# Patient Record
Sex: Female | Born: 1950
Health system: Southern US, Community
[De-identification: ages and names within clinical notes are randomized; demographics above are authoritative.]

## PROBLEM LIST (undated history)

## (undated) DIAGNOSIS — D696 Thrombocytopenia, unspecified: Secondary | ICD-10-CM

## (undated) DIAGNOSIS — M51379 Other intervertebral disc degeneration, lumbosacral region without mention of lumbar back pain or lower extremity pain: Secondary | ICD-10-CM

## (undated) DIAGNOSIS — M5136 Other intervertebral disc degeneration, lumbar region: Secondary | ICD-10-CM

## (undated) DIAGNOSIS — E119 Type 2 diabetes mellitus without complications: Secondary | ICD-10-CM

## (undated) DIAGNOSIS — I10 Essential (primary) hypertension: Secondary | ICD-10-CM

## (undated) DIAGNOSIS — M5137 Other intervertebral disc degeneration, lumbosacral region: Secondary | ICD-10-CM

## (undated) HISTORY — DX: Essential (primary) hypertension: I10

## (undated) HISTORY — PX: WRIST SURGERY: SHX841

## (undated) HISTORY — DX: Other intervertebral disc degeneration, lumbosacral region without mention of lumbar back pain or lower extremity pain: M51.379

## (undated) HISTORY — DX: Other intervertebral disc degeneration, lumbosacral region: M51.37

## (undated) HISTORY — DX: Thrombocytopenia, unspecified: D69.6

## (undated) HISTORY — PX: CARPAL TUNNEL RELEASE: SHX101

## (undated) HISTORY — DX: Type 2 diabetes mellitus without complications: E11.9

---
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
## 1976-08-11 DIAGNOSIS — N809 Endometriosis, unspecified: Secondary | ICD-10-CM

## 1976-08-11 HISTORY — DX: Endometriosis, unspecified: N80.9

## 1980-08-11 DIAGNOSIS — S82899A Other fracture of unspecified lower leg, initial encounter for closed fracture: Secondary | ICD-10-CM

## 1980-08-11 HISTORY — DX: Other fracture of unspecified lower leg, initial encounter for closed fracture: S82.899A

## 1997-08-11 DIAGNOSIS — S62109A Fracture of unspecified carpal bone, unspecified wrist, initial encounter for closed fracture: Secondary | ICD-10-CM

## 1997-08-11 HISTORY — DX: Fracture of unspecified carpal bone, unspecified wrist, initial encounter for closed fracture: S62.109A

## 1998-01-18 ENCOUNTER — Other Ambulatory Visit: Admission: RE | Admit: 1998-01-18 | Discharge: 1998-01-18 | Payer: Self-pay | Admitting: Obstetrics and Gynecology

## 1998-02-13 ENCOUNTER — Inpatient Hospital Stay (HOSPITAL_COMMUNITY): Admission: RE | Admit: 1998-02-13 | Discharge: 1998-02-16 | Payer: Self-pay | Admitting: Obstetrics and Gynecology

## 1998-08-11 DIAGNOSIS — G5601 Carpal tunnel syndrome, right upper limb: Secondary | ICD-10-CM

## 1998-08-11 HISTORY — DX: Carpal tunnel syndrome, right upper limb: G56.01

## 1998-10-11 ENCOUNTER — Other Ambulatory Visit: Admission: RE | Admit: 1998-10-11 | Discharge: 1998-10-11 | Payer: Self-pay | Admitting: Obstetrics and Gynecology

## 1999-06-30 ENCOUNTER — Encounter: Payer: Self-pay | Admitting: Emergency Medicine

## 1999-06-30 ENCOUNTER — Emergency Department (HOSPITAL_COMMUNITY): Admission: EM | Admit: 1999-06-30 | Discharge: 1999-06-30 | Payer: Self-pay | Admitting: Emergency Medicine

## 1999-09-20 ENCOUNTER — Encounter: Admission: RE | Admit: 1999-09-20 | Discharge: 1999-12-19 | Payer: Self-pay | Admitting: Family Medicine

## 1999-09-20 ENCOUNTER — Encounter: Admission: RE | Admit: 1999-09-20 | Discharge: 1999-09-20 | Payer: Self-pay | Admitting: Family Medicine

## 1999-09-20 ENCOUNTER — Encounter: Payer: Self-pay | Admitting: Family Medicine

## 1999-11-20 ENCOUNTER — Other Ambulatory Visit: Admission: RE | Admit: 1999-11-20 | Discharge: 1999-11-20 | Payer: Self-pay | Admitting: Obstetrics and Gynecology

## 2012-01-23 ENCOUNTER — Ambulatory Visit
Admission: RE | Admit: 2012-01-23 | Discharge: 2012-01-23 | Disposition: A | Discharge status: Home / Self Care | Payer: No Typology Code available for payment source | Source: Ambulatory Visit | Attending: Family Medicine | Admitting: Family Medicine

## 2012-01-23 ENCOUNTER — Other Ambulatory Visit: Payer: Self-pay | Admitting: Family Medicine

## 2012-01-23 DIAGNOSIS — R7611 Nonspecific reaction to tuberculin skin test without active tuberculosis: Secondary | ICD-10-CM

## 2013-03-02 ENCOUNTER — Other Ambulatory Visit: Payer: Self-pay | Admitting: Orthopedic Surgery

## 2013-03-02 DIAGNOSIS — M545 Low back pain: Secondary | ICD-10-CM

## 2013-03-09 ENCOUNTER — Other Ambulatory Visit: Payer: Self-pay

## 2013-05-03 ENCOUNTER — Other Ambulatory Visit: Payer: Self-pay | Admitting: Family Medicine

## 2013-05-03 ENCOUNTER — Ambulatory Visit
Admission: RE | Admit: 2013-05-03 | Discharge: 2013-05-03 | Disposition: A | Discharge status: Home / Self Care | Payer: BC Managed Care – PPO | Source: Ambulatory Visit | Attending: Family Medicine | Admitting: Family Medicine

## 2013-05-03 DIAGNOSIS — R7611 Nonspecific reaction to tuberculin skin test without active tuberculosis: Secondary | ICD-10-CM

## 2013-10-21 ENCOUNTER — Encounter (INDEPENDENT_AMBULATORY_CARE_PROVIDER_SITE_OTHER): Payer: BC Managed Care – PPO | Admitting: Ophthalmology

## 2013-10-21 DIAGNOSIS — H35039 Hypertensive retinopathy, unspecified eye: Secondary | ICD-10-CM

## 2013-10-21 DIAGNOSIS — H43819 Vitreous degeneration, unspecified eye: Secondary | ICD-10-CM

## 2013-10-21 DIAGNOSIS — E1165 Type 2 diabetes mellitus with hyperglycemia: Secondary | ICD-10-CM

## 2013-10-21 DIAGNOSIS — E1139 Type 2 diabetes mellitus with other diabetic ophthalmic complication: Secondary | ICD-10-CM

## 2013-10-21 DIAGNOSIS — E11319 Type 2 diabetes mellitus with unspecified diabetic retinopathy without macular edema: Secondary | ICD-10-CM

## 2013-10-21 DIAGNOSIS — H348192 Central retinal vein occlusion, unspecified eye, stable: Secondary | ICD-10-CM

## 2013-10-21 DIAGNOSIS — I10 Essential (primary) hypertension: Secondary | ICD-10-CM

## 2013-11-25 ENCOUNTER — Encounter (INDEPENDENT_AMBULATORY_CARE_PROVIDER_SITE_OTHER): Payer: BC Managed Care – PPO | Admitting: Ophthalmology

## 2013-11-25 DIAGNOSIS — H348192 Central retinal vein occlusion, unspecified eye, stable: Secondary | ICD-10-CM

## 2013-11-25 DIAGNOSIS — E11319 Type 2 diabetes mellitus with unspecified diabetic retinopathy without macular edema: Secondary | ICD-10-CM

## 2013-11-25 DIAGNOSIS — E1139 Type 2 diabetes mellitus with other diabetic ophthalmic complication: Secondary | ICD-10-CM

## 2013-11-25 DIAGNOSIS — H35039 Hypertensive retinopathy, unspecified eye: Secondary | ICD-10-CM

## 2013-11-25 DIAGNOSIS — H43819 Vitreous degeneration, unspecified eye: Secondary | ICD-10-CM

## 2013-11-25 DIAGNOSIS — E1165 Type 2 diabetes mellitus with hyperglycemia: Secondary | ICD-10-CM

## 2013-11-25 DIAGNOSIS — I10 Essential (primary) hypertension: Secondary | ICD-10-CM

## 2013-12-23 ENCOUNTER — Encounter (INDEPENDENT_AMBULATORY_CARE_PROVIDER_SITE_OTHER): Payer: BC Managed Care – PPO | Admitting: Ophthalmology

## 2013-12-23 DIAGNOSIS — H348192 Central retinal vein occlusion, unspecified eye, stable: Secondary | ICD-10-CM

## 2013-12-23 DIAGNOSIS — I10 Essential (primary) hypertension: Secondary | ICD-10-CM

## 2013-12-23 DIAGNOSIS — E11319 Type 2 diabetes mellitus with unspecified diabetic retinopathy without macular edema: Secondary | ICD-10-CM

## 2013-12-23 DIAGNOSIS — H43819 Vitreous degeneration, unspecified eye: Secondary | ICD-10-CM

## 2013-12-23 DIAGNOSIS — E1165 Type 2 diabetes mellitus with hyperglycemia: Secondary | ICD-10-CM

## 2013-12-23 DIAGNOSIS — H35039 Hypertensive retinopathy, unspecified eye: Secondary | ICD-10-CM

## 2013-12-23 DIAGNOSIS — E1139 Type 2 diabetes mellitus with other diabetic ophthalmic complication: Secondary | ICD-10-CM

## 2014-01-30 ENCOUNTER — Encounter (INDEPENDENT_AMBULATORY_CARE_PROVIDER_SITE_OTHER): Payer: BC Managed Care – PPO | Admitting: Ophthalmology

## 2014-01-30 DIAGNOSIS — I10 Essential (primary) hypertension: Secondary | ICD-10-CM

## 2014-01-30 DIAGNOSIS — E11319 Type 2 diabetes mellitus with unspecified diabetic retinopathy without macular edema: Secondary | ICD-10-CM

## 2014-01-30 DIAGNOSIS — E1165 Type 2 diabetes mellitus with hyperglycemia: Secondary | ICD-10-CM

## 2014-01-30 DIAGNOSIS — H348192 Central retinal vein occlusion, unspecified eye, stable: Secondary | ICD-10-CM

## 2014-01-30 DIAGNOSIS — H43819 Vitreous degeneration, unspecified eye: Secondary | ICD-10-CM

## 2014-01-30 DIAGNOSIS — H35039 Hypertensive retinopathy, unspecified eye: Secondary | ICD-10-CM

## 2014-01-30 DIAGNOSIS — E1139 Type 2 diabetes mellitus with other diabetic ophthalmic complication: Secondary | ICD-10-CM

## 2014-03-20 ENCOUNTER — Encounter (INDEPENDENT_AMBULATORY_CARE_PROVIDER_SITE_OTHER): Payer: BC Managed Care – PPO | Admitting: Ophthalmology

## 2014-03-20 DIAGNOSIS — E1165 Type 2 diabetes mellitus with hyperglycemia: Secondary | ICD-10-CM

## 2014-03-20 DIAGNOSIS — E1139 Type 2 diabetes mellitus with other diabetic ophthalmic complication: Secondary | ICD-10-CM

## 2014-03-20 DIAGNOSIS — E11319 Type 2 diabetes mellitus with unspecified diabetic retinopathy without macular edema: Secondary | ICD-10-CM

## 2014-03-20 DIAGNOSIS — H43819 Vitreous degeneration, unspecified eye: Secondary | ICD-10-CM

## 2014-03-20 DIAGNOSIS — H35039 Hypertensive retinopathy, unspecified eye: Secondary | ICD-10-CM

## 2014-03-20 DIAGNOSIS — I10 Essential (primary) hypertension: Secondary | ICD-10-CM

## 2014-03-20 DIAGNOSIS — H348192 Central retinal vein occlusion, unspecified eye, stable: Secondary | ICD-10-CM

## 2014-05-22 ENCOUNTER — Encounter (INDEPENDENT_AMBULATORY_CARE_PROVIDER_SITE_OTHER): Payer: BC Managed Care – PPO | Admitting: Ophthalmology

## 2014-05-22 DIAGNOSIS — I10 Essential (primary) hypertension: Secondary | ICD-10-CM

## 2014-05-22 DIAGNOSIS — E11321 Type 2 diabetes mellitus with mild nonproliferative diabetic retinopathy with macular edema: Secondary | ICD-10-CM

## 2014-05-22 DIAGNOSIS — H34811 Central retinal vein occlusion, right eye: Secondary | ICD-10-CM

## 2014-05-22 DIAGNOSIS — H43813 Vitreous degeneration, bilateral: Secondary | ICD-10-CM

## 2014-05-22 DIAGNOSIS — H35033 Hypertensive retinopathy, bilateral: Secondary | ICD-10-CM

## 2014-05-22 DIAGNOSIS — E11329 Type 2 diabetes mellitus with mild nonproliferative diabetic retinopathy without macular edema: Secondary | ICD-10-CM

## 2014-05-22 DIAGNOSIS — E11311 Type 2 diabetes mellitus with unspecified diabetic retinopathy with macular edema: Secondary | ICD-10-CM

## 2014-08-21 ENCOUNTER — Encounter (INDEPENDENT_AMBULATORY_CARE_PROVIDER_SITE_OTHER): Payer: BC Managed Care – PPO | Admitting: Ophthalmology

## 2014-08-21 DIAGNOSIS — H43813 Vitreous degeneration, bilateral: Secondary | ICD-10-CM

## 2014-08-21 DIAGNOSIS — E11319 Type 2 diabetes mellitus with unspecified diabetic retinopathy without macular edema: Secondary | ICD-10-CM

## 2014-08-21 DIAGNOSIS — I10 Essential (primary) hypertension: Secondary | ICD-10-CM

## 2014-08-21 DIAGNOSIS — E11329 Type 2 diabetes mellitus with mild nonproliferative diabetic retinopathy without macular edema: Secondary | ICD-10-CM

## 2014-08-21 DIAGNOSIS — H35033 Hypertensive retinopathy, bilateral: Secondary | ICD-10-CM

## 2014-08-21 DIAGNOSIS — H34811 Central retinal vein occlusion, right eye: Secondary | ICD-10-CM

## 2014-10-30 ENCOUNTER — Encounter (INDEPENDENT_AMBULATORY_CARE_PROVIDER_SITE_OTHER): Payer: BC Managed Care – PPO | Admitting: Ophthalmology

## 2014-10-30 DIAGNOSIS — H35033 Hypertensive retinopathy, bilateral: Secondary | ICD-10-CM

## 2014-10-30 DIAGNOSIS — E11329 Type 2 diabetes mellitus with mild nonproliferative diabetic retinopathy without macular edema: Secondary | ICD-10-CM | POA: Diagnosis not present

## 2014-10-30 DIAGNOSIS — H43813 Vitreous degeneration, bilateral: Secondary | ICD-10-CM

## 2014-10-30 DIAGNOSIS — I10 Essential (primary) hypertension: Secondary | ICD-10-CM

## 2014-10-30 DIAGNOSIS — E11319 Type 2 diabetes mellitus with unspecified diabetic retinopathy without macular edema: Secondary | ICD-10-CM

## 2014-10-30 DIAGNOSIS — H34811 Central retinal vein occlusion, right eye: Secondary | ICD-10-CM | POA: Diagnosis not present

## 2014-11-09 ENCOUNTER — Other Ambulatory Visit: Payer: Self-pay | Admitting: Family Medicine

## 2014-11-09 ENCOUNTER — Ambulatory Visit
Admission: RE | Admit: 2014-11-09 | Discharge: 2014-11-09 | Disposition: A | Discharge status: Home / Self Care | Payer: BC Managed Care – PPO | Source: Ambulatory Visit | Attending: Family Medicine | Admitting: Family Medicine

## 2014-11-09 DIAGNOSIS — R7611 Nonspecific reaction to tuberculin skin test without active tuberculosis: Secondary | ICD-10-CM

## 2015-01-22 ENCOUNTER — Encounter (INDEPENDENT_AMBULATORY_CARE_PROVIDER_SITE_OTHER): Payer: BC Managed Care – PPO | Admitting: Ophthalmology

## 2015-01-25 ENCOUNTER — Encounter (INDEPENDENT_AMBULATORY_CARE_PROVIDER_SITE_OTHER): Payer: BC Managed Care – PPO | Admitting: Ophthalmology

## 2015-01-26 ENCOUNTER — Encounter (INDEPENDENT_AMBULATORY_CARE_PROVIDER_SITE_OTHER): Payer: BC Managed Care – PPO | Admitting: Ophthalmology

## 2015-01-26 DIAGNOSIS — I10 Essential (primary) hypertension: Secondary | ICD-10-CM | POA: Diagnosis not present

## 2015-01-26 DIAGNOSIS — H43813 Vitreous degeneration, bilateral: Secondary | ICD-10-CM | POA: Diagnosis not present

## 2015-01-26 DIAGNOSIS — E11319 Type 2 diabetes mellitus with unspecified diabetic retinopathy without macular edema: Secondary | ICD-10-CM

## 2015-01-26 DIAGNOSIS — E11329 Type 2 diabetes mellitus with mild nonproliferative diabetic retinopathy without macular edema: Secondary | ICD-10-CM | POA: Diagnosis not present

## 2015-01-26 DIAGNOSIS — H34811 Central retinal vein occlusion, right eye: Secondary | ICD-10-CM

## 2015-01-26 DIAGNOSIS — H35033 Hypertensive retinopathy, bilateral: Secondary | ICD-10-CM | POA: Diagnosis not present

## 2015-06-04 ENCOUNTER — Encounter (INDEPENDENT_AMBULATORY_CARE_PROVIDER_SITE_OTHER): Payer: BC Managed Care – PPO | Admitting: Ophthalmology

## 2015-06-11 ENCOUNTER — Encounter (INDEPENDENT_AMBULATORY_CARE_PROVIDER_SITE_OTHER): Payer: BC Managed Care – PPO | Admitting: Ophthalmology

## 2015-06-11 DIAGNOSIS — I10 Essential (primary) hypertension: Secondary | ICD-10-CM

## 2015-06-11 DIAGNOSIS — E113293 Type 2 diabetes mellitus with mild nonproliferative diabetic retinopathy without macular edema, bilateral: Secondary | ICD-10-CM | POA: Diagnosis not present

## 2015-06-11 DIAGNOSIS — H348311 Tributary (branch) retinal vein occlusion, right eye, with retinal neovascularization: Secondary | ICD-10-CM

## 2015-06-11 DIAGNOSIS — E11319 Type 2 diabetes mellitus with unspecified diabetic retinopathy without macular edema: Secondary | ICD-10-CM | POA: Diagnosis not present

## 2015-06-11 DIAGNOSIS — H35033 Hypertensive retinopathy, bilateral: Secondary | ICD-10-CM | POA: Diagnosis not present

## 2015-10-15 ENCOUNTER — Encounter (INDEPENDENT_AMBULATORY_CARE_PROVIDER_SITE_OTHER): Payer: BC Managed Care – PPO | Admitting: Ophthalmology

## 2015-10-15 DIAGNOSIS — E113391 Type 2 diabetes mellitus with moderate nonproliferative diabetic retinopathy without macular edema, right eye: Secondary | ICD-10-CM | POA: Diagnosis not present

## 2015-10-15 DIAGNOSIS — H348112 Central retinal vein occlusion, right eye, stable: Secondary | ICD-10-CM | POA: Diagnosis not present

## 2015-10-15 DIAGNOSIS — E113292 Type 2 diabetes mellitus with mild nonproliferative diabetic retinopathy without macular edema, left eye: Secondary | ICD-10-CM | POA: Diagnosis not present

## 2015-10-15 DIAGNOSIS — E11319 Type 2 diabetes mellitus with unspecified diabetic retinopathy without macular edema: Secondary | ICD-10-CM | POA: Diagnosis not present

## 2015-12-18 ENCOUNTER — Other Ambulatory Visit: Payer: Self-pay | Admitting: Family Medicine

## 2015-12-18 ENCOUNTER — Ambulatory Visit
Admission: RE | Admit: 2015-12-18 | Discharge: 2015-12-18 | Disposition: A | Discharge status: Home / Self Care | Payer: BC Managed Care – PPO | Source: Ambulatory Visit | Attending: Family Medicine | Admitting: Family Medicine

## 2015-12-18 DIAGNOSIS — E1121 Type 2 diabetes mellitus with diabetic nephropathy: Secondary | ICD-10-CM | POA: Diagnosis not present

## 2015-12-18 DIAGNOSIS — I129 Hypertensive chronic kidney disease with stage 1 through stage 4 chronic kidney disease, or unspecified chronic kidney disease: Secondary | ICD-10-CM | POA: Diagnosis not present

## 2015-12-18 DIAGNOSIS — Z7984 Long term (current) use of oral hypoglycemic drugs: Secondary | ICD-10-CM | POA: Diagnosis not present

## 2015-12-18 DIAGNOSIS — R7611 Nonspecific reaction to tuberculin skin test without active tuberculosis: Secondary | ICD-10-CM

## 2015-12-18 DIAGNOSIS — J309 Allergic rhinitis, unspecified: Secondary | ICD-10-CM | POA: Diagnosis not present

## 2015-12-18 DIAGNOSIS — R918 Other nonspecific abnormal finding of lung field: Secondary | ICD-10-CM | POA: Diagnosis not present

## 2015-12-18 DIAGNOSIS — E669 Obesity, unspecified: Secondary | ICD-10-CM | POA: Diagnosis not present

## 2015-12-18 DIAGNOSIS — E11319 Type 2 diabetes mellitus with unspecified diabetic retinopathy without macular edema: Secondary | ICD-10-CM | POA: Diagnosis not present

## 2015-12-18 DIAGNOSIS — N181 Chronic kidney disease, stage 1: Secondary | ICD-10-CM | POA: Diagnosis not present

## 2015-12-18 DIAGNOSIS — E782 Mixed hyperlipidemia: Secondary | ICD-10-CM | POA: Diagnosis not present

## 2015-12-18 DIAGNOSIS — Z6830 Body mass index (BMI) 30.0-30.9, adult: Secondary | ICD-10-CM | POA: Diagnosis not present

## 2015-12-26 ENCOUNTER — Other Ambulatory Visit: Payer: Self-pay | Admitting: Family Medicine

## 2015-12-26 ENCOUNTER — Ambulatory Visit
Admission: RE | Admit: 2015-12-26 | Discharge: 2015-12-26 | Disposition: A | Discharge status: Home / Self Care | Payer: Medicare Other | Source: Ambulatory Visit | Attending: Family Medicine | Admitting: Family Medicine

## 2015-12-26 DIAGNOSIS — R222 Localized swelling, mass and lump, trunk: Secondary | ICD-10-CM

## 2015-12-26 DIAGNOSIS — R918 Other nonspecific abnormal finding of lung field: Secondary | ICD-10-CM | POA: Diagnosis not present

## 2016-02-04 DIAGNOSIS — H6501 Acute serous otitis media, right ear: Secondary | ICD-10-CM | POA: Diagnosis not present

## 2016-02-04 DIAGNOSIS — H7292 Unspecified perforation of tympanic membrane, left ear: Secondary | ICD-10-CM | POA: Diagnosis not present

## 2016-03-24 ENCOUNTER — Encounter (INDEPENDENT_AMBULATORY_CARE_PROVIDER_SITE_OTHER): Payer: Medicare Other | Admitting: Ophthalmology

## 2016-03-24 DIAGNOSIS — H43813 Vitreous degeneration, bilateral: Secondary | ICD-10-CM

## 2016-03-24 DIAGNOSIS — E113393 Type 2 diabetes mellitus with moderate nonproliferative diabetic retinopathy without macular edema, bilateral: Secondary | ICD-10-CM

## 2016-03-24 DIAGNOSIS — H348112 Central retinal vein occlusion, right eye, stable: Secondary | ICD-10-CM | POA: Diagnosis not present

## 2016-03-24 DIAGNOSIS — H35033 Hypertensive retinopathy, bilateral: Secondary | ICD-10-CM | POA: Diagnosis not present

## 2016-03-24 DIAGNOSIS — I10 Essential (primary) hypertension: Secondary | ICD-10-CM

## 2016-03-24 DIAGNOSIS — E11319 Type 2 diabetes mellitus with unspecified diabetic retinopathy without macular edema: Secondary | ICD-10-CM | POA: Diagnosis not present

## 2016-04-25 DIAGNOSIS — Z803 Family history of malignant neoplasm of breast: Secondary | ICD-10-CM | POA: Diagnosis not present

## 2016-04-25 DIAGNOSIS — Z1231 Encounter for screening mammogram for malignant neoplasm of breast: Secondary | ICD-10-CM | POA: Diagnosis not present

## 2016-06-11 DIAGNOSIS — Z23 Encounter for immunization: Secondary | ICD-10-CM | POA: Diagnosis not present

## 2016-08-18 DIAGNOSIS — M25561 Pain in right knee: Secondary | ICD-10-CM | POA: Diagnosis not present

## 2016-08-22 DIAGNOSIS — M25561 Pain in right knee: Secondary | ICD-10-CM | POA: Diagnosis not present

## 2016-08-25 ENCOUNTER — Encounter (INDEPENDENT_AMBULATORY_CARE_PROVIDER_SITE_OTHER): Payer: Medicare Other | Admitting: Ophthalmology

## 2016-08-25 DIAGNOSIS — M25561 Pain in right knee: Secondary | ICD-10-CM | POA: Diagnosis not present

## 2016-09-03 DIAGNOSIS — M5136 Other intervertebral disc degeneration, lumbar region: Secondary | ICD-10-CM | POA: Diagnosis not present

## 2016-09-04 DIAGNOSIS — M5136 Other intervertebral disc degeneration, lumbar region: Secondary | ICD-10-CM | POA: Diagnosis not present

## 2016-09-24 DIAGNOSIS — M25561 Pain in right knee: Secondary | ICD-10-CM | POA: Diagnosis not present

## 2016-10-22 DIAGNOSIS — R262 Difficulty in walking, not elsewhere classified: Secondary | ICD-10-CM | POA: Diagnosis not present

## 2016-10-22 DIAGNOSIS — M25561 Pain in right knee: Secondary | ICD-10-CM | POA: Diagnosis not present

## 2016-10-28 DIAGNOSIS — M25561 Pain in right knee: Secondary | ICD-10-CM | POA: Diagnosis not present

## 2016-10-28 DIAGNOSIS — R262 Difficulty in walking, not elsewhere classified: Secondary | ICD-10-CM | POA: Diagnosis not present

## 2016-10-30 DIAGNOSIS — R262 Difficulty in walking, not elsewhere classified: Secondary | ICD-10-CM | POA: Diagnosis not present

## 2016-10-30 DIAGNOSIS — M25561 Pain in right knee: Secondary | ICD-10-CM | POA: Diagnosis not present

## 2016-11-04 DIAGNOSIS — M25561 Pain in right knee: Secondary | ICD-10-CM | POA: Diagnosis not present

## 2016-11-04 DIAGNOSIS — R262 Difficulty in walking, not elsewhere classified: Secondary | ICD-10-CM | POA: Diagnosis not present

## 2016-11-06 DIAGNOSIS — M25561 Pain in right knee: Secondary | ICD-10-CM | POA: Diagnosis not present

## 2016-11-06 DIAGNOSIS — R262 Difficulty in walking, not elsewhere classified: Secondary | ICD-10-CM | POA: Diagnosis not present

## 2016-11-11 DIAGNOSIS — M25561 Pain in right knee: Secondary | ICD-10-CM | POA: Diagnosis not present

## 2016-11-11 DIAGNOSIS — R262 Difficulty in walking, not elsewhere classified: Secondary | ICD-10-CM | POA: Diagnosis not present

## 2016-11-18 DIAGNOSIS — Z1159 Encounter for screening for other viral diseases: Secondary | ICD-10-CM | POA: Diagnosis not present

## 2016-11-18 DIAGNOSIS — R262 Difficulty in walking, not elsewhere classified: Secondary | ICD-10-CM | POA: Diagnosis not present

## 2016-11-18 DIAGNOSIS — M25561 Pain in right knee: Secondary | ICD-10-CM | POA: Diagnosis not present

## 2016-11-19 DIAGNOSIS — M25561 Pain in right knee: Secondary | ICD-10-CM | POA: Diagnosis not present

## 2016-11-25 DIAGNOSIS — R262 Difficulty in walking, not elsewhere classified: Secondary | ICD-10-CM | POA: Diagnosis not present

## 2016-11-25 DIAGNOSIS — M25561 Pain in right knee: Secondary | ICD-10-CM | POA: Diagnosis not present

## 2016-11-26 DIAGNOSIS — M25561 Pain in right knee: Secondary | ICD-10-CM | POA: Diagnosis not present

## 2016-11-26 DIAGNOSIS — R262 Difficulty in walking, not elsewhere classified: Secondary | ICD-10-CM | POA: Diagnosis not present

## 2016-12-02 DIAGNOSIS — M25561 Pain in right knee: Secondary | ICD-10-CM | POA: Diagnosis not present

## 2016-12-02 DIAGNOSIS — R262 Difficulty in walking, not elsewhere classified: Secondary | ICD-10-CM | POA: Diagnosis not present

## 2016-12-04 DIAGNOSIS — R262 Difficulty in walking, not elsewhere classified: Secondary | ICD-10-CM | POA: Diagnosis not present

## 2016-12-04 DIAGNOSIS — M25561 Pain in right knee: Secondary | ICD-10-CM | POA: Diagnosis not present

## 2016-12-16 DIAGNOSIS — M810 Age-related osteoporosis without current pathological fracture: Secondary | ICD-10-CM | POA: Diagnosis not present

## 2016-12-16 DIAGNOSIS — M8588 Other specified disorders of bone density and structure, other site: Secondary | ICD-10-CM | POA: Diagnosis not present

## 2016-12-17 DIAGNOSIS — M25561 Pain in right knee: Secondary | ICD-10-CM | POA: Diagnosis not present

## 2017-02-10 DIAGNOSIS — J309 Allergic rhinitis, unspecified: Secondary | ICD-10-CM | POA: Diagnosis not present

## 2017-03-03 DIAGNOSIS — J329 Chronic sinusitis, unspecified: Secondary | ICD-10-CM | POA: Diagnosis not present

## 2017-04-02 ENCOUNTER — Encounter (INDEPENDENT_AMBULATORY_CARE_PROVIDER_SITE_OTHER): Payer: Medicare Other | Admitting: Ophthalmology

## 2017-04-02 DIAGNOSIS — H348112 Central retinal vein occlusion, right eye, stable: Secondary | ICD-10-CM | POA: Diagnosis not present

## 2017-04-02 DIAGNOSIS — E113391 Type 2 diabetes mellitus with moderate nonproliferative diabetic retinopathy without macular edema, right eye: Secondary | ICD-10-CM

## 2017-04-02 DIAGNOSIS — H43813 Vitreous degeneration, bilateral: Secondary | ICD-10-CM

## 2017-04-02 DIAGNOSIS — I10 Essential (primary) hypertension: Secondary | ICD-10-CM

## 2017-04-02 DIAGNOSIS — E11319 Type 2 diabetes mellitus with unspecified diabetic retinopathy without macular edema: Secondary | ICD-10-CM

## 2017-04-02 DIAGNOSIS — H35033 Hypertensive retinopathy, bilateral: Secondary | ICD-10-CM

## 2017-04-06 ENCOUNTER — Encounter (INDEPENDENT_AMBULATORY_CARE_PROVIDER_SITE_OTHER): Payer: Medicare Other | Admitting: Ophthalmology

## 2017-05-12 DIAGNOSIS — Z1231 Encounter for screening mammogram for malignant neoplasm of breast: Secondary | ICD-10-CM | POA: Diagnosis not present

## 2017-05-19 DIAGNOSIS — D126 Benign neoplasm of colon, unspecified: Secondary | ICD-10-CM | POA: Diagnosis not present

## 2017-05-19 DIAGNOSIS — K621 Rectal polyp: Secondary | ICD-10-CM | POA: Diagnosis not present

## 2017-05-19 DIAGNOSIS — Z86010 Personal history of colonic polyps: Secondary | ICD-10-CM | POA: Diagnosis not present

## 2017-05-21 ENCOUNTER — Other Ambulatory Visit: Payer: Self-pay | Admitting: Family Medicine

## 2017-05-21 ENCOUNTER — Ambulatory Visit
Admission: RE | Admit: 2017-05-21 | Discharge: 2017-05-21 | Disposition: A | Discharge status: Home / Self Care | Payer: Medicare Other | Source: Ambulatory Visit | Attending: Family Medicine | Admitting: Family Medicine

## 2017-05-21 DIAGNOSIS — I129 Hypertensive chronic kidney disease with stage 1 through stage 4 chronic kidney disease, or unspecified chronic kidney disease: Secondary | ICD-10-CM | POA: Diagnosis not present

## 2017-05-21 DIAGNOSIS — R05 Cough: Secondary | ICD-10-CM

## 2017-05-21 DIAGNOSIS — N181 Chronic kidney disease, stage 1: Secondary | ICD-10-CM | POA: Diagnosis not present

## 2017-05-21 DIAGNOSIS — E669 Obesity, unspecified: Secondary | ICD-10-CM | POA: Diagnosis not present

## 2017-05-21 DIAGNOSIS — D126 Benign neoplasm of colon, unspecified: Secondary | ICD-10-CM | POA: Diagnosis not present

## 2017-05-21 DIAGNOSIS — R059 Cough, unspecified: Secondary | ICD-10-CM

## 2017-05-21 DIAGNOSIS — E1121 Type 2 diabetes mellitus with diabetic nephropathy: Secondary | ICD-10-CM | POA: Diagnosis not present

## 2017-05-21 DIAGNOSIS — R809 Proteinuria, unspecified: Secondary | ICD-10-CM | POA: Diagnosis not present

## 2017-05-21 DIAGNOSIS — J309 Allergic rhinitis, unspecified: Secondary | ICD-10-CM | POA: Diagnosis not present

## 2017-05-21 DIAGNOSIS — Z794 Long term (current) use of insulin: Secondary | ICD-10-CM | POA: Diagnosis not present

## 2017-05-21 DIAGNOSIS — E782 Mixed hyperlipidemia: Secondary | ICD-10-CM | POA: Diagnosis not present

## 2017-05-28 DIAGNOSIS — H7292 Unspecified perforation of tympanic membrane, left ear: Secondary | ICD-10-CM | POA: Diagnosis not present

## 2017-05-28 DIAGNOSIS — J0191 Acute recurrent sinusitis, unspecified: Secondary | ICD-10-CM | POA: Diagnosis not present

## 2017-05-28 DIAGNOSIS — H6504 Acute serous otitis media, recurrent, right ear: Secondary | ICD-10-CM | POA: Diagnosis not present

## 2017-06-05 DIAGNOSIS — R740 Nonspecific elevation of levels of transaminase and lactic acid dehydrogenase [LDH]: Secondary | ICD-10-CM | POA: Diagnosis not present

## 2017-06-05 DIAGNOSIS — R945 Abnormal results of liver function studies: Secondary | ICD-10-CM | POA: Diagnosis not present

## 2017-06-29 DIAGNOSIS — H7292 Unspecified perforation of tympanic membrane, left ear: Secondary | ICD-10-CM | POA: Diagnosis not present

## 2017-06-29 DIAGNOSIS — H6504 Acute serous otitis media, recurrent, right ear: Secondary | ICD-10-CM | POA: Diagnosis not present

## 2017-08-13 DIAGNOSIS — J309 Allergic rhinitis, unspecified: Secondary | ICD-10-CM | POA: Diagnosis not present

## 2017-08-13 DIAGNOSIS — H9190 Unspecified hearing loss, unspecified ear: Secondary | ICD-10-CM | POA: Diagnosis not present

## 2017-08-16 IMAGING — CR DG CHEST 2V
2 series · 2 of 2 positions shown · non-contrast
Comparison: 11/09/2014 and 05/03/2013

CLINICAL DATA: History of positive PPD.  No current chest symptoms.

EXAM:
CHEST  2 VIEW

[view not recorded (1 of 2)]
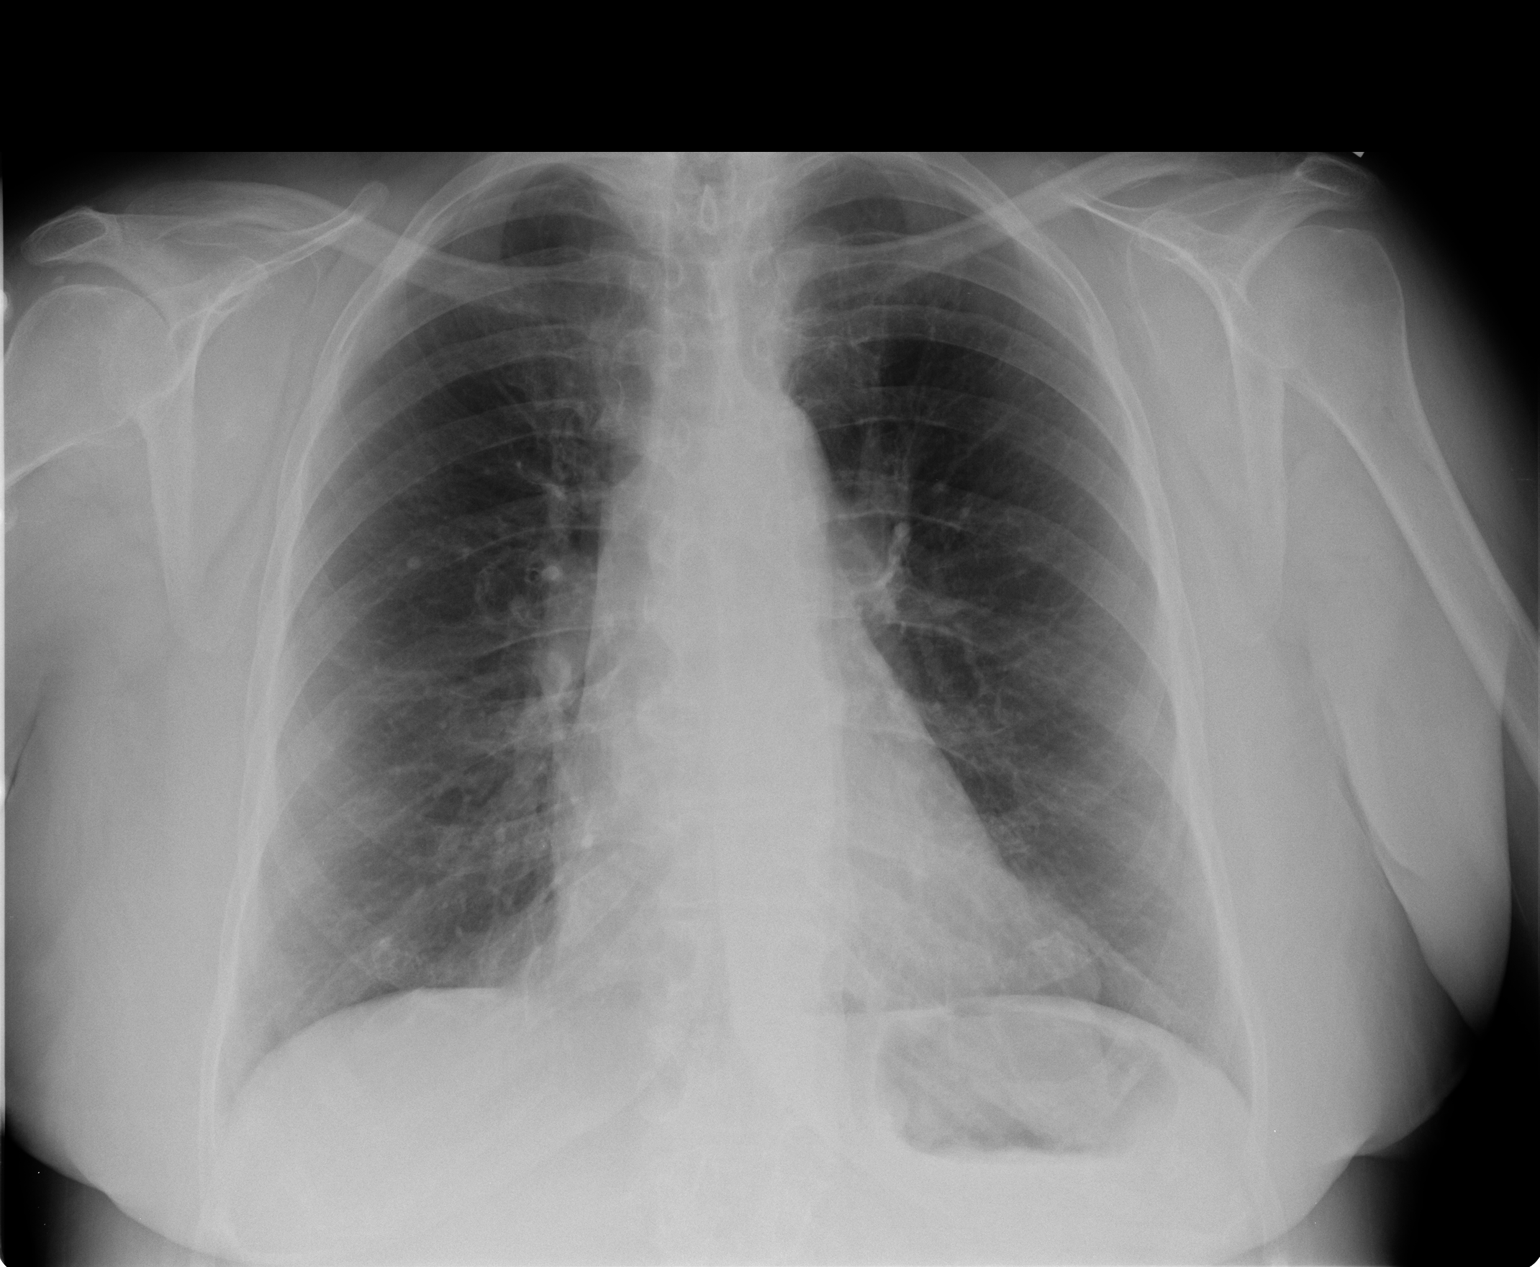

[view not recorded (2 of 2)]
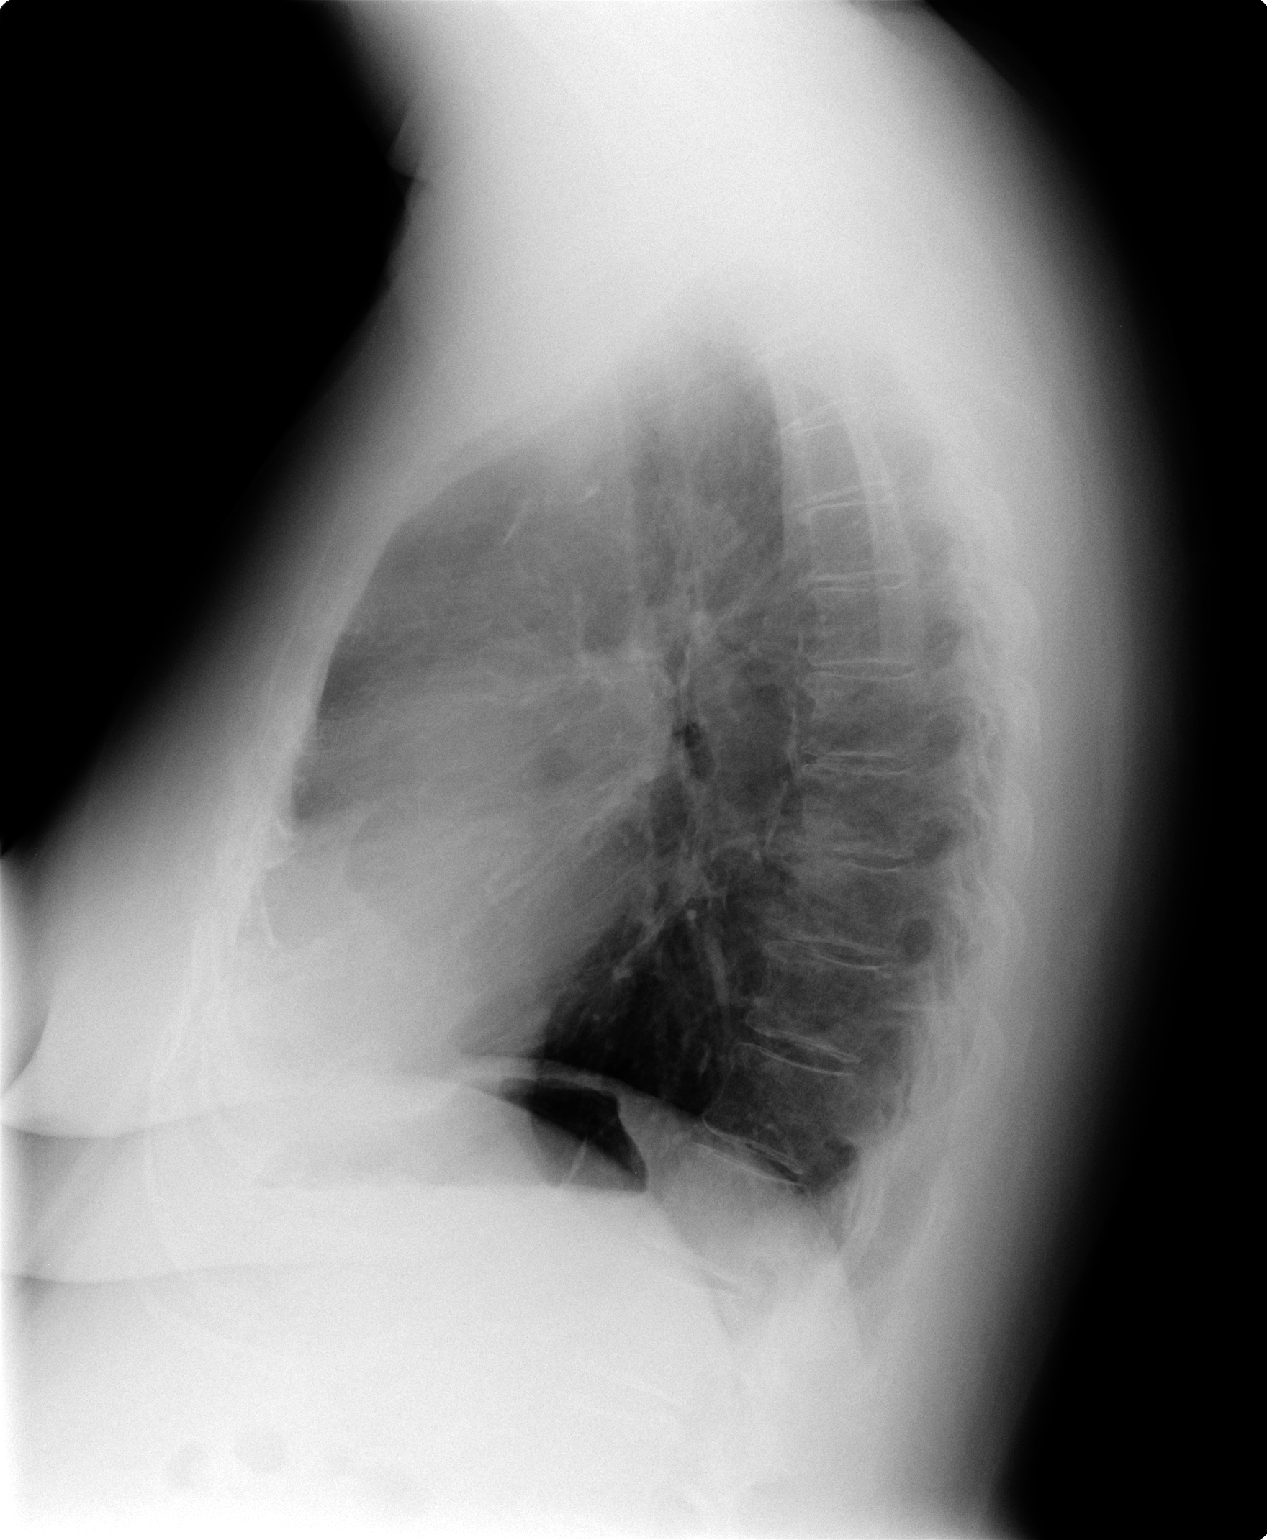

[2 of 2 positions shown; findings below may reference images not displayed]

FINDINGS: Lungs are adequately inflated with stable 3-4 mm dense nodule over
the right midlung likely calcified granuloma. Nodular density
projected along the left heart border just above the hemidiaphragm
not seen on the lateral film and likely related to the anterior
chest wall. Cardiomediastinal silhouette is within normal. There is
mild calcified plaque over the thoracic aorta. There are
degenerative changes of the spine.
IMPRESSION: No acute cardiopulmonary disease.

Nodular density over the left base not seen on the lateral film and
likely related to the anterior chest wall. Consider noncontrast
chest CT versus repeat chest radiograph for further evaluation.

## 2017-09-02 ENCOUNTER — Encounter (INDEPENDENT_AMBULATORY_CARE_PROVIDER_SITE_OTHER): Payer: Medicare Other | Admitting: Ophthalmology

## 2017-09-10 DIAGNOSIS — H6982 Other specified disorders of Eustachian tube, left ear: Secondary | ICD-10-CM | POA: Diagnosis not present

## 2017-09-10 DIAGNOSIS — H903 Sensorineural hearing loss, bilateral: Secondary | ICD-10-CM | POA: Diagnosis not present

## 2017-09-10 DIAGNOSIS — H7202 Central perforation of tympanic membrane, left ear: Secondary | ICD-10-CM | POA: Diagnosis not present

## 2017-09-15 DIAGNOSIS — Z23 Encounter for immunization: Secondary | ICD-10-CM | POA: Diagnosis not present

## 2017-12-11 DIAGNOSIS — E11319 Type 2 diabetes mellitus with unspecified diabetic retinopathy without macular edema: Secondary | ICD-10-CM | POA: Diagnosis not present

## 2017-12-11 DIAGNOSIS — E669 Obesity, unspecified: Secondary | ICD-10-CM | POA: Diagnosis not present

## 2017-12-11 DIAGNOSIS — N181 Chronic kidney disease, stage 1: Secondary | ICD-10-CM | POA: Diagnosis not present

## 2017-12-11 DIAGNOSIS — Z23 Encounter for immunization: Secondary | ICD-10-CM | POA: Diagnosis not present

## 2017-12-11 DIAGNOSIS — E782 Mixed hyperlipidemia: Secondary | ICD-10-CM | POA: Diagnosis not present

## 2017-12-11 DIAGNOSIS — E1121 Type 2 diabetes mellitus with diabetic nephropathy: Secondary | ICD-10-CM | POA: Diagnosis not present

## 2017-12-11 DIAGNOSIS — R809 Proteinuria, unspecified: Secondary | ICD-10-CM | POA: Diagnosis not present

## 2017-12-11 DIAGNOSIS — E559 Vitamin D deficiency, unspecified: Secondary | ICD-10-CM | POA: Diagnosis not present

## 2017-12-11 DIAGNOSIS — H9190 Unspecified hearing loss, unspecified ear: Secondary | ICD-10-CM | POA: Diagnosis not present

## 2017-12-11 DIAGNOSIS — I129 Hypertensive chronic kidney disease with stage 1 through stage 4 chronic kidney disease, or unspecified chronic kidney disease: Secondary | ICD-10-CM | POA: Diagnosis not present

## 2017-12-11 DIAGNOSIS — Z0000 Encounter for general adult medical examination without abnormal findings: Secondary | ICD-10-CM | POA: Diagnosis not present

## 2017-12-11 DIAGNOSIS — M810 Age-related osteoporosis without current pathological fracture: Secondary | ICD-10-CM | POA: Diagnosis not present

## 2018-05-20 DIAGNOSIS — R922 Inconclusive mammogram: Secondary | ICD-10-CM | POA: Diagnosis not present

## 2018-06-16 ENCOUNTER — Ambulatory Visit
Admission: RE | Admit: 2018-06-16 | Discharge: 2018-06-16 | Disposition: A | Discharge status: Home / Self Care | Payer: Medicare Other | Source: Ambulatory Visit | Attending: Family Medicine | Admitting: Family Medicine

## 2018-06-16 ENCOUNTER — Other Ambulatory Visit: Payer: Self-pay | Admitting: Family Medicine

## 2018-06-16 DIAGNOSIS — R7611 Nonspecific reaction to tuberculin skin test without active tuberculosis: Secondary | ICD-10-CM | POA: Diagnosis not present

## 2018-06-16 DIAGNOSIS — Z794 Long term (current) use of insulin: Secondary | ICD-10-CM | POA: Diagnosis not present

## 2018-06-16 DIAGNOSIS — E559 Vitamin D deficiency, unspecified: Secondary | ICD-10-CM | POA: Diagnosis not present

## 2018-06-16 DIAGNOSIS — R918 Other nonspecific abnormal finding of lung field: Secondary | ICD-10-CM | POA: Diagnosis not present

## 2018-06-16 DIAGNOSIS — E1121 Type 2 diabetes mellitus with diabetic nephropathy: Secondary | ICD-10-CM | POA: Diagnosis not present

## 2018-06-16 DIAGNOSIS — Z7984 Long term (current) use of oral hypoglycemic drugs: Secondary | ICD-10-CM | POA: Diagnosis not present

## 2018-06-16 DIAGNOSIS — Z23 Encounter for immunization: Secondary | ICD-10-CM | POA: Diagnosis not present

## 2018-06-16 DIAGNOSIS — I129 Hypertensive chronic kidney disease with stage 1 through stage 4 chronic kidney disease, or unspecified chronic kidney disease: Secondary | ICD-10-CM | POA: Diagnosis not present

## 2018-06-16 DIAGNOSIS — N181 Chronic kidney disease, stage 1: Secondary | ICD-10-CM | POA: Diagnosis not present

## 2018-06-16 DIAGNOSIS — E782 Mixed hyperlipidemia: Secondary | ICD-10-CM | POA: Diagnosis not present

## 2019-10-14 ENCOUNTER — Ambulatory Visit
Admission: RE | Admit: 2019-10-14 | Discharge: 2019-10-14 | Disposition: A | Discharge status: Home / Self Care | Payer: Medicare PPO | Source: Ambulatory Visit | Attending: Family Medicine | Admitting: Family Medicine

## 2019-10-14 ENCOUNTER — Other Ambulatory Visit: Payer: Self-pay | Admitting: Family Medicine

## 2019-10-14 DIAGNOSIS — Z111 Encounter for screening for respiratory tuberculosis: Secondary | ICD-10-CM

## 2019-10-26 ENCOUNTER — Other Ambulatory Visit (HOSPITAL_COMMUNITY): Payer: Self-pay | Admitting: Family Medicine

## 2019-10-26 DIAGNOSIS — R011 Cardiac murmur, unspecified: Secondary | ICD-10-CM

## 2019-11-02 ENCOUNTER — Telehealth: Payer: Self-pay | Admitting: Physician Assistant

## 2019-11-02 NOTE — Telephone Encounter (Signed)
Received a new hem referral from Dr. Brigitte Pulse for thrombocytopenia.Ms. Denise Klein has been cld and scheduled to see Cassie on 4/8 at 1pm w/labs at 1230pm. Pt aware to arrive 15 minutes early.

## 2019-11-10 ENCOUNTER — Ambulatory Visit (HOSPITAL_COMMUNITY): Payer: Medicare PPO | Attending: Internal Medicine

## 2019-11-10 ENCOUNTER — Other Ambulatory Visit: Payer: Self-pay

## 2019-11-10 DIAGNOSIS — R011 Cardiac murmur, unspecified: Secondary | ICD-10-CM

## 2019-11-17 ENCOUNTER — Inpatient Hospital Stay: Payer: Medicare PPO

## 2019-11-17 ENCOUNTER — Telehealth: Payer: Self-pay | Admitting: Physician Assistant

## 2019-11-17 ENCOUNTER — Encounter: Payer: Self-pay | Admitting: Physician Assistant

## 2019-11-17 ENCOUNTER — Inpatient Hospital Stay: Payer: Medicare PPO | Attending: Physician Assistant | Admitting: Physician Assistant

## 2019-11-17 ENCOUNTER — Other Ambulatory Visit: Payer: Self-pay

## 2019-11-17 ENCOUNTER — Other Ambulatory Visit: Payer: Self-pay | Admitting: Physician Assistant

## 2019-11-17 VITALS — BP 148/80 | HR 68 | Temp 98.9°F | Resp 17 | Ht 64.0 in | Wt 178.6 lb

## 2019-11-17 DIAGNOSIS — D696 Thrombocytopenia, unspecified: Secondary | ICD-10-CM | POA: Diagnosis present

## 2019-11-17 LAB — CMP (CANCER CENTER ONLY)
ALT: 35 U/L (ref 0–44)
AST: 35 U/L (ref 15–41)
Albumin: 3.5 g/dL (ref 3.5–5.0)
Alkaline Phosphatase: 84 U/L (ref 38–126)
Anion gap: 8 (ref 5–15)
BUN: 10 mg/dL (ref 8–23)
CO2: 26 mmol/L (ref 22–32)
Calcium: 9 mg/dL (ref 8.9–10.3)
Chloride: 109 mmol/L (ref 98–111)
Creatinine: 0.67 mg/dL (ref 0.44–1.00)
GFR, Est AFR Am: >60 mL/min (ref >60)
GFR, Estimated: >60 mL/min (ref >60)
Glucose, Bld: 137 mg/dL — ABNORMAL HIGH (ref 70–99)
Potassium: 3.7 mmol/L (ref 3.5–5.1)
Sodium: 143 mmol/L (ref 135–145)
Total Bilirubin: 1.3 mg/dL — ABNORMAL HIGH (ref 0.3–1.2)
Total Protein: 6.5 g/dL (ref 6.5–8.1)

## 2019-11-17 LAB — LACTATE DEHYDROGENASE: LDH: 209 U/L — ABNORMAL HIGH (ref 98–192)

## 2019-11-17 LAB — CBC WITH DIFFERENTIAL (CANCER CENTER ONLY)
Abs Immature Granulocytes: 0.01 10*3/uL (ref 0.00–0.07)
Basophils Absolute: 0 10*3/uL (ref 0.0–0.1)
Basophils Relative: 1 %
Eosinophils Absolute: 0.2 10*3/uL (ref 0.0–0.5)
Eosinophils Relative: 4 %
HCT: 37.4 % (ref 36.0–46.0)
Hemoglobin: 12.7 g/dL (ref 12.0–15.0)
Immature Granulocytes: 0 %
Lymphocytes Relative: 30 %
Lymphs Abs: 1.4 10*3/uL (ref 0.7–4.0)
MCH: 31.8 pg (ref 26.0–34.0)
MCHC: 34 g/dL (ref 30.0–36.0)
MCV: 93.7 fL (ref 80.0–100.0)
Monocytes Absolute: 0.5 10*3/uL (ref 0.1–1.0)
Monocytes Relative: 10 %
Neutro Abs: 2.5 10*3/uL (ref 1.7–7.7)
Neutrophils Relative %: 55 %
Platelet Count: 93 10*3/uL — ABNORMAL LOW (ref 150–400)
RBC: 3.99 MIL/uL (ref 3.87–5.11)
RDW: 13.7 % (ref 11.5–15.5)
WBC Count: 4.6 10*3/uL (ref 4.0–10.5)
nRBC: 0 % (ref 0.0–0.2)

## 2019-11-17 NOTE — Telephone Encounter (Signed)
Scheduled appt per 4/8 los.  Printed calendar and avs. 

## 2019-11-17 NOTE — Progress Notes (Signed)
[facility] Telephone:(336) [phone]   Fax:(336) [phone]  CONSULT NOTE  REFERRING PHYSICIAN: Dr. [name]  REASON FOR CONSULTATION:  Thrombocytopenia   HPI CARON ODE is a 69 y.o. female with a past medical history significant for diabetes, hypertension, macular degeneration, obesity, osteopenia, allergic rhinitis, vitamin D deficiency, degenerative disc disease, and sinusitis is referred to the clinic for evaluation of thrombocytopenia.  In March 2021, the patient had a routine follow-up visit with her primary care provider for a diabetes management follow-up.  At that visit, the patient was reportedly feeling in her normal state of health.  A CBC was performed at that time which noted a platelet count at 107k.  She had a repeat CBC 1 week later which showed persistent thrombocytopenia with a platelet count of 114 K. To her knowledge, this is her first time having low platelets on blood work. She also had HIV testing and hepatitis testing which was unremarkable.  She was subsequently referred to the clinic today for further evaluation and management regarding the thrombocytopenia  Today, the patient mas multiple chronic concerns today that are being managed that are likely unrelated to her referral today. She reports fatigue.  She denies any recent viral infections.  She denies any recent fever, chills, night sweats, weight loss.  She denies any nasal congestion, sore throat, nausea, vomiting, cough, abdominal pain, diarrhea, dysuria, or skin infections. She reports dyspnea on exertion. She denies any over-the-counter or herbal supplements.  She denies any excessive Tylenol, ibuprofen, or aspirin use.  She denies any new medications started around the time her thrombocytopenia was noted.  She denies any abnormal bleeding or bruising including epistaxis, gingival bleeding, hemoptysis, hematemesis, melena, hematochezia, or hematuria.  She denies any history of liver  disease although a US of the abdomen from 2004 notes fatty infiltration of the liver.  She denies any early satiety or abdominal fullness.  She denies any history of any autoimmune diseases.  She denies any history of any vitamin deficiencies except for vitamin D.  She denies any particular dietary habits such as being a vegan or vegetarian.  The patient's family history is significant for mother with diabetes, dementia, and hypertension.  The patient's father had hypertension, diabetes mellitus, BPH, and rotator cuff surgery.  The patient has a sister who recently underwent a CABG.  The patient is retired having worked for the [employer].  She is married and has 3 children.  She denies any smoking, alcohol, or drug abuse/ use.   HPI  Past Medical History:  Diagnosis Date  . Broken ankle 1982   right  . Broken wrist 1999   left (external fixator rod)  . Carpal tunnel syndrome, right 2000  . Endometriosis 1978      No family history on file.  Social History Social History   Tobacco Use  . Smoking status: Not on file  Substance Use Topics  . Alcohol use: Not on file  . Drug use: Not on file    Allergies  Allergen Reactions  . Acetaminophen Other (See Comments)    Liver affected  . Ibuprofen Other (See Comments)    Liver affected  . Naproxen Sodium Other (See Comments)    Liver affected  . Pentazocine Other (See Comments)    hallucinations  . Penicillin G Rash  . Sulfamethoxazole Rash    Current Outpatient Medications  Medication Sig Dispense Refill  . colesevelam (WELCHOL) 625 MG tablet Take 3 tablets by mouth  2 (two) times daily.    . insulin degludec (TRESIBA FLEXTOUCH) 100 UNIT/ML FlexTouch Pen Inject 167 Units into the skin daily.    . metFORMIN (GLUCOPHAGE-XR) 500 MG 24 hr tablet Take 2 tablets by mouth daily.    . metoprolol succinate (TOPROL-XL) 100 MG 24 hr tablet Take 1 tablet by mouth daily.    . rosuvastatin (CRESTOR) 20 MG tablet Take 1  tablet by mouth daily.    Marland Kitchen telmisartan-hydrochlorothiazide (MICARDIS HCT) 80-12.5 MG tablet Take 2 tablets by mouth daily.    Marland Kitchen triamcinolone ointment (KENALOG) 0.5 % Apply 1 application topically 2 (two) times daily.     No current facility-administered medications for this visit.    Review of Systems REVIEW OF SYSTEMS:   Review of Systems  Constitutional: Positive for fatigue. Negative for appetite change, chills,  fever and unexpected weight change.  HENT: Negative for mouth sores, nosebleeds, sore throat and trouble swallowing.   Eyes: Negative for eye problems and icterus.  Respiratory: Positive for dyspnea on exertion. Negative for cough, hemoptysis, and wheezing.   Cardiovascular: Negative for chest pain and leg swelling.  Gastrointestinal: Negative for abdominal pain, constipation, diarrhea, nausea and vomiting.  Genitourinary: Negative for bladder incontinence, difficulty urinating, dysuria, frequency and hematuria.   Musculoskeletal: Positive for chronic back pain and left shoulder pain. Negative for gait problem, neck pain and neck stiffness.  Skin: Negative for itching and rash.  Neurological: Positive for occasional headaches. Negative for dizziness, extremity weakness, gait problem, light-headedness and seizures.  Hematological: Negative for adenopathy. Does not bruise/bleed easily.  Psychiatric/Behavioral: Negative for confusion, depression and sleep disturbance. The patient is not nervous/anxious.     PHYSICAL EXAMINATION:  Blood pressure (!) 148/80, pulse 68, temperature 98.9 F (37.2 C), temperature source Temporal, resp. rate 17, height _0  (1.626 m), weight 178 lb 9.6 oz (81 kg), SpO2 99 %.  ECOG PERFORMANCE STATUS: 0  Physical Exam  Constitutional: Oriented to person, place, and time and well-developed, well-nourished, and in no distress.  HENT:  Head: Normocephalic and atraumatic.  Mouth/Throat: Oropharynx is clear and moist. No oropharyngeal exudate.    Eyes: Conjunctivae are normal. Right eye exhibits no discharge. Left eye exhibits no discharge. No scleral icterus.  Neck: Normal range of motion. Neck supple.  Cardiovascular: Normal rate, regular rhythm, normal heart sounds and intact distal pulses.   Pulmonary/Chest: Effort normal and breath sounds normal. No respiratory distress. No wheezes. No rales.  Abdominal: Soft. Bowel sounds are normal. Exhibits no distension and no mass. There is no tenderness.  Musculoskeletal: Normal range of motion. Exhibits no edema.  Lymphadenopathy:    No cervical adenopathy.  Neurological: Alert and oriented to person, place, and time. Exhibits normal muscle tone. Gait normal. Coordination normal.  Skin: Skin is warm and dry. No rash noted. Not diaphoretic. No erythema. No pallor.  Psychiatric: Mood, memory and judgment normal.  Vitals reviewed.  LABORATORY DATA: Lab Results  Component Value Date   WBC 4.6 11/17/2019   HGB 12.7 11/17/2019   HCT 37.4 11/17/2019   MCV 93.7 11/17/2019   PLT 93 (L) 11/17/2019      Chemistry      Component Value Date/Time   NA 143 11/17/2019 1216   K 3.7 11/17/2019 1216   CL 109 11/17/2019 1216   CO2 26 11/17/2019 1216   BUN 10 11/17/2019 1216   CREATININE 0.67 11/17/2019 1216      Component Value Date/Time   CALCIUM 9.0 11/17/2019 1216   ALKPHOS 84 11/17/2019  1216   AST 35 11/17/2019 1216   ALT 35 11/17/2019 1216   BILITOT 1.3 (H) 11/17/2019 1216       RADIOGRAPHIC STUDIES: ECHOCARDIOGRAM COMPLETE  Result Date: 11/11/2019    ECHOCARDIOGRAM REPORT   Patient Name:   MARYCLAIRE STOECKER Date of Exam: 11/10/2019 Medical Rec #:  [id number]        Height:       64.0 in Accession #:    [id number]       Weight:       177.0 lb Date of Birth:  Sep 19, 1950        BSA:          1.857 m Patient Age:    70 years         BP:           163/79 mmHg Patient Gender: F                HR:           69 bpm. Exam Location:  [address] Procedure: 2D Echo, Cardiac Doppler and Color  Doppler Indications:    R01.1 Murmur  History:        Patient has no prior history of Echocardiogram examinations.  Sonographer:    Wilford Sports Rodgers-Jones RDCS Referring Phys: [place]  1. Left ventricular ejection fraction, by estimation, is 60 to 65%. The left ventricle has normal function. The left ventricle has no regional wall motion abnormalities. There is mild left ventricular hypertrophy. Left ventricular diastolic parameters are consistent with Grade I diastolic dysfunction (impaired relaxation).  2. Right ventricular systolic function is normal. The right ventricular size is normal. Tricuspid regurgitation signal is inadequate for assessing PA pressure.  3. The mitral valve is normal in structure. Trivial mitral valve regurgitation. No evidence of mitral stenosis.  4. The aortic valve is abnormal. Aortic valve regurgitation is trivial. Mild to moderate aortic valve sclerosis/calcification is present, without any evidence of aortic stenosis.  5. The inferior vena cava is normal in size with greater than 50% respiratory variability, suggesting right atrial pressure of 3 mmHg. FINDINGS  Left Ventricle: Left ventricular ejection fraction, by estimation, is 60 to 65%. The left ventricle has normal function. The left ventricle has no regional wall motion abnormalities. The left ventricular internal cavity size was normal in size. There is  mild left ventricular hypertrophy. Left ventricular diastolic parameters are consistent with Grade I diastolic dysfunction (impaired relaxation). Right Ventricle: The right ventricular size is normal. No increase in right ventricular wall thickness. Right ventricular systolic function is normal. Tricuspid regurgitation signal is inadequate for assessing PA pressure. Left Atrium: Left atrial size was normal in size. Right Atrium: Right atrial size was normal in size. Pericardium: There is no evidence of pericardial effusion. Mitral Valve: The mitral valve  is normal in structure. Normal mobility of the mitral valve leaflets. Mild mitral annular calcification. Trivial mitral valve regurgitation. No evidence of mitral valve stenosis. Tricuspid Valve: The tricuspid valve is normal in structure. Tricuspid valve regurgitation is not demonstrated. No evidence of tricuspid stenosis. Aortic Valve: The aortic valve is abnormal. Aortic valve regurgitation is trivial. Mild to moderate aortic valve sclerosis/calcification is present, without any evidence of aortic stenosis. There is mild calcification of the aortic valve. Aortic valve mean gradient measures 6.0 mmHg. Pulmonic Valve: The pulmonic valve was normal in structure. Pulmonic valve regurgitation is not visualized. No evidence of pulmonic stenosis. Aorta: The aortic root is normal in size and  structure. Venous: The inferior vena cava is normal in size with greater than 50% respiratory variability, suggesting right atrial pressure of 3 mmHg. IAS/Shunts: No atrial level shunt detected by color flow Doppler.  LEFT VENTRICLE PLAX 2D LVIDd:         4.80 cm  Diastology LVIDs:         2.40 cm  LV e' lateral:   7.40 cm/s LV PW:         1.00 cm  LV E/e' lateral: 11.5 LV IVS:        1.00 cm  LV e' medial:    6.64 cm/s LVOT diam:     1.90 cm  LV E/e' medial:  12.8 LV SV:         87 LV SV Index:   47 LVOT Area:     2.84 cm  RIGHT VENTRICLE RV Basal diam:  3.20 cm RV S prime:     17.95 cm/s TAPSE (M-mode): 2.9 cm LEFT ATRIUM             Index       RIGHT ATRIUM           Index LA diam:        4.10 cm 2.21 cm/m  RA Area:     12.60 cm LA Vol (A2C):   46.3 ml 24.93 ml/m RA Volume:   30.60 ml  16.48 ml/m LA Vol (A4C):   52.2 ml 28.11 ml/m LA Biplane Vol: 49.5 ml 26.66 ml/m  AORTIC VALVE AV Mean Grad: 6.0 mmHg LVOT Vmax:    123.00 cm/s LVOT Vmean:   92.750 cm/s LVOT VTI:     0.306 m  AORTA Ao Root diam: 3.30 cm Ao Asc diam:  3.10 cm MITRAL VALVE MV Area (PHT): 2.73 cm     SHUNTS MV Decel Time: 278 msec     Systemic VTI:  0.31 m MV  E velocity: 84.90 cm/s   Systemic Diam: 1.90 cm MV A velocity: 114.00 cm/s MV E/A ratio:  0.74 Cherlynn Kaiser MD Electronically signed by Cherlynn Kaiser MD Signature Date/Time: 11/11/2019/10:25:25 AM    Final     ASSESSMENT: This is a very pleasant 69 year old female referred to the clinic for evaluation of thrombocytopenia.   PLAN:  The patient was seen with Dr. Julien Nordmann today.  The patient had a repeat CBC which was unremarkable except for a platelet count of 93k.  Her CMP was unremarkable.   Dr. Julien Nordmann believes this could be related to her prior liver disease or possibly a medication side effect from the Crestor, otherwise, this could represent ITP.   Dr. Julien Nordmann recommends discussing if Crestor can be discontinued for 1 month with her PCP to see if there is improvement in her platelet count.   We will see her back for a follow up visit in 1 month for evaluation and repeat CBC. May consider further studies if her platelets continue to be low at that time. If this is related to ITP, then we would recommend that the patient continue on observation. Would only consider treatment with steroids if platelets <30,000 or active bleeding.   The patient voices understanding of current disease status and treatment options and is in agreement with the current care plan.  All questions were answered. The patient knows to call the clinic with any problems, questions or concerns. We can certainly see the patient much sooner if necessary.  Thank you so much for allowing me to participate in the care of  Derrill Kay. I will continue to follow up the patient with you and assist in her care.  The total time spent in the appointment was 60 minutes.  Disclaimer: This note was dictated with voice recognition software. Similar sounding words can inadvertently be transcribed and may not be corrected upon review.   Maurisha Mongeau L Yemariam Magar November 17, 2019, 4:50 PM  ADDENDUM: Hematology/Oncology  Attending: I had a face-to-face encounter with the patient today.  I recommended her care plan.  This is a pleasant 69 years old white female with medical problems concerning for endometriosis, fatty liver as well as dyslipidemia.  The patient is currently on rosuvastatin for the dyslipidemia.  She was seen recently by her primary care physician and noted on blood work to have thrombocytopenia with platelet count of 107,000.  Repeat CBC a few weeks later showed persistent thrombocytopenia with total platelets count of 114,000. The patient was referred to [place] today for evaluation and recommendation regarding her thrombocytopenia.  She had several studies performed in the past including hepatitis panel as well as HIV that were unremarkable. Repeat CBC today showed persistent thrombocytopenia with platelets count of 93,000. The patient is currently asymptomatic with no bleeding, bruises or ecchymosis.  She has no gum bleed or any other mucous membrane bleeding. I had a lengthy discussion with the patient today about her condition.  I explained to the patient that this could be drug-induced from the rosuvastatin or secondary to the fatty liver versus ITP I recommended for the patient to continue on observation for now since she is asymptomatic but she was also advised to hold her treatment with rosuvastatin for the next few weeks.. We will see her back for follow-up visit in 1 months with repeat CBC.  If she continues to have further decline in her platelets count, we may consider the patient for additional studies including von Willebrand as well as consideration of her bone marrow biopsy and aspirate. The patient was advised to call immediately if she has any concerning symptoms in the interval.  Disclaimer: This note was dictated with voice recognition software. Similar sounding words can inadvertently be transcribed and may be missed upon review. Eilleen Kempf, MD 11/17/19

## 2019-11-18 ENCOUNTER — Telehealth: Payer: Self-pay | Admitting: Medical Oncology

## 2019-11-18 NOTE — Telephone Encounter (Signed)
Last office note faxed to PCP

## 2019-12-08 ENCOUNTER — Ambulatory Visit: Payer: Medicare PPO | Admitting: Cardiology

## 2019-12-14 ENCOUNTER — Ambulatory Visit: Payer: Medicare PPO | Admitting: Cardiovascular Disease

## 2019-12-19 ENCOUNTER — Other Ambulatory Visit: Payer: Self-pay

## 2019-12-19 ENCOUNTER — Encounter: Payer: Self-pay | Admitting: Internal Medicine

## 2019-12-19 ENCOUNTER — Inpatient Hospital Stay: Payer: Medicare PPO | Attending: Physician Assistant | Admitting: Internal Medicine

## 2019-12-19 ENCOUNTER — Inpatient Hospital Stay: Payer: Medicare PPO

## 2019-12-19 ENCOUNTER — Telehealth: Payer: Self-pay | Admitting: Internal Medicine

## 2019-12-19 VITALS — BP 159/64 | HR 69 | Temp 99.1°F | Resp 17 | Wt 175.1 lb

## 2019-12-19 DIAGNOSIS — E1165 Type 2 diabetes mellitus with hyperglycemia: Secondary | ICD-10-CM | POA: Diagnosis not present

## 2019-12-19 DIAGNOSIS — Z794 Long term (current) use of insulin: Secondary | ICD-10-CM | POA: Diagnosis not present

## 2019-12-19 DIAGNOSIS — D696 Thrombocytopenia, unspecified: Secondary | ICD-10-CM | POA: Diagnosis not present

## 2019-12-19 DIAGNOSIS — Z79899 Other long term (current) drug therapy: Secondary | ICD-10-CM | POA: Insufficient documentation

## 2019-12-19 DIAGNOSIS — I10 Essential (primary) hypertension: Secondary | ICD-10-CM | POA: Insufficient documentation

## 2019-12-19 LAB — CBC WITH DIFFERENTIAL (CANCER CENTER ONLY)
Abs Immature Granulocytes: 0.02 10*3/uL (ref 0.00–0.07)
Basophils Absolute: 0 10*3/uL (ref 0.0–0.1)
Basophils Relative: 1 %
Eosinophils Absolute: 0.2 10*3/uL (ref 0.0–0.5)
Eosinophils Relative: 4 %
HCT: 38.5 % (ref 36.0–46.0)
Hemoglobin: 12.8 g/dL (ref 12.0–15.0)
Immature Granulocytes: 0 %
Lymphocytes Relative: 29 %
Lymphs Abs: 1.3 10*3/uL (ref 0.7–4.0)
MCH: 30.6 pg (ref 26.0–34.0)
MCHC: 33.2 g/dL (ref 30.0–36.0)
MCV: 92.1 fL (ref 80.0–100.0)
Monocytes Absolute: 0.5 10*3/uL (ref 0.1–1.0)
Monocytes Relative: 12 %
Neutro Abs: 2.4 10*3/uL (ref 1.7–7.7)
Neutrophils Relative %: 54 %
Platelet Count: 117 10*3/uL — ABNORMAL LOW (ref 150–400)
RBC: 4.18 MIL/uL (ref 3.87–5.11)
RDW: 13.5 % (ref 11.5–15.5)
WBC Count: 4.5 10*3/uL (ref 4.0–10.5)
nRBC: 0 % (ref 0.0–0.2)

## 2019-12-19 NOTE — Telephone Encounter (Signed)
Scheduled appts per 5/10 los- Pt is aware of appts- Print avs and calender

## 2019-12-19 NOTE — Progress Notes (Signed)
[facility] Telephone:([phone]   Fax:(336) [phone]  OFFICE PROGRESS NOTE  [name], MD [address] [place] [address]  DIAGNOSIS: Thrombocytopenia, drug-induced versus ITP.  PRIOR THERAPY: None  CURRENT THERAPY: Observation.  INTERVAL HISTORY: Denise Klein 69 y.o. female returns to the clinic today for follow-up visit.  The patient is feeling fine today with no concerning complaints except for being under a lot of stress dealing with her granddaughter who was diagnosed with brain tumor recently.  The patient also has some issues with control of her diabetes mellitus and hypertension.  She is keeping a log of her blood pressure as well as blood glucose values to share with her primary care physician.  She denied having any bleeding, bruises or ecchymosis.  She has been holding her treatment with Crestor for the last months.  She denied having any nausea, vomiting, diarrhea or constipation.  She is here today for evaluation and repeat CBC.  MEDICAL HISTORY: Past Medical History:  Diagnosis Date  . Broken ankle 1982   right  . Broken wrist 1999   left (external fixator rod)  . Carpal tunnel syndrome, right 2000  . Endometriosis 1978    ALLERGIES:  is allergic to acetaminophen; ibuprofen; naproxen sodium; pentazocine; penicillin g; and sulfamethoxazole.  MEDICATIONS:  Current Outpatient Medications  Medication Sig Dispense Refill  . colesevelam (WELCHOL) 625 MG tablet Take 3 tablets by mouth 2 (two) times daily.    . insulin degludec (TRESIBA FLEXTOUCH) 100 UNIT/ML FlexTouch Pen Inject 167 Units into the skin daily.    . metFORMIN (GLUCOPHAGE-XR) 500 MG 24 hr tablet Take 2 tablets by mouth daily.    . metoprolol succinate (TOPROL-XL) 100 MG 24 hr tablet Take 1 tablet by mouth daily.    . rosuvastatin (CRESTOR) 20 MG tablet Take 1 tablet by mouth daily.    Marland Kitchen telmisartan-hydrochlorothiazide (MICARDIS HCT) 80-12.5 MG tablet Take  2 tablets by mouth daily.    Marland Kitchen triamcinolone ointment (KENALOG) 0.5 % Apply 1 application topically 2 (two) times daily.     No current facility-administered medications for this visit.    SURGICAL HISTORY:  Past Surgical History:  Procedure Laterality Date  . ABDOMINAL HYSTERECTOMY  1999  . TUBAL LIGATION  1986    REVIEW OF SYSTEMS:  A comprehensive review of systems was negative except for: Constitutional: positive for fatigue   PHYSICAL EXAMINATION: General appearance: alert, cooperative and no distress Head: Normocephalic, without obvious abnormality, atraumatic Neck: no adenopathy, no JVD, supple, symmetrical, trachea midline and thyroid not enlarged, symmetric, no tenderness/mass/nodules Lymph nodes: Cervical, supraclavicular, and axillary nodes normal. Resp: clear to auscultation bilaterally Back: symmetric, no curvature. ROM normal. No CVA tenderness. Cardio: regular rate and rhythm, S1, S2 normal, no murmur, click, rub or gallop GI: soft, non-tender; bowel sounds normal; no masses,  no organomegaly Extremities: extremities normal, atraumatic, no cyanosis or edema  ECOG PERFORMANCE STATUS: 1 - Symptomatic but completely ambulatory  Blood pressure (!) 159/64, pulse 69, temperature 99.1 F (37.3 C), temperature source Temporal, resp. rate 17, weight 175 lb 1.6 oz (79.4 kg), SpO2 98 %.  LABORATORY DATA: Lab Results  Component Value Date   WBC 4.5 12/19/2019   HGB 12.8 12/19/2019   HCT 38.5 12/19/2019   MCV 92.1 12/19/2019   PLT 117 (L) 12/19/2019      Chemistry      Component Value Date/Time   NA 143 11/17/2019 1216   K 3.7 11/17/2019  1216   CL 109 11/17/2019 1216   CO2 26 11/17/2019 1216   BUN 10 11/17/2019 1216   CREATININE 0.67 11/17/2019 1216      Component Value Date/Time   CALCIUM 9.0 11/17/2019 1216   ALKPHOS 84 11/17/2019 1216   AST 35 11/17/2019 1216   ALT 35 11/17/2019 1216   BILITOT 1.3 (H) 11/17/2019 1216       RADIOGRAPHIC STUDIES: No  results found.  ASSESSMENT AND PLAN: This is a very pleasant 69 years old white female with persistent thrombocytopenia likely drug-induced versus ITP. Repeat CBC today showed further improvement of her platelets count after the patient discontinued Crestor for the last months. I recommended for the patient to continue on observation with repeat CBC in 6 months. Regarding her cholesterol medication, I recommended for the patient to resume it if it absolutely necessary taking into consideration that it may cause further decrease in her platelets count but that will be okay unless the patient becomes symptomatic with bleeding, bruises or ecchymosis or significant decrease in her platelets count. For the uncontrolled hypertension and diabetes mellitus, she will discuss with her primary care physician adjustment of her medication. She was also advised if she has any concerning symptoms in the interval. The patient voices understanding of current disease status and treatment options and is in agreement with the current care plan.  All questions were answered. The patient knows to call the clinic with any problems, questions or concerns. We can certainly see the patient much sooner if necessary.  Disclaimer: This note was dictated with voice recognition software. Similar sounding words can inadvertently be transcribed and may not be corrected upon review.

## 2019-12-28 ENCOUNTER — Encounter: Payer: Self-pay | Admitting: Cardiovascular Disease

## 2019-12-28 ENCOUNTER — Other Ambulatory Visit: Payer: Self-pay

## 2019-12-28 ENCOUNTER — Ambulatory Visit: Payer: Medicare PPO | Admitting: Cardiovascular Disease

## 2019-12-28 ENCOUNTER — Encounter: Payer: Self-pay | Admitting: *Deleted

## 2019-12-28 VITALS — BP 142/66 | HR 78 | Ht 64.0 in | Wt 178.4 lb

## 2019-12-28 DIAGNOSIS — R079 Chest pain, unspecified: Secondary | ICD-10-CM | POA: Diagnosis not present

## 2019-12-28 DIAGNOSIS — I359 Nonrheumatic aortic valve disorder, unspecified: Secondary | ICD-10-CM | POA: Diagnosis not present

## 2019-12-28 NOTE — Progress Notes (Addendum)
Chief Complaint  Patient presents with  . New Patient (Initial Visit)    cardiac murmur    History of Present Illness: 69 yo female with history of thrombocytopenia, DM and HTN who is here today as a new patient for the evaluation of a cardiac murmur. Echo April 2021 with LVEF=60-65%, aortic valve sclerosis without stenosis. Trivial AI and MR. She tells me that she has had dyspnea with exertion and mild chest pressure with exertion. No LE edema, near syncope or syncope. No known CAD. Sister with CABG recently.   Primary Care Physician: [name], MD   Past Medical History:  Diagnosis Date  . Broken ankle 1982   right  . Broken wrist 1999   left (external fixator rod)  . Carpal tunnel syndrome, right 2000  . Degenerative disc disease at L5-S1 level   . Diabetes ([place])   . Endometriosis 1978  . HTN (hypertension)   . Thrombocytopenia ([place])     Past Surgical History:  Procedure Laterality Date  . ABDOMINAL HYSTERECTOMY  1999  . CARPAL TUNNEL RELEASE Right   . TUBAL LIGATION  1986  . WRIST SURGERY      Current Outpatient Medications  Medication Sig Dispense Refill  . colesevelam (WELCHOL) 625 MG tablet Take 3 tablets by mouth 2 (two) times daily.    . insulin degludec (TRESIBA FLEXTOUCH) 100 UNIT/ML FlexTouch Pen Inject 167 Units into the skin daily.    . metFORMIN (GLUCOPHAGE-XR) 500 MG 24 hr tablet Take 2 tablets by mouth daily.    . metoprolol succinate (TOPROL-XL) 100 MG 24 hr tablet Take 1 tablet by mouth in the morning and at bedtime.     Marland Kitchen telmisartan-hydrochlorothiazide (MICARDIS HCT) 80-12.5 MG tablet Take 2 tablets by mouth daily.    Marland Kitchen triamcinolone ointment (KENALOG) 0.5 % Apply 1 application topically 2 (two) times daily.     No current facility-administered medications for this visit.    Allergies  Allergen Reactions  . Acetaminophen Other (See Comments)    Liver affected  . Ibuprofen Other (See Comments)    Liver affected  . Naproxen Sodium Other  (See Comments)    Liver affected  . Pentazocine Other (See Comments)    hallucinations  . Penicillin G Rash  . Sulfamethoxazole Rash    Social History   Socioeconomic History  . Marital status: Married    Spouse name: Not on file  . Number of children: Not on file  . Years of education: Not on file  . Highest education level: Not on file  Occupational History  . Not on file  Tobacco Use  . Smoking status: Never Smoker  . Smokeless tobacco: Never Used  Substance and Sexual Activity  . Alcohol use: Not on file  . Drug use: Not on file  . Sexual activity: Not on file  Other Topics Concern  . Not on file  Social History Narrative  . Not on file   Social Determinants of Health   Financial Resource Strain:   . Difficulty of Paying Living Expenses:   Food Insecurity:   . Worried About Charity fundraiser in the Last Year:   . Arboriculturist in the Last Year:   Transportation Needs:   . Film/video editor (Medical):   Marland Kitchen Lack of Transportation (Non-Medical):   Physical Activity:   . Days of Exercise per Week:   . Minutes of Exercise per Session:   Stress:   . Feeling of Stress :  Social Connections:   . Frequency of Communication with Friends and Family:   . Frequency of Social Gatherings with Friends and Family:   . Attends Religious Services:   . Active Member of Clubs or Organizations:   . Attends Archivist Meetings:   Marland Kitchen Marital Status:   Intimate Partner Violence:   . Fear of Current or Ex-Partner:   . Emotionally Abused:   Marland Kitchen Physically Abused:   . Sexually Abused:     Family History  Problem Relation Age of Onset  . Heart disease Father   . Heart disease Maternal Grandfather   . Diabetes Mother   . CAD Sister     Review of Systems:  As stated in the HPI and otherwise negative.   BP (!) 142/66   Pulse 78   Ht 5\' 4"  (1.626 m)   Wt 178 lb 6.4 oz (80.9 kg)   SpO2 99%   BMI 30.62 kg/m   Physical Examination: General: Well  developed, well nourished, NAD  HEENT: OP clear, mucus membranes moist  SKIN: warm, dry. No rashes. Neuro: No focal deficits  Musculoskeletal: Muscle strength 5/5 all ext  Psychiatric: Mood and affect normal  Neck: No JVD, no carotid bruits, no thyromegaly, no lymphadenopathy.  Lungs:Clear bilaterally, no wheezes, rhonci, crackles Cardiovascular: Regular rate and rhythm. Soft systolic murmur.  Abdomen:Soft. Bowel sounds present. Non-tender.  Extremities: No lower extremity edema. Pulses are 2 + in the bilateral DP/PT.  EKG:  EKG is ordered today. The ekg ordered today demonstrates Sinus rhythm.   Echo April 2021: 1. Left ventricular ejection fraction, by estimation, is 60 to 65%. The  left ventricle has normal function. The left ventricle has no regional  wall motion abnormalities. There is mild left ventricular hypertrophy.  Left ventricular diastolic parameters  are consistent with Grade I diastolic dysfunction (impaired relaxation).  2. Right ventricular systolic function is normal. The right ventricular  size is normal. Tricuspid regurgitation signal is inadequate for assessing  PA pressure.  3. The mitral valve is normal in structure. Trivial mitral valve  regurgitation. No evidence of mitral stenosis.  4. The aortic valve is abnormal. Aortic valve regurgitation is trivial.  Mild to moderate aortic valve sclerosis/calcification is present, without  any evidence of aortic stenosis.  5. The inferior vena cava is normal in size with greater than 50%  respiratory variability, suggesting right atrial pressure of 3 mmHg.   Recent Labs: 11/17/2019: ALT 35; BUN 10; Creatinine 0.67; Potassium 3.7; Sodium 143 12/19/2019: Hemoglobin 12.8; Platelet Count 117   Lipid Panel No results found for: CHOL, TRIG, HDL, CHOLHDL, VLDL, LDLCALC, LDLDIRECT   Wt Readings from Last 3 Encounters:  12/28/19 178 lb 6.4 oz (80.9 kg)  12/19/19 175 lb 1.6 oz (79.4 kg)  11/17/19 178 lb 9.6 oz (81 kg)       Assessment and Plan:   1. Valvular heart disease: Murmur on exam. Echo April 2021 with sclerosis of the aortic valve but no stenosis. She has trivial AI and trivial MR. Will repeat echo in 3 years.   2. Chest pain: She has exertional dyspnea and chest pressure. Will plan an exercise nuclear stress test.   Current medicines are reviewed at length with the patient today.  The patient does not have concerns regarding medicines.  The following changes have been made:  no change  Labs/ tests ordered today include:   Orders Placed This Encounter  Procedures  . MYOCARDIAL PERFUSION IMAGING  . EKG 12-Lead  Disposition:   FU with me in one year.    Signed, Lauree Chandler, MD 12/28/2019 9:33 AM    [facility] [address], [place], [place]  [address] Phone: [phone]; Fax: [phone]

## 2019-12-28 NOTE — Patient Instructions (Signed)
Medication Instructions:  No changes *If you need a refill on your cardiac medications before your next appointment, please call your pharmacy*   Lab Work: none If you have labs (blood work) drawn today and your tests are completely normal, you will receive your results only by: Marland Kitchen MyChart Message (if you have MyChart) OR . A paper copy in the mail If you have any lab test that is abnormal or we need to change your treatment, we will call you to review the results.   Testing/Procedures: Your physician has requested that you have en exercise stress myoview. For further information please visit [email or web address]. Please follow instruction sheet, as given.    Follow-Up: At [facility], you and your health needs are our priority.  As part of our continuing mission to provide you with exceptional heart care, we have created designated Provider Care Teams.  These Care Teams include your primary Cardiologist (physician) and Advanced Practice Providers (APPs -  Physician Assistants and Nurse Practitioners) who all work together to provide you with the care you need, when you need it.  We recommend signing up for the patient portal called "MyChart".  Sign up information is provided on this After Visit Summary.  MyChart is used to connect with patients for Virtual Visits (Telemedicine).  Patients are able to view lab/test results, encounter notes, upcoming appointments, etc.  Non-urgent messages can be sent to your provider as well.   To learn more about what you can do with MyChart, go to [email or web address].    Your next appointment:   12 month(s)  The format for your next appointment:   Either In Person or Virtual  Provider:   You may see Lauree Chandler, MD or one of the following Advanced Practice Providers on your designated Care Team:    Melina Copa, PA-C  Ermalinda Barrios, PA-C    Other Instructions

## 2020-01-10 ENCOUNTER — Telehealth (HOSPITAL_COMMUNITY): Payer: Self-pay | Admitting: *Deleted

## 2020-01-10 ENCOUNTER — Encounter (HOSPITAL_COMMUNITY): Payer: Self-pay | Admitting: *Deleted

## 2020-01-10 ENCOUNTER — Other Ambulatory Visit (HOSPITAL_COMMUNITY)
Admission: RE | Admit: 2020-01-10 | Discharge: 2020-01-10 | Disposition: A | Discharge status: Home / Self Care | Payer: Medicare PPO | Source: Ambulatory Visit | Attending: Cardiovascular Disease | Admitting: Cardiovascular Disease

## 2020-01-10 DIAGNOSIS — Z20822 Contact with and (suspected) exposure to covid-19: Secondary | ICD-10-CM | POA: Insufficient documentation

## 2020-01-10 DIAGNOSIS — Z01812 Encounter for preprocedural laboratory examination: Secondary | ICD-10-CM | POA: Insufficient documentation

## 2020-01-10 LAB — SARS CORONAVIRUS 2 (TAT 6-24 HRS): SARS Coronavirus 2: NEGATIVE

## 2020-01-10 NOTE — Telephone Encounter (Signed)
Left message on voicemail in reference to upcoming appointment scheduled for 01/13/2020. Phone number given for a call back so details instructions can be given. Amrutha Avera, Ranae Palms No mychart available

## 2020-01-11 ENCOUNTER — Telehealth (HOSPITAL_COMMUNITY): Payer: Self-pay | Admitting: *Deleted

## 2020-01-11 NOTE — Telephone Encounter (Signed)
Patient given detailed instructions per Myocardial Perfusion Study Information Sheet for the test on 01/13/20. Patient notified to arrive 15 minutes early and that it is imperative to arrive on time for appointment to keep from having the test rescheduled.  If you need to cancel or reschedule your appointment, please call the office within 24 hours of your appointment. . Patient verbalized understanding. Kirstie Peri

## 2020-01-13 ENCOUNTER — Telehealth: Payer: Self-pay | Admitting: *Deleted

## 2020-01-13 ENCOUNTER — Other Ambulatory Visit: Payer: Self-pay

## 2020-01-13 ENCOUNTER — Ambulatory Visit (HOSPITAL_COMMUNITY): Payer: Medicare PPO | Attending: Cardiology

## 2020-01-13 DIAGNOSIS — I359 Nonrheumatic aortic valve disorder, unspecified: Secondary | ICD-10-CM | POA: Diagnosis not present

## 2020-01-13 DIAGNOSIS — R079 Chest pain, unspecified: Secondary | ICD-10-CM

## 2020-01-13 LAB — MYOCARDIAL PERFUSION IMAGING
LV dias vol: 76 mL (ref 46–106)
LV sys vol: 21 mL
SDS: 1
SRS: 0
SSS: 1
TID: 1.04

## 2020-01-13 MED ORDER — TECHNETIUM TC 99M TETROFOSMIN IV KIT
10.5000 | PACK | Freq: Once | INTRAVENOUS | Status: AC | PRN
  Administered 2020-01-13: 10.5 via INTRAVENOUS
  Filled 2020-01-13: qty 11

## 2020-01-13 MED ORDER — REGADENOSON 0.4 MG/5ML IV SOLN
0.4000 mg | Freq: Once | INTRAVENOUS | Status: AC
  Administered 2020-01-13: 0.4 mg via INTRAVENOUS

## 2020-01-13 MED ORDER — TECHNETIUM TC 99M TETROFOSMIN IV KIT
30.5000 | PACK | Freq: Once | INTRAVENOUS | Status: AC | PRN
  Administered 2020-01-13: 30.5 via INTRAVENOUS
  Filled 2020-01-13: qty 31

## 2020-01-13 NOTE — Telephone Encounter (Signed)
Pt notified.  She will follow up with her PCP and then let us know if no other cause for shortness of breath found.

## 2020-01-13 NOTE — Telephone Encounter (Signed)
I spoke with patient and reviewed stress test results with her. She reports she continues to have shortness of breath and is asking what could be causing this I told patient I would send message to Dr Angelena Form to see if he had any recommendations regarding shortness of breath.

## 2020-01-13 NOTE — Telephone Encounter (Signed)
-----   Message from Burnell Blanks, MD sent at 01/13/2020  3:17 PM EDT ----- No ischemia on stress test.Can we let her know? Thanks, chris

## 2020-01-13 NOTE — Telephone Encounter (Signed)
Denise Klein, Can we let her know that her stress test suggests that there are no severely blocked arteries. Her echo showed that her heart was strong. She has no significant valve disease. She may consider having her primary care explore other reasons for SOB. If nothing can be found, we could do a gated cardiac CTA to fully exclude CAD. Gerald Stabs

## 2020-03-28 DIAGNOSIS — Z20828 Contact with and (suspected) exposure to other viral communicable diseases: Secondary | ICD-10-CM | POA: Diagnosis not present

## 2020-03-28 DIAGNOSIS — J0100 Acute maxillary sinusitis, unspecified: Secondary | ICD-10-CM | POA: Diagnosis not present

## 2020-04-02 DIAGNOSIS — H6983 Other specified disorders of Eustachian tube, bilateral: Secondary | ICD-10-CM | POA: Diagnosis not present

## 2020-04-02 DIAGNOSIS — Z9629 Presence of other otological and audiological implants: Secondary | ICD-10-CM | POA: Diagnosis not present

## 2020-04-02 DIAGNOSIS — H906 Mixed conductive and sensorineural hearing loss, bilateral: Secondary | ICD-10-CM | POA: Diagnosis not present

## 2020-04-19 DIAGNOSIS — I350 Nonrheumatic aortic (valve) stenosis: Secondary | ICD-10-CM | POA: Diagnosis not present

## 2020-04-19 DIAGNOSIS — D696 Thrombocytopenia, unspecified: Secondary | ICD-10-CM | POA: Diagnosis not present

## 2020-04-19 DIAGNOSIS — D699 Hemorrhagic condition, unspecified: Secondary | ICD-10-CM | POA: Diagnosis not present

## 2020-04-19 DIAGNOSIS — I519 Heart disease, unspecified: Secondary | ICD-10-CM | POA: Diagnosis not present

## 2020-04-19 DIAGNOSIS — E782 Mixed hyperlipidemia: Secondary | ICD-10-CM | POA: Diagnosis not present

## 2020-04-19 DIAGNOSIS — R809 Proteinuria, unspecified: Secondary | ICD-10-CM | POA: Diagnosis not present

## 2020-04-19 DIAGNOSIS — I129 Hypertensive chronic kidney disease with stage 1 through stage 4 chronic kidney disease, or unspecified chronic kidney disease: Secondary | ICD-10-CM | POA: Diagnosis not present

## 2020-04-19 DIAGNOSIS — Z0000 Encounter for general adult medical examination without abnormal findings: Secondary | ICD-10-CM | POA: Diagnosis not present

## 2020-04-19 DIAGNOSIS — N181 Chronic kidney disease, stage 1: Secondary | ICD-10-CM | POA: Diagnosis not present

## 2020-04-19 DIAGNOSIS — E1121 Type 2 diabetes mellitus with diabetic nephropathy: Secondary | ICD-10-CM | POA: Diagnosis not present

## 2020-04-27 DIAGNOSIS — H6983 Other specified disorders of Eustachian tube, bilateral: Secondary | ICD-10-CM | POA: Diagnosis not present

## 2020-04-27 DIAGNOSIS — Z974 Presence of external hearing-aid: Secondary | ICD-10-CM | POA: Diagnosis not present

## 2020-04-27 DIAGNOSIS — H6531 Chronic mucoid otitis media, right ear: Secondary | ICD-10-CM | POA: Diagnosis not present

## 2020-04-27 DIAGNOSIS — H906 Mixed conductive and sensorineural hearing loss, bilateral: Secondary | ICD-10-CM | POA: Diagnosis not present

## 2020-04-27 DIAGNOSIS — H7202 Central perforation of tympanic membrane, left ear: Secondary | ICD-10-CM | POA: Diagnosis not present

## 2020-04-27 DIAGNOSIS — H7292 Unspecified perforation of tympanic membrane, left ear: Secondary | ICD-10-CM | POA: Diagnosis not present

## 2020-05-04 DIAGNOSIS — H6531 Chronic mucoid otitis media, right ear: Secondary | ICD-10-CM | POA: Diagnosis not present

## 2020-05-04 DIAGNOSIS — H905 Unspecified sensorineural hearing loss: Secondary | ICD-10-CM | POA: Diagnosis not present

## 2020-05-04 DIAGNOSIS — H903 Sensorineural hearing loss, bilateral: Secondary | ICD-10-CM | POA: Diagnosis not present

## 2020-05-04 DIAGNOSIS — H906 Mixed conductive and sensorineural hearing loss, bilateral: Secondary | ICD-10-CM | POA: Diagnosis not present

## 2020-05-04 DIAGNOSIS — H6983 Other specified disorders of Eustachian tube, bilateral: Secondary | ICD-10-CM | POA: Diagnosis not present

## 2020-05-04 DIAGNOSIS — H7292 Unspecified perforation of tympanic membrane, left ear: Secondary | ICD-10-CM | POA: Diagnosis not present

## 2020-05-08 DIAGNOSIS — Z20828 Contact with and (suspected) exposure to other viral communicable diseases: Secondary | ICD-10-CM | POA: Diagnosis not present

## 2020-05-08 DIAGNOSIS — J3489 Other specified disorders of nose and nasal sinuses: Secondary | ICD-10-CM | POA: Diagnosis not present

## 2020-05-29 DIAGNOSIS — R21 Rash and other nonspecific skin eruption: Secondary | ICD-10-CM | POA: Diagnosis not present

## 2020-05-29 DIAGNOSIS — J3089 Other allergic rhinitis: Secondary | ICD-10-CM | POA: Diagnosis not present

## 2020-05-29 DIAGNOSIS — H9203 Otalgia, bilateral: Secondary | ICD-10-CM | POA: Diagnosis not present

## 2020-06-20 ENCOUNTER — Encounter: Payer: Self-pay | Admitting: Internal Medicine

## 2020-06-20 ENCOUNTER — Telehealth: Payer: Self-pay | Admitting: Internal Medicine

## 2020-06-20 ENCOUNTER — Other Ambulatory Visit: Payer: Self-pay

## 2020-06-20 ENCOUNTER — Inpatient Hospital Stay: Payer: Medicare PPO

## 2020-06-20 ENCOUNTER — Inpatient Hospital Stay: Payer: Medicare PPO | Attending: Internal Medicine | Admitting: Internal Medicine

## 2020-06-20 VITALS — BP 153/54 | HR 66 | Temp 98.3°F | Resp 18 | Ht 64.0 in | Wt 175.4 lb

## 2020-06-20 DIAGNOSIS — I10 Essential (primary) hypertension: Secondary | ICD-10-CM | POA: Insufficient documentation

## 2020-06-20 DIAGNOSIS — D696 Thrombocytopenia, unspecified: Secondary | ICD-10-CM

## 2020-06-20 DIAGNOSIS — E119 Type 2 diabetes mellitus without complications: Secondary | ICD-10-CM | POA: Diagnosis not present

## 2020-06-20 DIAGNOSIS — Z794 Long term (current) use of insulin: Secondary | ICD-10-CM | POA: Diagnosis not present

## 2020-06-20 DIAGNOSIS — Z79899 Other long term (current) drug therapy: Secondary | ICD-10-CM | POA: Insufficient documentation

## 2020-06-20 DIAGNOSIS — M5137 Other intervertebral disc degeneration, lumbosacral region: Secondary | ICD-10-CM | POA: Insufficient documentation

## 2020-06-20 LAB — CBC WITH DIFFERENTIAL (CANCER CENTER ONLY)
Abs Immature Granulocytes: 0.01 10*3/uL (ref 0.00–0.07)
Basophils Absolute: 0 10*3/uL (ref 0.0–0.1)
Basophils Relative: 1 %
Eosinophils Absolute: 0.2 10*3/uL (ref 0.0–0.5)
Eosinophils Relative: 5 %
HCT: 35.9 % — ABNORMAL LOW (ref 36.0–46.0)
Hemoglobin: 12.3 g/dL (ref 12.0–15.0)
Immature Granulocytes: 0 %
Lymphocytes Relative: 34 %
Lymphs Abs: 1.4 10*3/uL (ref 0.7–4.0)
MCH: 32.7 pg (ref 26.0–34.0)
MCHC: 34.3 g/dL (ref 30.0–36.0)
MCV: 95.5 fL (ref 80.0–100.0)
Monocytes Absolute: 0.5 10*3/uL (ref 0.1–1.0)
Monocytes Relative: 12 %
Neutro Abs: 2 10*3/uL (ref 1.7–7.7)
Neutrophils Relative %: 48 %
Platelet Count: 97 10*3/uL — ABNORMAL LOW (ref 150–400)
RBC: 3.76 MIL/uL — ABNORMAL LOW (ref 3.87–5.11)
RDW: 14.6 % (ref 11.5–15.5)
WBC Count: 4.2 10*3/uL (ref 4.0–10.5)
nRBC: 0 % (ref 0.0–0.2)

## 2020-06-20 NOTE — Telephone Encounter (Signed)
Scheduled per 11/10 los. Printed avs and calendar for pt.  

## 2020-06-20 NOTE — Progress Notes (Signed)
[place] [phone]   Fax:([phone]  OFFICE PROGRESS NOTE  [name], MD [address] [place] [address]  DIAGNOSIS: Thrombocytopenia, drug-induced versus ITP.  PRIOR THERAPY: None  CURRENT THERAPY: Observation.  INTERVAL HISTORY: Denise Klein 69 y.o. female returns to the clinic today for follow-up visit.  The patient is feeling fine today with no concerning complaints except for some vague and nonspecific complaints.  She denied having any chest pain, shortness of breath, cough or hemoptysis.  She denied having any bleeding, bruises and ecchymosis.  She has no nausea, vomiting, diarrhea or constipation.  She is here today for evaluation and repeat CBC.   MEDICAL HISTORY: Past Medical History:  Diagnosis Date   Broken ankle 1982   right   Broken wrist 1999   left (external fixator rod)   Carpal tunnel syndrome, right 2000   Degenerative disc disease at L5-S1 level    Diabetes ([place])    Endometriosis 1978   HTN (hypertension)    Thrombocytopenia ([facility])     ALLERGIES:  is allergic to acetaminophen, ibuprofen, naproxen sodium, pentazocine, penicillin g, and sulfamethoxazole.  MEDICATIONS:  Current Outpatient Medications  Medication Sig Dispense Refill   colesevelam (WELCHOL) 625 MG tablet Take 3 tablets by mouth 2 (two) times daily.     insulin degludec (TRESIBA FLEXTOUCH) 100 UNIT/ML FlexTouch Pen Inject 167 Units into the skin daily.     metFORMIN (GLUCOPHAGE-XR) 500 MG 24 hr tablet Take 2 tablets by mouth 3 (three) times daily.      metoprolol succinate (TOPROL-XL) 100 MG 24 hr tablet Take 1 tablet by mouth in the morning and at bedtime.      telmisartan-hydrochlorothiazide (MICARDIS HCT) 80-12.5 MG tablet Take 2 tablets by mouth daily.     triamcinolone ointment (KENALOG) 0.5 % Apply 1 application topically 2 (two) times daily.     No current facility-administered medications for this  visit.    SURGICAL HISTORY:  Past Surgical History:  Procedure Laterality Date   ABDOMINAL HYSTERECTOMY  1999   CARPAL TUNNEL RELEASE Right    TUBAL LIGATION  1986   WRIST SURGERY      REVIEW OF SYSTEMS:  A comprehensive review of systems was negative.   PHYSICAL EXAMINATION: General appearance: alert, cooperative and no distress Head: Normocephalic, without obvious abnormality, atraumatic Neck: no adenopathy, no JVD, supple, symmetrical, trachea midline and thyroid not enlarged, symmetric, no tenderness/mass/nodules Lymph nodes: Cervical, supraclavicular, and axillary nodes normal. Resp: clear to auscultation bilaterally Back: symmetric, no curvature. ROM normal. No CVA tenderness. Cardio: regular rate and rhythm, S1, S2 normal, no murmur, click, rub or gallop GI: soft, non-tender; bowel sounds normal; no masses,  no organomegaly Extremities: extremities normal, atraumatic, no cyanosis or edema  ECOG PERFORMANCE STATUS: 1 - Symptomatic but completely ambulatory  Blood pressure (!) 153/54, pulse 66, temperature 98.3 F (36.8 C), temperature source Tympanic, resp. rate 18, height 5\' 4"  (1.626 m), weight 175 lb 6.4 oz (79.6 kg), SpO2 99 %.  LABORATORY DATA: Lab Results  Component Value Date   WBC 4.2 06/20/2020   HGB 12.3 06/20/2020   HCT 35.9 (L) 06/20/2020   MCV 95.5 06/20/2020   PLT 97 (L) 06/20/2020      Chemistry      Component Value Date/Time   NA 143 11/17/2019 1216   K 3.7 11/17/2019 1216   CL 109 11/17/2019 1216   CO2 26 11/17/2019 1216   BUN 10 11/17/2019  1216   CREATININE 0.67 11/17/2019 1216      Component Value Date/Time   CALCIUM 9.0 11/17/2019 1216   ALKPHOS 84 11/17/2019 1216   AST 35 11/17/2019 1216   ALT 35 11/17/2019 1216   BILITOT 1.3 (H) 11/17/2019 1216       RADIOGRAPHIC STUDIES: No results found.  ASSESSMENT AND PLAN: This is a very pleasant 69 years old white female with persistent thrombocytopenia likely drug-induced versus  ITP. Repeat CBC today showed platelets count of 97,000. I recommended for the patient to continue on observation with repeat CBC in 6 months. I recommended for the patient to resume her cholesterol medicine by her primary care physician but probably at a lower dose. She was advised to call immediately if she has any concerning symptoms in the interval. The patient voices understanding of current disease status and treatment options and is in agreement with the current care plan.  All questions were answered. The patient knows to call the clinic with any problems, questions or concerns. We can certainly see the patient much sooner if necessary.  Disclaimer: This note was dictated with voice recognition software. Similar sounding words can inadvertently be transcribed and may not be corrected upon review.

## 2020-06-21 DIAGNOSIS — N644 Mastodynia: Secondary | ICD-10-CM | POA: Diagnosis not present

## 2020-06-28 DIAGNOSIS — E782 Mixed hyperlipidemia: Secondary | ICD-10-CM | POA: Diagnosis not present

## 2020-06-28 DIAGNOSIS — R945 Abnormal results of liver function studies: Secondary | ICD-10-CM | POA: Diagnosis not present

## 2020-06-28 DIAGNOSIS — Z23 Encounter for immunization: Secondary | ICD-10-CM | POA: Diagnosis not present

## 2020-07-30 DIAGNOSIS — R945 Abnormal results of liver function studies: Secondary | ICD-10-CM | POA: Diagnosis not present

## 2020-08-06 ENCOUNTER — Other Ambulatory Visit: Payer: Self-pay | Admitting: Family Medicine

## 2020-08-06 DIAGNOSIS — R7989 Other specified abnormal findings of blood chemistry: Secondary | ICD-10-CM

## 2020-08-17 ENCOUNTER — Ambulatory Visit
Admission: RE | Admit: 2020-08-17 | Discharge: 2020-08-17 | Disposition: A | Discharge status: Home / Self Care | Payer: Medicare PPO | Source: Ambulatory Visit | Attending: Family Medicine | Admitting: Family Medicine

## 2020-08-17 DIAGNOSIS — R945 Abnormal results of liver function studies: Secondary | ICD-10-CM

## 2020-08-17 DIAGNOSIS — K760 Fatty (change of) liver, not elsewhere classified: Secondary | ICD-10-CM | POA: Diagnosis not present

## 2020-08-17 DIAGNOSIS — R7989 Other specified abnormal findings of blood chemistry: Secondary | ICD-10-CM

## 2020-08-25 DIAGNOSIS — H6990 Unspecified Eustachian tube disorder, unspecified ear: Secondary | ICD-10-CM | POA: Diagnosis not present

## 2020-08-25 DIAGNOSIS — R051 Acute cough: Secondary | ICD-10-CM | POA: Diagnosis not present

## 2020-08-25 DIAGNOSIS — Z20828 Contact with and (suspected) exposure to other viral communicable diseases: Secondary | ICD-10-CM | POA: Diagnosis not present

## 2020-10-19 DIAGNOSIS — L299 Pruritus, unspecified: Secondary | ICD-10-CM | POA: Diagnosis not present

## 2020-10-19 DIAGNOSIS — K760 Fatty (change of) liver, not elsewhere classified: Secondary | ICD-10-CM | POA: Diagnosis not present

## 2020-10-19 DIAGNOSIS — I129 Hypertensive chronic kidney disease with stage 1 through stage 4 chronic kidney disease, or unspecified chronic kidney disease: Secondary | ICD-10-CM | POA: Diagnosis not present

## 2020-10-19 DIAGNOSIS — Z888 Allergy status to other drugs, medicaments and biological substances status: Secondary | ICD-10-CM | POA: Diagnosis not present

## 2020-10-19 DIAGNOSIS — E1121 Type 2 diabetes mellitus with diabetic nephropathy: Secondary | ICD-10-CM | POA: Diagnosis not present

## 2020-10-19 DIAGNOSIS — D693 Immune thrombocytopenic purpura: Secondary | ICD-10-CM | POA: Diagnosis not present

## 2020-10-19 DIAGNOSIS — N181 Chronic kidney disease, stage 1: Secondary | ICD-10-CM | POA: Diagnosis not present

## 2020-10-19 DIAGNOSIS — E782 Mixed hyperlipidemia: Secondary | ICD-10-CM | POA: Diagnosis not present

## 2020-10-19 DIAGNOSIS — E559 Vitamin D deficiency, unspecified: Secondary | ICD-10-CM | POA: Diagnosis not present

## 2020-11-01 DIAGNOSIS — H6983 Other specified disorders of Eustachian tube, bilateral: Secondary | ICD-10-CM | POA: Diagnosis not present

## 2020-11-01 DIAGNOSIS — H903 Sensorineural hearing loss, bilateral: Secondary | ICD-10-CM | POA: Diagnosis not present

## 2020-11-01 DIAGNOSIS — H7292 Unspecified perforation of tympanic membrane, left ear: Secondary | ICD-10-CM | POA: Diagnosis not present

## 2020-11-07 DIAGNOSIS — K7400 Hepatic fibrosis, unspecified: Secondary | ICD-10-CM | POA: Diagnosis not present

## 2020-11-07 DIAGNOSIS — R748 Abnormal levels of other serum enzymes: Secondary | ICD-10-CM | POA: Diagnosis not present

## 2020-11-07 DIAGNOSIS — E782 Mixed hyperlipidemia: Secondary | ICD-10-CM | POA: Diagnosis not present

## 2020-11-07 DIAGNOSIS — K760 Fatty (change of) liver, not elsewhere classified: Secondary | ICD-10-CM | POA: Diagnosis not present

## 2020-11-07 DIAGNOSIS — K739 Chronic hepatitis, unspecified: Secondary | ICD-10-CM | POA: Diagnosis not present

## 2020-12-04 ENCOUNTER — Telehealth: Payer: Self-pay | Admitting: Internal Medicine

## 2020-12-04 NOTE — Telephone Encounter (Signed)
R/s 5/10 appt due to provider PAL. Called and spoke with patient. Confirmed new date and time  

## 2020-12-18 ENCOUNTER — Ambulatory Visit: Payer: Medicare PPO | Admitting: Internal Medicine

## 2020-12-18 ENCOUNTER — Other Ambulatory Visit: Payer: Medicare PPO

## 2020-12-27 ENCOUNTER — Encounter: Payer: Self-pay | Admitting: Internal Medicine

## 2020-12-27 ENCOUNTER — Inpatient Hospital Stay: Payer: Medicare PPO | Admitting: Internal Medicine

## 2020-12-27 ENCOUNTER — Other Ambulatory Visit: Payer: Self-pay

## 2020-12-27 ENCOUNTER — Inpatient Hospital Stay: Payer: Medicare PPO | Attending: Internal Medicine

## 2020-12-27 VITALS — BP 144/51 | HR 73 | Temp 98.2°F | Resp 19 | Ht 64.0 in | Wt 175.2 lb

## 2020-12-27 DIAGNOSIS — D539 Nutritional anemia, unspecified: Secondary | ICD-10-CM | POA: Diagnosis not present

## 2020-12-27 DIAGNOSIS — D61818 Other pancytopenia: Secondary | ICD-10-CM | POA: Diagnosis not present

## 2020-12-27 DIAGNOSIS — D696 Thrombocytopenia, unspecified: Secondary | ICD-10-CM

## 2020-12-27 DIAGNOSIS — R1012 Left upper quadrant pain: Secondary | ICD-10-CM | POA: Diagnosis not present

## 2020-12-27 DIAGNOSIS — Z882 Allergy status to sulfonamides status: Secondary | ICD-10-CM | POA: Diagnosis not present

## 2020-12-27 DIAGNOSIS — K760 Fatty (change of) liver, not elsewhere classified: Secondary | ICD-10-CM | POA: Diagnosis not present

## 2020-12-27 DIAGNOSIS — R5383 Other fatigue: Secondary | ICD-10-CM | POA: Insufficient documentation

## 2020-12-27 DIAGNOSIS — Z880 Allergy status to penicillin: Secondary | ICD-10-CM | POA: Insufficient documentation

## 2020-12-27 DIAGNOSIS — Z886 Allergy status to analgesic agent status: Secondary | ICD-10-CM | POA: Diagnosis not present

## 2020-12-27 LAB — CBC WITH DIFFERENTIAL (CANCER CENTER ONLY)
Abs Immature Granulocytes: 0 10*3/uL (ref 0.00–0.07)
Basophils Absolute: 0 10*3/uL (ref 0.0–0.1)
Basophils Relative: 1 %
Eosinophils Absolute: 0.2 10*3/uL (ref 0.0–0.5)
Eosinophils Relative: 5 %
HCT: 34.3 % — ABNORMAL LOW (ref 36.0–46.0)
Hemoglobin: 11.7 g/dL — ABNORMAL LOW (ref 12.0–15.0)
Immature Granulocytes: 0 %
Lymphocytes Relative: 33 %
Lymphs Abs: 1.2 10*3/uL (ref 0.7–4.0)
MCH: 32.1 pg (ref 26.0–34.0)
MCHC: 34.1 g/dL (ref 30.0–36.0)
MCV: 94 fL (ref 80.0–100.0)
Monocytes Absolute: 0.4 10*3/uL (ref 0.1–1.0)
Monocytes Relative: 12 %
Neutro Abs: 1.8 10*3/uL (ref 1.7–7.7)
Neutrophils Relative %: 49 %
Platelet Count: 73 10*3/uL — ABNORMAL LOW (ref 150–400)
RBC: 3.65 MIL/uL — ABNORMAL LOW (ref 3.87–5.11)
RDW: 13.6 % (ref 11.5–15.5)
WBC Count: 3.6 10*3/uL — ABNORMAL LOW (ref 4.0–10.5)
nRBC: 0 % (ref 0.0–0.2)

## 2020-12-27 NOTE — Progress Notes (Signed)
[place] Telephone:([phone]   Fax:(336) [phone]  OFFICE PROGRESS NOTE  [name], MD [address]  [place] [address]  DIAGNOSIS: Pancytopenia initially presented as thrombocytopenia, drug-induced versus ITP.  PRIOR THERAPY: None  CURRENT THERAPY: Observation.  INTERVAL HISTORY: Denise Klein 70 y.o. female returns to the clinic today for follow-up visit.  The patient is feeling fine today with no concerning complaints except for fatigue.  She is currently being evaluated by the hematology clinic for liver steatosis.  She has been on treatment with cholesterol medicine which was discontinued.  She denied having any current chest pain, shortness of breath, cough or hemoptysis.  She denied having any fever or chills.  She continues to have left upper quadrant abdominal pain.  She has no nausea, vomiting, diarrhea or constipation.  She has no headache or visual changes.  She is here today for evaluation and repeat CBC.  MEDICAL HISTORY: Past Medical History:  Diagnosis Date  . Broken ankle 1982   right  . Broken wrist 1999   left (external fixator rod)  . Carpal tunnel syndrome, right 2000  . Degenerative disc disease at L5-S1 level   . Diabetes ([place])   . Endometriosis 1978  . HTN (hypertension)   . Thrombocytopenia ([facility])     ALLERGIES:  is allergic to acetaminophen, ibuprofen, molds & smuts, naproxen sodium, pentazocine, penicillin g, and sulfamethoxazole.  MEDICATIONS:  Current Outpatient Medications  Medication Sig Dispense Refill  . ACCU-CHEK AVIVA PLUS test strip     . cetirizine (ZYRTEC) 10 MG tablet Take 10 mg by mouth daily.    . Cholecalciferol 25 MCG (1000 UT) tablet Take 25 Units by mouth daily.    . colesevelam (WELCHOL) 625 MG tablet Take 3 tablets by mouth 2 (two) times daily.    . insulin degludec (TRESIBA FLEXTOUCH) 100 UNIT/ML FlexTouch Pen Inject 167 Units into the skin daily.    . metFORMIN  (GLUCOPHAGE-XR) 500 MG 24 hr tablet Take 2 tablets by mouth 3 (three) times daily.     . metoprolol succinate (TOPROL-XL) 100 MG 24 hr tablet Take 1 tablet by mouth in the morning and at bedtime.     Marland Kitchen telmisartan-hydrochlorothiazide (MICARDIS HCT) 80-12.5 MG tablet Take 2 tablets by mouth daily.    Marland Kitchen triamcinolone ointment (KENALOG) 0.5 % Apply 1 application topically 2 (two) times daily.     No current facility-administered medications for this visit.    SURGICAL HISTORY:  Past Surgical History:  Procedure Laterality Date  . ABDOMINAL HYSTERECTOMY  1999  . CARPAL TUNNEL RELEASE Right   . TUBAL LIGATION  1986  . WRIST SURGERY      REVIEW OF SYSTEMS:  Constitutional: positive for fatigue Eyes: negative Ears, nose, mouth, throat, and face: negative Respiratory: negative Cardiovascular: negative Gastrointestinal: positive for abdominal pain Genitourinary:negative Integument/breast: negative Hematologic/lymphatic: negative Musculoskeletal:negative Neurological: negative Behavioral/Psych: negative Endocrine: negative Allergic/Immunologic: negative   PHYSICAL EXAMINATION: General appearance: alert, cooperative, fatigued and no distress Head: Normocephalic, without obvious abnormality, atraumatic Neck: no adenopathy, no JVD, supple, symmetrical, trachea midline and thyroid not enlarged, symmetric, no tenderness/mass/nodules Lymph nodes: Cervical, supraclavicular, and axillary nodes normal. Resp: clear to auscultation bilaterally Back: symmetric, no curvature. ROM normal. No CVA tenderness. Cardio: regular rate and rhythm, S1, S2 normal, no murmur, click, rub or gallop GI: soft, non-tender; bowel sounds normal; no masses,  no organomegaly Extremities: extremities normal, atraumatic, no cyanosis or edema Neurologic: Alert and oriented X 3,  normal strength and tone. Normal symmetric reflexes. Normal coordination and gait  ECOG PERFORMANCE STATUS: 1 - Symptomatic but completely  ambulatory  Blood pressure (!) 144/51, pulse 73, temperature 98.2 F (36.8 C), temperature source Tympanic, resp. rate 19, height _0  (1.626 m), weight 175 lb 3.2 oz (79.5 kg), SpO2 99 %.  LABORATORY DATA: Lab Results  Component Value Date   WBC 3.6 (L) 12/27/2020   HGB 11.7 (L) 12/27/2020   HCT 34.3 (L) 12/27/2020   MCV 94.0 12/27/2020   PLT 73 (L) 12/27/2020      Chemistry      Component Value Date/Time   NA 143 11/17/2019 1216   K 3.7 11/17/2019 1216   CL 109 11/17/2019 1216   CO2 26 11/17/2019 1216   BUN 10 11/17/2019 1216   CREATININE 0.67 11/17/2019 1216      Component Value Date/Time   CALCIUM 9.0 11/17/2019 1216   ALKPHOS 84 11/17/2019 1216   AST 35 11/17/2019 1216   ALT 35 11/17/2019 1216   BILITOT 1.3 (H) 11/17/2019 1216       RADIOGRAPHIC STUDIES: No results found.  ASSESSMENT AND PLAN: This is a very pleasant 70 years old white female who was initially evaluated for thrombocytopenia thought at that time to be drug-induced versus ITP. The patient is also currently undergoing evaluation of hepatic steatosis by the hepatology clinic. She had repeat CBC today that showed pancytopenia with worsening thrombocytopenia down to 73,000. I am concerned about underlying bone marrow abnormality. I recommended for the patient to have a bone marrow biopsy and aspirate performed next week. I will see her back for follow-up visit in 1 months for evaluation and discussion of her biopsy results and further recommendation regarding her condition. She is currently asymptomatic from the pancytopenia but we will continue to monitor her closely. She was advised to call immediately if she has any other concerning symptoms in the interval. The patient had a lot of questions and I answered them completely to her satisfaction. The patient voices understanding of current disease status and treatment options and is in agreement with the current care plan.  All questions were  answered. The patient knows to call the clinic with any problems, questions or concerns. We can certainly see the patient much sooner if necessary. The total time spent in the appointment was 35 minutes.  Disclaimer: This note was dictated with voice recognition software. Similar sounding words can inadvertently be transcribed and may not be corrected upon review.

## 2020-12-31 ENCOUNTER — Telehealth: Payer: Self-pay | Admitting: Internal Medicine

## 2020-12-31 NOTE — Telephone Encounter (Signed)
Scheduled per los. Called and left msg. Mailed printout  °

## 2021-01-03 ENCOUNTER — Other Ambulatory Visit: Payer: Self-pay | Admitting: Student

## 2021-01-04 ENCOUNTER — Ambulatory Visit (HOSPITAL_COMMUNITY)
Admission: RE | Admit: 2021-01-04 | Discharge: 2021-01-04 | Disposition: A | Discharge status: Home / Self Care | Payer: Medicare PPO | Source: Ambulatory Visit | Attending: Internal Medicine | Admitting: Internal Medicine

## 2021-01-04 ENCOUNTER — Encounter (HOSPITAL_COMMUNITY): Payer: Self-pay

## 2021-01-04 ENCOUNTER — Other Ambulatory Visit: Payer: Self-pay

## 2021-01-04 DIAGNOSIS — E119 Type 2 diabetes mellitus without complications: Secondary | ICD-10-CM | POA: Insufficient documentation

## 2021-01-04 DIAGNOSIS — I10 Essential (primary) hypertension: Secondary | ICD-10-CM | POA: Diagnosis not present

## 2021-01-04 DIAGNOSIS — D696 Thrombocytopenia, unspecified: Secondary | ICD-10-CM | POA: Insufficient documentation

## 2021-01-04 DIAGNOSIS — D649 Anemia, unspecified: Secondary | ICD-10-CM | POA: Diagnosis not present

## 2021-01-04 DIAGNOSIS — N809 Endometriosis, unspecified: Secondary | ICD-10-CM | POA: Insufficient documentation

## 2021-01-04 DIAGNOSIS — Z794 Long term (current) use of insulin: Secondary | ICD-10-CM | POA: Insufficient documentation

## 2021-01-04 DIAGNOSIS — Z880 Allergy status to penicillin: Secondary | ICD-10-CM | POA: Diagnosis not present

## 2021-01-04 DIAGNOSIS — D539 Nutritional anemia, unspecified: Secondary | ICD-10-CM

## 2021-01-04 DIAGNOSIS — M5137 Other intervertebral disc degeneration, lumbosacral region: Secondary | ICD-10-CM | POA: Diagnosis not present

## 2021-01-04 DIAGNOSIS — Z7984 Long term (current) use of oral hypoglycemic drugs: Secondary | ICD-10-CM | POA: Insufficient documentation

## 2021-01-04 DIAGNOSIS — D61818 Other pancytopenia: Secondary | ICD-10-CM | POA: Diagnosis not present

## 2021-01-04 DIAGNOSIS — Z886 Allergy status to analgesic agent status: Secondary | ICD-10-CM | POA: Insufficient documentation

## 2021-01-04 DIAGNOSIS — Z79899 Other long term (current) drug therapy: Secondary | ICD-10-CM | POA: Diagnosis not present

## 2021-01-04 DIAGNOSIS — Z882 Allergy status to sulfonamides status: Secondary | ICD-10-CM | POA: Diagnosis not present

## 2021-01-04 LAB — CBC WITH DIFFERENTIAL/PLATELET
Abs Immature Granulocytes: 0.01 10*3/uL (ref 0.00–0.07)
Basophils Absolute: 0 10*3/uL (ref 0.0–0.1)
Basophils Relative: 1 %
Eosinophils Absolute: 0.3 10*3/uL (ref 0.0–0.5)
Eosinophils Relative: 6 %
HCT: 38.1 % (ref 36.0–46.0)
Hemoglobin: 13.2 g/dL (ref 12.0–15.0)
Immature Granulocytes: 0 %
Lymphocytes Relative: 33 %
Lymphs Abs: 1.4 10*3/uL (ref 0.7–4.0)
MCH: 32.6 pg (ref 26.0–34.0)
MCHC: 34.6 g/dL (ref 30.0–36.0)
MCV: 94.1 fL (ref 80.0–100.0)
Monocytes Absolute: 0.5 10*3/uL (ref 0.1–1.0)
Monocytes Relative: 11 %
Neutro Abs: 2.1 10*3/uL (ref 1.7–7.7)
Neutrophils Relative %: 49 %
Platelets: 110 10*3/uL — ABNORMAL LOW (ref 150–400)
RBC: 4.05 MIL/uL (ref 3.87–5.11)
RDW: 14 % (ref 11.5–15.5)
WBC: 4.3 10*3/uL (ref 4.0–10.5)
nRBC: 0 % (ref 0.0–0.2)

## 2021-01-04 LAB — GLUCOSE, CAPILLARY: Glucose-Capillary: 127 mg/dL — ABNORMAL HIGH (ref 70–99)

## 2021-01-04 MED ORDER — LIDOCAINE HCL (PF) 1 % IJ SOLN
INTRAMUSCULAR | Status: AC | PRN
Start: 2021-01-04 — End: 2021-01-04
  Administered 2021-01-04: 10 mL

## 2021-01-04 MED ORDER — MIDAZOLAM HCL 2 MG/2ML IJ SOLN
INTRAMUSCULAR | Status: AC | PRN
  Administered 2021-01-04 (×2): 1 mg via INTRAVENOUS

## 2021-01-04 MED ORDER — FENTANYL CITRATE (PF) 100 MCG/2ML IJ SOLN
INTRAMUSCULAR | Status: AC | PRN
  Administered 2021-01-04 (×2): 50 ug via INTRAVENOUS

## 2021-01-04 MED ORDER — SODIUM CHLORIDE 0.9 % IV SOLN: INTRAVENOUS | Status: DC

## 2021-01-04 MED ORDER — FENTANYL CITRATE (PF) 100 MCG/2ML IJ SOLN
INTRAMUSCULAR | Status: AC
  Filled 2021-01-04: qty 2

## 2021-01-04 MED ORDER — MIDAZOLAM HCL 2 MG/2ML IJ SOLN
INTRAMUSCULAR | Status: AC
  Filled 2021-01-04: qty 4

## 2021-01-04 NOTE — Procedures (Signed)
Interventional Radiology Procedure Note  Procedure: CT guided aspirate and core biopsy of right posterior iliac bone Complications: None Recommendations: - Bedrest supine x 1 hrs - OTC's PRN  Pain - Follow biopsy results  Signed,  Pascal Stiggers S. Nira Visscher, DO    

## 2021-01-04 NOTE — Discharge Instructions (Signed)
Please call Interventional Radiology clinic [phone] with any questions or concerns.  You may remove your dressing and shower tomorrow.   Bone Marrow Aspiration and Bone Marrow Biopsy, Adult, Care After This sheet gives you information about how to care for yourself after your procedure. Your health care provider may also give you more specific instructions. If you have problems or questions, contact your health care provider. What can I expect after the procedure? After the procedure, it is common to have:  Mild pain and tenderness.  Swelling.  Bruising. Follow these instructions at home: Puncture site care 1. Follow instructions from your health care provider about how to take care of the puncture site. Make sure you: ? Wash your hands with soap and water before and after you change your bandage (dressing). If soap and water are not available, use hand sanitizer. ? Change your dressing as told by your health care provider. 2. Check your puncture site every day for signs of infection. Check for: ? More redness, swelling, or pain. ? Fluid or blood. ? Warmth. ? Pus or a bad smell.   Activity 1. Return to your normal activities as told by your health care provider. Ask your health care provider what activities are safe for you. 2. Do not lift anything that is heavier than 10 lb (4.5 kg), or the limit that you are told, until your health care provider says that it is safe. 3. Do not drive for 24 hours if you were given a sedative during your procedure. General instructions 1. Take over-the-counter and prescription medicines only as told by your health care provider. 2. Do not take baths, swim, or use a hot tub until your health care provider approves. Ask your health care provider if you may take showers. You may only be allowed to take sponge baths. 3. If directed, put ice on the affected area. To do this: ? Put ice in a plastic bag. ? Place a towel between your skin and the  bag. ? Leave the ice on for 20 minutes, 2-3 times a day. 4. Keep all follow-up visits as told by your health care provider. This is important.   Contact a health care provider if:  Your pain is not controlled with medicine.  You have a fever.  You have more redness, swelling, or pain around the puncture site.  You have fluid or blood coming from the puncture site.  Your puncture site feels warm to the touch.  You have pus or a bad smell coming from the puncture site. Summary  After the procedure, it is common to have mild pain, tenderness, swelling, and bruising.  Follow instructions from your health care provider about how to take care of the puncture site and what activities are safe for you.  Take over-the-counter and prescription medicines only as told by your health care provider.  Contact a health care provider if you have any signs of infection, such as fluid or blood coming from the puncture site. This information is not intended to replace advice given to you by your health care provider. Make sure you discuss any questions you have with your health care provider. Document Revised: 12/14/2018 Document Reviewed: 12/14/2018 Elsevier Patient Education  2021 [place].   Moderate Conscious Sedation, Adult, Care After This sheet gives you information about how to care for yourself after your procedure. Your health care provider may also give you more specific instructions. If you have problems or questions, contact your health care provider. What  can I expect after the procedure? After the procedure, it is common to have:  Sleepiness for several hours.  Impaired judgment for several hours.  Difficulty with balance.  Vomiting if you eat too soon. Follow these instructions at home: For the time period you were told by your health care provider: 3. Rest. 4. Do not participate in activities where you could fall or become injured. 5. Do not drive or use  machinery. 6. Do not drink alcohol. 7. Do not take sleeping pills or medicines that cause drowsiness. 8. Do not make important decisions or sign legal documents. 9. Do not take care of children on your own.      Eating and drinking 4. Follow the diet recommended by your health care provider. 5. Drink enough fluid to keep your urine pale yellow. 6. If you vomit: ? Drink water, juice, or soup when you can drink without vomiting. ? Make sure you have little or no nausea before eating solid foods.   General instructions 5. Take over-the-counter and prescription medicines only as told by your health care provider. 6. Have a responsible adult stay with you for the time you are told. It is important to have someone help care for you until you are awake and alert. 7. Do not smoke. 8. Keep all follow-up visits as told by your health care provider. This is important. Contact a health care provider if:  You are still sleepy or having trouble with balance after 24 hours.  You feel light-headed.  You keep feeling nauseous or you keep vomiting.  You develop a rash.  You have a fever.  You have redness or swelling around the IV site. Get help right away if:  You have trouble breathing.  You have new-onset confusion at home. Summary  After the procedure, it is common to feel sleepy, have impaired judgment, or feel nauseous if you eat too soon.  Rest after you get home. Know the things you should not do after the procedure.  Follow the diet recommended by your health care provider and drink enough fluid to keep your urine pale yellow.  Get help right away if you have trouble breathing or new-onset confusion at home. This information is not intended to replace advice given to you by your health care provider. Make sure you discuss any questions you have with your health care provider. Document Revised: 11/25/2019 Document Reviewed: 06/23/2019 Elsevier Patient Education  2021 Elsevier  Inc.      

## 2021-01-04 NOTE — Consult Note (Signed)
Chief Complaint: Patient was seen in consultation today for CT-guided bone marrow biopsy  Referring Physician(s): Mohamed,Mohamed  Supervising Physician: Corrie Mckusick  Patient Status: [facility] - Out-pt  History of Present Illness: Denise Klein is a 70 y.o. female with history of diabetes, degenerative disc disease, endometriosis, hypertension and now with pancytopenia of uncertain etiology.  She presents today for CT-guided bone marrow biopsy for further evaluation.  Past Medical History:  Diagnosis Date  . Broken ankle 1982   right  . Broken wrist 1999   left (external fixator rod)  . Carpal tunnel syndrome, right 2000  . Degenerative disc disease at L5-S1 level   . Diabetes ([place])   . Endometriosis 1978  . HTN (hypertension)   . Thrombocytopenia ([place])     Past Surgical History:  Procedure Laterality Date  . ABDOMINAL HYSTERECTOMY  1999  . CARPAL TUNNEL RELEASE Right   . TUBAL LIGATION  1986  . WRIST SURGERY      Allergies: Acetaminophen, Ibuprofen, Meloxicam, Molds & smuts, Naproxen sodium, Pentazocine, Penicillin g, and Sulfamethoxazole  Medications: Prior to Admission medications   Medication Sig Start Date End Date Taking? Authorizing Provider  ACCU-CHEK AVIVA PLUS test strip  04/04/20  Yes [provider]  cetirizine (ZYRTEC) 10 MG tablet Take 10 mg by mouth daily. 06/14/20  Yes [provider]  Cholecalciferol (VITAMIN D3) 25 MCG (1000 UT) CAPS Take 2 capsules by mouth in the morning and at bedtime.   Yes [provider]  Cholecalciferol 25 MCG (1000 UT) tablet Take 25 Units by mouth daily.   Yes [provider]  colesevelam (WELCHOL) 625 MG tablet Take 3 tablets by mouth 2 (two) times daily. 11/06/15  Yes [provider]  insulin degludec (TRESIBA FLEXTOUCH) 100 UNIT/ML FlexTouch Pen Inject 167 Units into the skin daily. 05/16/17  Yes [provider]  metFORMIN (GLUCOPHAGE-XR) 500 MG 24 hr tablet Take 2  tablets by mouth in the morning and at bedtime. 11/06/15  Yes [provider]  metoprolol succinate (TOPROL-XL) 100 MG 24 hr tablet Take 1 tablet by mouth in the morning and at bedtime.  12/08/15  Yes [provider]  telmisartan-hydrochlorothiazide (MICARDIS HCT) 80-12.5 MG tablet Take 2 tablets by mouth daily. 05/24/17  Yes [provider]  triamcinolone ointment (KENALOG) 0.5 % Apply 1 application topically 2 (two) times daily.   Yes [provider]     Family History  Problem Relation Age of Onset  . Heart disease Father   . Heart disease Maternal Grandfather   . Diabetes Mother   . CAD Sister     Social History   Socioeconomic History  . Marital status: Married    Spouse name: Not on file  . Number of children: Not on file  . Years of education: Not on file  . Highest education level: Not on file  Occupational History  . Not on file  Tobacco Use  . Smoking status: Never Smoker  . Smokeless tobacco: Never Used  Substance and Sexual Activity  . Alcohol use: Not on file  . Drug use: Not on file  . Sexual activity: Not on file  Other Topics Concern  . Not on file  Social History Narrative  . Not on file   Social Determinants of Health   Financial Resource Strain: Not on file  Food Insecurity: Not on file  Transportation Needs: Not on file  Physical Activity: Not on file  Stress: Not on file  Social Connections: Not  on file      Review of Systems:  currently denies fever, headache, dyspnea, cough, abdominal pain, nausea, vomiting or bleeding.  She is hard of hearing and does have some tenderness of the right breast and right knee following recent fall, chronic back pain.  Vital Signs: BP (!) 169/64   Pulse 75   Temp 98.6 F (37 C) (Oral)   Resp 19   Ht 5' 4"  (1.626 m)   Wt 175 lb 3.2 oz (79.5 kg)   BMI 30.07 kg/m   Physical Exam awake, alert.  Chest clear to auscultation bilaterally.  Heart with regular rate and rhythm,   soft murmur.  Abdomen soft, positive bowel sounds, nontender.  No lower extremity edema.  Imaging: No results found.  Labs:  CBC: Recent Labs    06/20/20 0943 12/27/20 0807 01/04/21 0735  WBC 4.2 3.6* 4.3  HGB 12.3 11.7* 13.2  HCT 35.9* 34.3* 38.1  PLT 97* 73* 110*    COAGS: No results for input(s): INR, APTT in the last 8760 hours.  BMP: No results for input(s): NA, K, CL, CO2, GLUCOSE, BUN, CALCIUM, CREATININE, GFRNONAA, GFRAA in the last 8760 hours.  Invalid input(s): CMP  LIVER FUNCTION TESTS: No results for input(s): BILITOT, AST, ALT, ALKPHOS, PROT, ALBUMIN in the last 8760 hours.  TUMOR MARKERS: No results for input(s): AFPTM, CEA, CA199, CHROMGRNA in the last 8760 hours.  Assessment and Plan: 70 y.o. female with history of diabetes, degenerative disc disease, endometriosis, hypertension and now with pancytopenia of uncertain etiology.  She presents today for CT-guided bone marrow biopsy for further evaluation.Risks and benefits of procedure was discussed with the patient including, but not limited to bleeding, infection, damage to adjacent structures or low yield requiring additional tests.  All of the questions were answered and there is agreement to proceed.  Consent signed and in chart.     Thank you for this interesting consult.  I greatly enjoyed meeting GEANINE VANDEKAMP and look forward to participating in their care.  A copy of this report was sent to the requesting provider on this date.  Electronically Signed: D. Rowe Robert, PA-C 01/04/2021, 8:30 AM   I spent a total of  20 minutes   in face to face in clinical consultation, greater than 50% of which was counseling/coordinating care for CT-guided bone marrow biopsy

## 2021-01-09 LAB — SURGICAL PATHOLOGY

## 2021-01-16 ENCOUNTER — Encounter (HOSPITAL_COMMUNITY): Payer: Self-pay

## 2021-01-28 ENCOUNTER — Inpatient Hospital Stay: Payer: Medicare PPO | Attending: Internal Medicine | Admitting: Internal Medicine

## 2021-01-28 ENCOUNTER — Inpatient Hospital Stay: Payer: Medicare PPO

## 2021-01-28 ENCOUNTER — Other Ambulatory Visit: Payer: Self-pay

## 2021-01-28 VITALS — BP 169/65 | HR 69 | Temp 97.9°F | Resp 20 | Ht 64.0 in | Wt 176.6 lb

## 2021-01-28 DIAGNOSIS — D61818 Other pancytopenia: Secondary | ICD-10-CM | POA: Insufficient documentation

## 2021-01-28 DIAGNOSIS — Z794 Long term (current) use of insulin: Secondary | ICD-10-CM | POA: Insufficient documentation

## 2021-01-28 DIAGNOSIS — I10 Essential (primary) hypertension: Secondary | ICD-10-CM | POA: Diagnosis not present

## 2021-01-28 DIAGNOSIS — D696 Thrombocytopenia, unspecified: Secondary | ICD-10-CM

## 2021-01-28 DIAGNOSIS — R5383 Other fatigue: Secondary | ICD-10-CM | POA: Insufficient documentation

## 2021-01-28 DIAGNOSIS — R42 Dizziness and giddiness: Secondary | ICD-10-CM | POA: Insufficient documentation

## 2021-01-28 DIAGNOSIS — E119 Type 2 diabetes mellitus without complications: Secondary | ICD-10-CM | POA: Diagnosis not present

## 2021-01-28 DIAGNOSIS — K760 Fatty (change of) liver, not elsewhere classified: Secondary | ICD-10-CM | POA: Diagnosis not present

## 2021-01-28 DIAGNOSIS — Z7984 Long term (current) use of oral hypoglycemic drugs: Secondary | ICD-10-CM | POA: Diagnosis not present

## 2021-01-28 DIAGNOSIS — D539 Nutritional anemia, unspecified: Secondary | ICD-10-CM

## 2021-01-28 LAB — CBC WITH DIFFERENTIAL (CANCER CENTER ONLY)
Abs Immature Granulocytes: 0.01 10*3/uL (ref 0.00–0.07)
Basophils Absolute: 0 10*3/uL (ref 0.0–0.1)
Basophils Relative: 0 %
Eosinophils Absolute: 0.2 10*3/uL (ref 0.0–0.5)
Eosinophils Relative: 5 %
HCT: 34.9 % — ABNORMAL LOW (ref 36.0–46.0)
Hemoglobin: 12.1 g/dL (ref 12.0–15.0)
Immature Granulocytes: 0 %
Lymphocytes Relative: 30 %
Lymphs Abs: 1.1 10*3/uL (ref 0.7–4.0)
MCH: 32.4 pg (ref 26.0–34.0)
MCHC: 34.7 g/dL (ref 30.0–36.0)
MCV: 93.6 fL (ref 80.0–100.0)
Monocytes Absolute: 0.4 10*3/uL (ref 0.1–1.0)
Monocytes Relative: 10 %
Neutro Abs: 1.9 10*3/uL (ref 1.7–7.7)
Neutrophils Relative %: 55 %
Platelet Count: 74 10*3/uL — ABNORMAL LOW (ref 150–400)
RBC: 3.73 MIL/uL — ABNORMAL LOW (ref 3.87–5.11)
RDW: 13.6 % (ref 11.5–15.5)
WBC Count: 3.6 10*3/uL — ABNORMAL LOW (ref 4.0–10.5)
nRBC: 0 % (ref 0.0–0.2)

## 2021-01-28 LAB — FERRITIN: Ferritin: 33 ng/mL (ref 11–307)

## 2021-01-28 LAB — IRON AND TIBC
Iron: 69 ug/dL (ref 28–170)
Saturation Ratios: 17 % (ref 10.4–31.8)
TIBC: 397 ug/dL (ref 250–450)
UIBC: 328 ug/dL

## 2021-01-28 LAB — FOLATE: Folate: 12.2 ng/mL (ref >5.9)

## 2021-01-28 LAB — LACTATE DEHYDROGENASE: LDH: 177 U/L (ref 98–192)

## 2021-01-28 LAB — VITAMIN B12: Vitamin B-12: 364 pg/mL (ref 180–914)

## 2021-01-28 NOTE — Progress Notes (Signed)
[place] [phone]   [phone]  OFFICE PROGRESS NOTE  [name], MD [address] [place] [address]  DIAGNOSIS: Pancytopenia initially presented as thrombocytopenia, drug-induced versus ITP.  PRIOR THERAPY: None  CURRENT THERAPY: Observation.  INTERVAL HISTORY: Denise Klein 70 y.o. female returns to the clinic today for follow-up visit accompanied by her daughter-in-law Caryl Pina.  The patient is feeling fine today with no concerning complaints except for generalized fatigue and occasional dizzy spells.  She is followed by hematology for her liver disease.  The patient denied having any current chest pain, shortness of breath, cough or hemoptysis.  She denied having any fever or chills.  She has no nausea, vomiting, diarrhea or constipation.  She has no headache or visual changes.  She is here today for evaluation and repeat blood work.  She had bone marrow biopsy and aspirate recently.  MEDICAL HISTORY: Past Medical History:  Diagnosis Date   Broken ankle 1982   right   Broken wrist 1999   left (external fixator rod)   Carpal tunnel syndrome, right 2000   Degenerative disc disease at L5-S1 level    Diabetes ([place])    Endometriosis 1978   HTN (hypertension)    Thrombocytopenia ([facility])     ALLERGIES:  is allergic to acetaminophen, ibuprofen, meloxicam, molds & smuts, naproxen sodium, pentazocine, penicillin g, and sulfamethoxazole.  MEDICATIONS:  Current Outpatient Medications  Medication Sig Dispense Refill   ACCU-CHEK AVIVA PLUS test strip      cetirizine (ZYRTEC) 10 MG tablet Take 10 mg by mouth daily.     Cholecalciferol (VITAMIN D3) 25 MCG (1000 UT) CAPS Take 2 capsules by mouth in the morning and at bedtime.     Cholecalciferol 25 MCG (1000 UT) tablet Take 25 Units by mouth daily.     colesevelam (WELCHOL) 625 MG tablet Take 3 tablets by mouth 2 (two) times daily.     insulin degludec (TRESIBA  FLEXTOUCH) 100 UNIT/ML FlexTouch Pen Inject 167 Units into the skin daily.     metFORMIN (GLUCOPHAGE-XR) 500 MG 24 hr tablet Take 2 tablets by mouth in the morning and at bedtime.     metoprolol succinate (TOPROL-XL) 100 MG 24 hr tablet Take 1 tablet by mouth in the morning and at bedtime.      telmisartan-hydrochlorothiazide (MICARDIS HCT) 80-12.5 MG tablet Take 2 tablets by mouth daily.     triamcinolone ointment (KENALOG) 0.5 % Apply 1 application topically 2 (two) times daily.     No current facility-administered medications for this visit.    SURGICAL HISTORY:  Past Surgical History:  Procedure Laterality Date   ABDOMINAL HYSTERECTOMY  1999   CARPAL TUNNEL RELEASE Right    TUBAL LIGATION  1986   WRIST SURGERY      REVIEW OF SYSTEMS:  A comprehensive review of systems was negative except for: Constitutional: positive for fatigue Neurological: positive for dizziness   PHYSICAL EXAMINATION: General appearance: alert, cooperative, fatigued, and no distress Head: Normocephalic, without obvious abnormality, atraumatic Neck: no adenopathy, no JVD, supple, symmetrical, trachea midline, and thyroid not enlarged, symmetric, no tenderness/mass/nodules Lymph nodes: Cervical, supraclavicular, and axillary nodes normal. Resp: clear to auscultation bilaterally Back: symmetric, no curvature. ROM normal. No CVA tenderness. Cardio: regular rate and rhythm, S1, S2 normal, no murmur, click, rub or gallop GI: soft, non-tender; bowel sounds normal; no masses,  no organomegaly Extremities: extremities normal, atraumatic, no cyanosis or edema  ECOG PERFORMANCE STATUS:  1 - Symptomatic but completely ambulatory  Blood pressure (!) 169/65, pulse 69, temperature 97.9 F (36.6 C), temperature source Tympanic, resp. rate 20, height 5' 4"  (1.626 m), weight 176 lb 9.6 oz (80.1 kg), SpO2 100 %.  LABORATORY DATA: Lab Results  Component Value Date   WBC 3.6 (L) 01/28/2021   HGB 12.1 01/28/2021   HCT 34.9  (L) 01/28/2021   MCV 93.6 01/28/2021   PLT 74 (L) 01/28/2021      Chemistry      Component Value Date/Time   NA 143 11/17/2019 1216   K 3.7 11/17/2019 1216   CL 109 11/17/2019 1216   CO2 26 11/17/2019 1216   BUN 10 11/17/2019 1216   CREATININE 0.67 11/17/2019 1216      Component Value Date/Time   CALCIUM 9.0 11/17/2019 1216   ALKPHOS 84 11/17/2019 1216   AST 35 11/17/2019 1216   ALT 35 11/17/2019 1216   BILITOT 1.3 (H) 11/17/2019 1216       RADIOGRAPHIC STUDIES: CT Biopsy  Result Date: 29-Jan-2021 INDICATION: 70 year old female with a history of anemia referred for bone marrow biopsy EXAM: CT BIOPSY; CT BONE MARROW BIOPSY AND ASPIRATION MEDICATIONS: None. ANESTHESIA/SEDATION: Moderate (conscious) sedation was employed during this procedure. A total of Versed 2.0 mg and Fentanyl 100 mcg was administered intravenously. Moderate Sedation Time: 10 minutes. The patient's level of consciousness and vital signs were monitored continuously by radiology nursing throughout the procedure under my direct supervision. FLUOROSCOPY TIME:  CT COMPLICATIONS: None PROCEDURE: The procedure risks, benefits, and alternatives were explained to the patient. Questions regarding the procedure were encouraged and answered. The patient understands and consents to the procedure. Scout CT of the pelvis was performed for surgical planning purposes. The posterior pelvis was prepped with Chlorhexidine in a sterile fashion, and a sterile drape was applied covering the operative field. A sterile gown and sterile gloves were used for the procedure. Local anesthesia was provided with 1% Lidocaine. Posterior right iliac bone was targeted for biopsy. The skin and subcutaneous tissues were infiltrated with 1% lidocaine without epinephrine. A small stab incision was made with an 11 blade scalpel, and an 11 gauge Murphy needle was advanced with CT guidance to the posterior cortex. Manual forced was used to advance the needle  through the posterior cortex and the stylet was removed. A bone marrow aspirate was retrieved and passed to a cytotechnologist in the room. The Murphy needle was then advanced without the stylet for a core biopsy. The core biopsy was retrieved and also passed to a cytotechnologist. Manual pressure was used for hemostasis and a sterile dressing was placed. No complications were encountered no significant blood loss was encountered. Patient tolerated the procedure well and remained hemodynamically stable throughout. IMPRESSION: Status post CT-guided bone marrow biopsy, with tissue specimen sent to pathology for complete histopathologic analysis Signed, Dulcy Fanny. Earleen Newport, DO [facility] Radiology Electronically Signed   By: Corrie Mckusick D.O.   On: 01/29/2021 10:42   CT BONE MARROW BIOPSY & ASPIRATION  Result Date: 29-Jan-2021 INDICATION: 70 year old female with a history of anemia referred for bone marrow biopsy EXAM: CT BIOPSY; CT BONE MARROW BIOPSY AND ASPIRATION MEDICATIONS: None. ANESTHESIA/SEDATION: Moderate (conscious) sedation was employed during this procedure. A total of Versed 2.0 mg and Fentanyl 100 mcg was administered intravenously. Moderate Sedation Time: 10 minutes. The patient's level of consciousness and vital signs were monitored continuously by radiology nursing throughout the procedure under my direct supervision. FLUOROSCOPY TIME:  CT COMPLICATIONS: None PROCEDURE: The procedure risks, benefits, and alternatives were explained to the patient. Questions regarding the procedure were encouraged and answered. The patient understands and consents to the procedure. Scout CT of the pelvis was performed for surgical planning purposes. The posterior pelvis was prepped with Chlorhexidine in a sterile fashion, and a sterile drape was applied covering the operative field. A sterile gown and sterile gloves were used for the procedure. Local anesthesia was  provided with 1% Lidocaine. Posterior right iliac bone was targeted for biopsy. The skin and subcutaneous tissues were infiltrated with 1% lidocaine without epinephrine. A small stab incision was made with an 11 blade scalpel, and an 11 gauge Murphy needle was advanced with CT guidance to the posterior cortex. Manual forced was used to advance the needle through the posterior cortex and the stylet was removed. A bone marrow aspirate was retrieved and passed to a cytotechnologist in the room. The Murphy needle was then advanced without the stylet for a core biopsy. The core biopsy was retrieved and also passed to a cytotechnologist. Manual pressure was used for hemostasis and a sterile dressing was placed. No complications were encountered no significant blood loss was encountered. Patient tolerated the procedure well and remained hemodynamically stable throughout. IMPRESSION: Status post CT-guided bone marrow biopsy, with tissue specimen sent to pathology for complete histopathologic analysis Signed, Dulcy Fanny. Earleen Newport, DO [facility] Radiology Electronically Signed   By: Corrie Mckusick D.O.   On: 01/04/2021 10:42    ASSESSMENT AND PLAN: This is a very pleasant 70 years old white female who was initially evaluated for thrombocytopenia thought at that time to be drug-induced versus ITP. The patient is also currently undergoing evaluation of hepatic steatosis by the hepatology clinic. The patient underwent a bone marrow biopsy and aspirate.  I discussed the biopsy results as well as the blood work with the patient and her daughter-in-law.  Her biopsy showed no concerning underlying bone marrow abnormalities. Her thrombocytopenia is likely ITP and I will continue to monitor it closely.  The patient is currently asymptomatic. I will see her back for follow-up visit in 6 months for evaluation with repeat CBC and LDH. She was advised to call immediately if she has any  other concerning symptoms in the interval. The patient voices understanding of current disease status and treatment options and is in agreement with the current care plan.  All questions were answered. The patient knows to call the clinic with any problems, questions or concerns. We can certainly see the patient much sooner if necessary. The total time spent in the appointment was 35 minutes.  Disclaimer: This note was dictated with voice recognition software. Similar sounding words can inadvertently be transcribed and may not be corrected upon review.

## 2021-01-29 ENCOUNTER — Telehealth: Payer: Self-pay | Admitting: Internal Medicine

## 2021-01-29 NOTE — Telephone Encounter (Signed)
Scheduled per los. Called and left msg. Mailed printout  °

## 2021-02-07 DIAGNOSIS — K7460 Unspecified cirrhosis of liver: Secondary | ICD-10-CM | POA: Diagnosis not present

## 2021-02-07 DIAGNOSIS — K7581 Nonalcoholic steatohepatitis (NASH): Secondary | ICD-10-CM | POA: Diagnosis not present

## 2021-02-12 ENCOUNTER — Other Ambulatory Visit: Payer: Self-pay | Admitting: Nurse Practitioner

## 2021-02-12 DIAGNOSIS — K7460 Unspecified cirrhosis of liver: Secondary | ICD-10-CM

## 2021-03-05 ENCOUNTER — Ambulatory Visit
Admission: RE | Admit: 2021-03-05 | Discharge: 2021-03-05 | Disposition: A | Discharge status: Home / Self Care | Payer: Medicare PPO | Source: Ambulatory Visit | Attending: Nurse Practitioner | Admitting: Nurse Practitioner

## 2021-03-05 DIAGNOSIS — K7460 Unspecified cirrhosis of liver: Secondary | ICD-10-CM

## 2021-03-05 DIAGNOSIS — K760 Fatty (change of) liver, not elsewhere classified: Secondary | ICD-10-CM | POA: Diagnosis not present

## 2021-03-05 DIAGNOSIS — K7581 Nonalcoholic steatohepatitis (NASH): Secondary | ICD-10-CM | POA: Diagnosis not present

## 2021-04-24 DIAGNOSIS — I519 Heart disease, unspecified: Secondary | ICD-10-CM | POA: Diagnosis not present

## 2021-04-24 DIAGNOSIS — E782 Mixed hyperlipidemia: Secondary | ICD-10-CM | POA: Diagnosis not present

## 2021-04-24 DIAGNOSIS — I129 Hypertensive chronic kidney disease with stage 1 through stage 4 chronic kidney disease, or unspecified chronic kidney disease: Secondary | ICD-10-CM | POA: Diagnosis not present

## 2021-04-24 DIAGNOSIS — E1121 Type 2 diabetes mellitus with diabetic nephropathy: Secondary | ICD-10-CM | POA: Diagnosis not present

## 2021-04-24 DIAGNOSIS — K7460 Unspecified cirrhosis of liver: Secondary | ICD-10-CM | POA: Diagnosis not present

## 2021-04-24 DIAGNOSIS — N181 Chronic kidney disease, stage 1: Secondary | ICD-10-CM | POA: Diagnosis not present

## 2021-04-24 DIAGNOSIS — R809 Proteinuria, unspecified: Secondary | ICD-10-CM | POA: Diagnosis not present

## 2021-04-24 DIAGNOSIS — Z0000 Encounter for general adult medical examination without abnormal findings: Secondary | ICD-10-CM | POA: Diagnosis not present

## 2021-04-24 DIAGNOSIS — I350 Nonrheumatic aortic (valve) stenosis: Secondary | ICD-10-CM | POA: Diagnosis not present

## 2021-04-24 DIAGNOSIS — D693 Immune thrombocytopenic purpura: Secondary | ICD-10-CM | POA: Diagnosis not present

## 2021-05-03 DIAGNOSIS — Z9629 Presence of other otological and audiological implants: Secondary | ICD-10-CM | POA: Diagnosis not present

## 2021-05-03 DIAGNOSIS — H7292 Unspecified perforation of tympanic membrane, left ear: Secondary | ICD-10-CM | POA: Diagnosis not present

## 2021-05-03 DIAGNOSIS — H6591 Unspecified nonsuppurative otitis media, right ear: Secondary | ICD-10-CM | POA: Diagnosis not present

## 2021-05-03 DIAGNOSIS — H6983 Other specified disorders of Eustachian tube, bilateral: Secondary | ICD-10-CM | POA: Diagnosis not present

## 2021-05-03 DIAGNOSIS — H903 Sensorineural hearing loss, bilateral: Secondary | ICD-10-CM | POA: Diagnosis not present

## 2021-05-08 DIAGNOSIS — I129 Hypertensive chronic kidney disease with stage 1 through stage 4 chronic kidney disease, or unspecified chronic kidney disease: Secondary | ICD-10-CM | POA: Diagnosis not present

## 2021-05-08 DIAGNOSIS — Z23 Encounter for immunization: Secondary | ICD-10-CM | POA: Diagnosis not present

## 2021-06-24 DIAGNOSIS — Z1231 Encounter for screening mammogram for malignant neoplasm of breast: Secondary | ICD-10-CM | POA: Diagnosis not present

## 2021-06-28 DIAGNOSIS — R059 Cough, unspecified: Secondary | ICD-10-CM | POA: Diagnosis not present

## 2021-06-28 DIAGNOSIS — J0100 Acute maxillary sinusitis, unspecified: Secondary | ICD-10-CM | POA: Diagnosis not present

## 2021-07-11 DIAGNOSIS — R109 Unspecified abdominal pain: Secondary | ICD-10-CM | POA: Diagnosis not present

## 2021-07-11 DIAGNOSIS — R059 Cough, unspecified: Secondary | ICD-10-CM | POA: Diagnosis not present

## 2021-07-30 ENCOUNTER — Encounter: Payer: Self-pay | Admitting: Internal Medicine

## 2021-07-30 ENCOUNTER — Inpatient Hospital Stay: Payer: Medicare PPO

## 2021-07-30 ENCOUNTER — Other Ambulatory Visit: Payer: Self-pay

## 2021-07-30 ENCOUNTER — Inpatient Hospital Stay: Payer: Medicare PPO | Attending: Internal Medicine | Admitting: Internal Medicine

## 2021-07-30 VITALS — BP 160/58 | HR 71 | Temp 98.2°F | Resp 19 | Ht 64.0 in | Wt 175.8 lb

## 2021-07-30 DIAGNOSIS — E119 Type 2 diabetes mellitus without complications: Secondary | ICD-10-CM | POA: Insufficient documentation

## 2021-07-30 DIAGNOSIS — D61818 Other pancytopenia: Secondary | ICD-10-CM | POA: Insufficient documentation

## 2021-07-30 DIAGNOSIS — K760 Fatty (change of) liver, not elsewhere classified: Secondary | ICD-10-CM | POA: Diagnosis not present

## 2021-07-30 DIAGNOSIS — D693 Immune thrombocytopenic purpura: Secondary | ICD-10-CM | POA: Insufficient documentation

## 2021-07-30 DIAGNOSIS — D696 Thrombocytopenia, unspecified: Secondary | ICD-10-CM

## 2021-07-30 DIAGNOSIS — K7460 Unspecified cirrhosis of liver: Secondary | ICD-10-CM | POA: Insufficient documentation

## 2021-07-30 DIAGNOSIS — I10 Essential (primary) hypertension: Secondary | ICD-10-CM | POA: Insufficient documentation

## 2021-07-30 LAB — CBC WITH DIFFERENTIAL (CANCER CENTER ONLY)
Abs Immature Granulocytes: 0.01 10*3/uL (ref 0.00–0.07)
Basophils Absolute: 0 10*3/uL (ref 0.0–0.1)
Basophils Relative: 1 %
Eosinophils Absolute: 0.2 10*3/uL (ref 0.0–0.5)
Eosinophils Relative: 6 %
HCT: 33 % — ABNORMAL LOW (ref 36.0–46.0)
Hemoglobin: 11.6 g/dL — ABNORMAL LOW (ref 12.0–15.0)
Immature Granulocytes: 0 %
Lymphocytes Relative: 31 %
Lymphs Abs: 1.1 10*3/uL (ref 0.7–4.0)
MCH: 32.3 pg (ref 26.0–34.0)
MCHC: 35.2 g/dL (ref 30.0–36.0)
MCV: 91.9 fL (ref 80.0–100.0)
Monocytes Absolute: 0.5 10*3/uL (ref 0.1–1.0)
Monocytes Relative: 13 %
Neutro Abs: 1.8 10*3/uL (ref 1.7–7.7)
Neutrophils Relative %: 49 %
Platelet Count: 101 10*3/uL — ABNORMAL LOW (ref 150–400)
RBC: 3.59 MIL/uL — ABNORMAL LOW (ref 3.87–5.11)
RDW: 13.6 % (ref 11.5–15.5)
WBC Count: 3.7 10*3/uL — ABNORMAL LOW (ref 4.0–10.5)
nRBC: 0 % (ref 0.0–0.2)

## 2021-07-30 LAB — LACTATE DEHYDROGENASE: LDH: 190 U/L (ref 98–192)

## 2021-07-30 NOTE — Progress Notes (Signed)
[facility] [phone]   [phone]  OFFICE PROGRESS NOTE  [name], MD [address] [place] [address]  DIAGNOSIS: Pancytopenia initially presented as thrombocytopenia, secondary to liver cirrhosis/ITP.  PRIOR THERAPY: None  CURRENT THERAPY: Observation.  INTERVAL HISTORY: Denise Klein 70 y.o. female returns to the clinic today for follow-up visit.  The patient is feeling fine today with no concerning complaints except for the fatigue.  She is followed by the hematology clinic for nonalcoholic liver cirrhosis.  She denied having any chest pain, shortness of breath, cough or hemoptysis.  She denied having any fever or chills.  She has no nausea, vomiting, diarrhea or constipation.  She is here today for evaluation and repeat blood work.   MEDICAL HISTORY: Past Medical History:  Diagnosis Date   Broken ankle 1982   right   Broken wrist 1999   left (external fixator rod)   Carpal tunnel syndrome, right 2000   Degenerative disc disease at L5-S1 level    Diabetes ([place])    Endometriosis 1978   HTN (hypertension)    Thrombocytopenia (HCC)     ALLERGIES:  is allergic to sulfamethoxazole-trimethoprim, acetaminophen, ibuprofen, meloxicam, molds & smuts, naproxen sodium, pentazocine, propoxyphene, statins, lisinopril, penicillin g, and sulfamethoxazole.  MEDICATIONS:  Current Outpatient Medications  Medication Sig Dispense Refill   ACCU-CHEK AVIVA PLUS test strip      cetirizine (ZYRTEC) 10 MG tablet Take 10 mg by mouth daily.     Cholecalciferol 25 MCG (1000 UT) tablet Take 25 Units by mouth daily.     colesevelam (WELCHOL) 625 MG tablet Take 3 tablets by mouth 2 (two) times daily.     insulin degludec (TRESIBA FLEXTOUCH) 100 UNIT/ML FlexTouch Pen Inject 167 Units into the skin daily.     metFORMIN (GLUCOPHAGE-XR) 500 MG 24 hr tablet Take 2 tablets by mouth in the morning and at bedtime.     metoprolol  succinate (TOPROL-XL) 100 MG 24 hr tablet Take 1 tablet by mouth in the morning and at bedtime.      telmisartan-hydrochlorothiazide (MICARDIS HCT) 80-12.5 MG tablet Take 2 tablets by mouth daily.     triamcinolone ointment (KENALOG) 0.5 % Apply 1 application topically 2 (two) times daily.     No current facility-administered medications for this visit.    SURGICAL HISTORY:  Past Surgical History:  Procedure Laterality Date   ABDOMINAL HYSTERECTOMY  1999   CARPAL TUNNEL RELEASE Right    TUBAL LIGATION  1986   WRIST SURGERY      REVIEW OF SYSTEMS:  A comprehensive review of systems was negative except for: Constitutional: positive for fatigue   PHYSICAL EXAMINATION: General appearance: alert, cooperative, fatigued, and no distress Head: Normocephalic, without obvious abnormality, atraumatic Neck: no adenopathy, no JVD, supple, symmetrical, trachea midline, and thyroid not enlarged, symmetric, no tenderness/mass/nodules Lymph nodes: Cervical, supraclavicular, and axillary nodes normal. Resp: clear to auscultation bilaterally Back: symmetric, no curvature. ROM normal. No CVA tenderness. Cardio: regular rate and rhythm, S1, S2 normal, no murmur, click, rub or gallop GI: soft, non-tender; bowel sounds normal; no masses,  no organomegaly Extremities: extremities normal, atraumatic, no cyanosis or edema  ECOG PERFORMANCE STATUS: 1 - Symptomatic but completely ambulatory  Blood pressure (!) 160/58, pulse 71, temperature 98.2 F (36.8 C), temperature source Tympanic, resp. rate 19, height 5' 4"  (1.626 m), weight 175 lb 12.8 oz (79.7 kg), SpO2 100 %.  LABORATORY DATA: Lab Results  Component Value  Date   WBC 3.7 (L) 07/30/2021   HGB 11.6 (L) 07/30/2021   HCT 33.0 (L) 07/30/2021   MCV 91.9 07/30/2021   PLT 101 (L) 07/30/2021      Chemistry      Component Value Date/Time   NA 143 11/17/2019 1216   K 3.7 11/17/2019 1216   CL 109 11/17/2019 1216   CO2 26 11/17/2019 1216   BUN 10  11/17/2019 1216   CREATININE 0.67 11/17/2019 1216      Component Value Date/Time   CALCIUM 9.0 11/17/2019 1216   ALKPHOS 84 11/17/2019 1216   AST 35 11/17/2019 1216   ALT 35 11/17/2019 1216   BILITOT 1.3 (H) 11/17/2019 1216       RADIOGRAPHIC STUDIES: No results found.  ASSESSMENT AND PLAN: This is a very pleasant 70 years old white female who was initially evaluated for thrombocytopenia thought at that time to be drug-induced versus ITP. The patient is also currently undergoing evaluation of hepatic steatosis by the hepatology clinic. The patient underwent a bone marrow biopsy and aspirate. Her biopsy showed no concerning underlying bone marrow abnormalities. Her pancytopenia could be secondary to nonalcoholic liver cirrhosis with questionable ITP. Her CBC today showed a stable leukocytopenia and anemia with improvement of the platelet count. I recommended for the patient to continue on observation with repeat CBC and LDH in 6 months. She was advised to call immediately if she has any concerning symptoms in the interval especially any bleeding, bruises or ecchymosis.  The patient voices understanding of current disease status and treatment options and is in agreement with the current care plan.  All questions were answered. The patient knows to call the clinic with any problems, questions or concerns. We can certainly see the patient much sooner if necessary.   Disclaimer: This note was dictated with voice recognition software. Similar sounding words can inadvertently be transcribed and may not be corrected upon review.

## 2021-08-14 ENCOUNTER — Other Ambulatory Visit: Payer: Self-pay | Admitting: Nurse Practitioner

## 2021-08-14 DIAGNOSIS — G479 Sleep disorder, unspecified: Secondary | ICD-10-CM | POA: Diagnosis not present

## 2021-08-14 DIAGNOSIS — K7581 Nonalcoholic steatohepatitis (NASH): Secondary | ICD-10-CM | POA: Diagnosis not present

## 2021-08-14 DIAGNOSIS — K7460 Unspecified cirrhosis of liver: Secondary | ICD-10-CM | POA: Diagnosis not present

## 2021-08-14 DIAGNOSIS — L819 Disorder of pigmentation, unspecified: Secondary | ICD-10-CM | POA: Diagnosis not present

## 2021-09-04 ENCOUNTER — Ambulatory Visit
Admission: RE | Admit: 2021-09-04 | Discharge: 2021-09-04 | Disposition: A | Discharge status: Home / Self Care | Payer: Medicare PPO | Source: Ambulatory Visit | Attending: Nurse Practitioner | Admitting: Nurse Practitioner

## 2021-09-04 DIAGNOSIS — K7581 Nonalcoholic steatohepatitis (NASH): Secondary | ICD-10-CM

## 2021-09-04 DIAGNOSIS — K7460 Unspecified cirrhosis of liver: Secondary | ICD-10-CM | POA: Diagnosis not present

## 2021-10-23 DIAGNOSIS — K7460 Unspecified cirrhosis of liver: Secondary | ICD-10-CM | POA: Diagnosis not present

## 2021-10-23 DIAGNOSIS — E1121 Type 2 diabetes mellitus with diabetic nephropathy: Secondary | ICD-10-CM | POA: Diagnosis not present

## 2021-10-23 DIAGNOSIS — N181 Chronic kidney disease, stage 1: Secondary | ICD-10-CM | POA: Diagnosis not present

## 2021-10-23 DIAGNOSIS — E782 Mixed hyperlipidemia: Secondary | ICD-10-CM | POA: Diagnosis not present

## 2021-10-23 DIAGNOSIS — D693 Immune thrombocytopenic purpura: Secondary | ICD-10-CM | POA: Diagnosis not present

## 2021-10-23 DIAGNOSIS — I129 Hypertensive chronic kidney disease with stage 1 through stage 4 chronic kidney disease, or unspecified chronic kidney disease: Secondary | ICD-10-CM | POA: Diagnosis not present

## 2021-11-19 DIAGNOSIS — D485 Neoplasm of uncertain behavior of skin: Secondary | ICD-10-CM | POA: Diagnosis not present

## 2021-11-19 DIAGNOSIS — I129 Hypertensive chronic kidney disease with stage 1 through stage 4 chronic kidney disease, or unspecified chronic kidney disease: Secondary | ICD-10-CM | POA: Diagnosis not present

## 2021-11-21 DIAGNOSIS — J340 Abscess, furuncle and carbuncle of nose: Secondary | ICD-10-CM | POA: Diagnosis not present

## 2021-12-04 DIAGNOSIS — C44319 Basal cell carcinoma of skin of other parts of face: Secondary | ICD-10-CM | POA: Diagnosis not present

## 2021-12-04 DIAGNOSIS — L821 Other seborrheic keratosis: Secondary | ICD-10-CM | POA: Diagnosis not present

## 2021-12-17 DIAGNOSIS — C44329 Squamous cell carcinoma of skin of other parts of face: Secondary | ICD-10-CM | POA: Diagnosis not present

## 2022-01-28 ENCOUNTER — Inpatient Hospital Stay: Payer: Medicare PPO | Admitting: Internal Medicine

## 2022-01-28 ENCOUNTER — Other Ambulatory Visit: Payer: Self-pay

## 2022-01-28 ENCOUNTER — Inpatient Hospital Stay: Payer: Medicare PPO | Attending: Internal Medicine

## 2022-01-28 VITALS — BP 158/62 | HR 69 | Temp 98.0°F | Resp 16 | Ht 64.0 in | Wt 177.6 lb

## 2022-01-28 DIAGNOSIS — D693 Immune thrombocytopenic purpura: Secondary | ICD-10-CM | POA: Insufficient documentation

## 2022-01-28 DIAGNOSIS — Z7984 Long term (current) use of oral hypoglycemic drugs: Secondary | ICD-10-CM | POA: Insufficient documentation

## 2022-01-28 DIAGNOSIS — Z794 Long term (current) use of insulin: Secondary | ICD-10-CM | POA: Insufficient documentation

## 2022-01-28 DIAGNOSIS — K7460 Unspecified cirrhosis of liver: Secondary | ICD-10-CM | POA: Diagnosis not present

## 2022-01-28 DIAGNOSIS — D61818 Other pancytopenia: Secondary | ICD-10-CM | POA: Diagnosis not present

## 2022-01-28 DIAGNOSIS — Z79899 Other long term (current) drug therapy: Secondary | ICD-10-CM | POA: Diagnosis not present

## 2022-01-28 DIAGNOSIS — D696 Thrombocytopenia, unspecified: Secondary | ICD-10-CM

## 2022-01-28 DIAGNOSIS — R5383 Other fatigue: Secondary | ICD-10-CM | POA: Diagnosis not present

## 2022-01-28 LAB — CBC WITH DIFFERENTIAL (CANCER CENTER ONLY)
Abs Immature Granulocytes: 0 10*3/uL (ref 0.00–0.07)
Basophils Absolute: 0 10*3/uL (ref 0.0–0.1)
Basophils Relative: 1 %
Eosinophils Absolute: 0.2 10*3/uL (ref 0.0–0.5)
Eosinophils Relative: 5 %
HCT: 34.2 % — ABNORMAL LOW (ref 36.0–46.0)
Hemoglobin: 11.6 g/dL — ABNORMAL LOW (ref 12.0–15.0)
Immature Granulocytes: 0 %
Lymphocytes Relative: 29 %
Lymphs Abs: 1.1 10*3/uL (ref 0.7–4.0)
MCH: 31.6 pg (ref 26.0–34.0)
MCHC: 33.9 g/dL (ref 30.0–36.0)
MCV: 93.2 fL (ref 80.0–100.0)
Monocytes Absolute: 0.5 10*3/uL (ref 0.1–1.0)
Monocytes Relative: 12 %
Neutro Abs: 2 10*3/uL (ref 1.7–7.7)
Neutrophils Relative %: 53 %
Platelet Count: 92 10*3/uL — ABNORMAL LOW (ref 150–400)
RBC: 3.67 MIL/uL — ABNORMAL LOW (ref 3.87–5.11)
RDW: 14.3 % (ref 11.5–15.5)
WBC Count: 3.8 10*3/uL — ABNORMAL LOW (ref 4.0–10.5)
nRBC: 0 % (ref 0.0–0.2)

## 2022-01-28 NOTE — Progress Notes (Signed)
[facility] [phone]   [phone]  OFFICE PROGRESS NOTE  [name], MD [address] [place] [address]  DIAGNOSIS: Pancytopenia initially presented as thrombocytopenia, secondary to liver cirrhosis/ITP.  PRIOR THERAPY: None  CURRENT THERAPY: Observation.  INTERVAL HISTORY: Denise Klein 71 y.o. female returns to the clinic today for follow-up visit.  The patient is feeling fine today with no concerning complaints except for mild fatigue.  She is under a lot of stress recently after she lost her granddaughter who was 58 years old to glioblastoma.  She denied having any current chest pain, shortness of breath, cough or hemoptysis.  She has no nausea, vomiting, diarrhea or constipation.  She has no headache or visual changes.  She has no bleeding, bruises or ecchymosis.  The patient is here today for evaluation and repeat CBC.  MEDICAL HISTORY: Past Medical History:  Diagnosis Date   Broken ankle 1982   right   Broken wrist 1999   left (external fixator rod)   Carpal tunnel syndrome, right 2000   Degenerative disc disease at L5-S1 level    Diabetes ([place])    Endometriosis 1978   HTN (hypertension)    Thrombocytopenia (HCC)     ALLERGIES:  is allergic to sulfamethoxazole-trimethoprim, acetaminophen, ibuprofen, meloxicam, molds & smuts, naproxen sodium, pentazocine, propoxyphene, statins, lisinopril, penicillin g, and sulfamethoxazole.  MEDICATIONS:  Current Outpatient Medications  Medication Sig Dispense Refill   ACCU-CHEK AVIVA PLUS test strip      cetirizine (ZYRTEC) 10 MG tablet Take 10 mg by mouth daily.     Cholecalciferol 25 MCG (1000 UT) tablet Take 25 Units by mouth daily.     colesevelam (WELCHOL) 625 MG tablet Take 3 tablets by mouth 2 (two) times daily.     insulin degludec (TRESIBA FLEXTOUCH) 100 UNIT/ML FlexTouch Pen Inject 167 Units into the skin daily.     metFORMIN (GLUCOPHAGE-XR) 500 MG 24 hr  tablet Take 2 tablets by mouth in the morning and at bedtime.     metoprolol succinate (TOPROL-XL) 100 MG 24 hr tablet Take 1 tablet by mouth in the morning and at bedtime.      telmisartan-hydrochlorothiazide (MICARDIS HCT) 80-12.5 MG tablet Take 2 tablets by mouth daily.     triamcinolone ointment (KENALOG) 0.5 % Apply 1 application topically 2 (two) times daily.     No current facility-administered medications for this visit.    SURGICAL HISTORY:  Past Surgical History:  Procedure Laterality Date   ABDOMINAL HYSTERECTOMY  1999   CARPAL TUNNEL RELEASE Right    TUBAL LIGATION  1986   WRIST SURGERY      REVIEW OF SYSTEMS:  A comprehensive review of systems was negative except for: Constitutional: positive for fatigue   PHYSICAL EXAMINATION: General appearance: alert, cooperative, fatigued, and no distress Head: Normocephalic, without obvious abnormality, atraumatic Neck: no adenopathy, no JVD, supple, symmetrical, trachea midline, and thyroid not enlarged, symmetric, no tenderness/mass/nodules Lymph nodes: Cervical, supraclavicular, and axillary nodes normal. Resp: clear to auscultation bilaterally Back: symmetric, no curvature. ROM normal. No CVA tenderness. Cardio: regular rate and rhythm, S1, S2 normal, no murmur, click, rub or gallop GI: soft, non-tender; bowel sounds normal; no masses,  no organomegaly Extremities: extremities normal, atraumatic, no cyanosis or edema  ECOG PERFORMANCE STATUS: 1 - Symptomatic but completely ambulatory  Blood pressure (!) 158/62, pulse 69, temperature 98 F (36.7 C), temperature source Oral, resp. rate 16, height 5' 4"  (1.626 m), weight  177 lb 9.6 oz (80.6 kg), SpO2 97 %.  LABORATORY DATA: Lab Results  Component Value Date   WBC 3.8 (L) 01/28/2022   HGB 11.6 (L) 01/28/2022   HCT 34.2 (L) 01/28/2022   MCV 93.2 01/28/2022   PLT 92 (L) 01/28/2022      Chemistry      Component Value Date/Time   NA 143 11/17/2019 1216   K 3.7 11/17/2019  1216   CL 109 11/17/2019 1216   CO2 26 11/17/2019 1216   BUN 10 11/17/2019 1216   CREATININE 0.67 11/17/2019 1216      Component Value Date/Time   CALCIUM 9.0 11/17/2019 1216   ALKPHOS 84 11/17/2019 1216   AST 35 11/17/2019 1216   ALT 35 11/17/2019 1216   BILITOT 1.3 (H) 11/17/2019 1216       RADIOGRAPHIC STUDIES: No results found.  ASSESSMENT AND PLAN: This is a very pleasant 71 years old white female who was initially evaluated for thrombocytopenia thought at that time to be drug-induced versus ITP. The patient is also currently undergoing evaluation of hepatic steatosis by the hepatology clinic. The patient underwent a bone marrow biopsy and aspirate. Her biopsy showed no concerning underlying bone marrow abnormalities. Her pancytopenia could be secondary to nonalcoholic liver cirrhosis with questionable ITP. The patient is currently on observation and she is feeling fine with no concerning complaints. Repeat CBC today showed stable mild leukocytosis as well as mild anemia and thrombocytopenia. She is currently asymptomatic. I recommended for her to continue on observation with repeat blood work in 6 months. For the hypertension the patient was advised to reach out to her primary care physician for evaluation and adjustment of her medication if needed. She was advised to call immediately if she has any other concerning symptoms in the interval.  The patient voices understanding of current disease status and treatment options and is in agreement with the current care plan.  All questions were answered. The patient knows to call the clinic with any problems, questions or concerns. We can certainly see the patient much sooner if necessary.   Disclaimer: This note was dictated with voice recognition software. Similar sounding words can inadvertently be transcribed and may not be corrected upon review.

## 2022-02-17 ENCOUNTER — Other Ambulatory Visit: Payer: Self-pay | Admitting: Nurse Practitioner

## 2022-02-17 DIAGNOSIS — K7581 Nonalcoholic steatohepatitis (NASH): Secondary | ICD-10-CM | POA: Diagnosis not present

## 2022-02-17 DIAGNOSIS — K7460 Unspecified cirrhosis of liver: Secondary | ICD-10-CM | POA: Diagnosis not present

## 2022-02-26 ENCOUNTER — Ambulatory Visit
Admission: RE | Admit: 2022-02-26 | Discharge: 2022-02-26 | Disposition: A | Discharge status: Home / Self Care | Payer: Medicare PPO | Source: Ambulatory Visit | Attending: Nurse Practitioner | Admitting: Nurse Practitioner

## 2022-02-26 DIAGNOSIS — K7460 Unspecified cirrhosis of liver: Secondary | ICD-10-CM | POA: Diagnosis not present

## 2022-03-25 DIAGNOSIS — N3000 Acute cystitis without hematuria: Secondary | ICD-10-CM | POA: Diagnosis not present

## 2022-03-25 DIAGNOSIS — H6523 Chronic serous otitis media, bilateral: Secondary | ICD-10-CM | POA: Diagnosis not present

## 2022-03-25 DIAGNOSIS — C44329 Squamous cell carcinoma of skin of other parts of face: Secondary | ICD-10-CM | POA: Diagnosis not present

## 2022-03-25 DIAGNOSIS — L821 Other seborrheic keratosis: Secondary | ICD-10-CM | POA: Diagnosis not present

## 2022-03-25 DIAGNOSIS — L570 Actinic keratosis: Secondary | ICD-10-CM | POA: Diagnosis not present

## 2022-03-25 DIAGNOSIS — D2239 Melanocytic nevi of other parts of face: Secondary | ICD-10-CM | POA: Diagnosis not present

## 2022-03-25 DIAGNOSIS — L300 Nummular dermatitis: Secondary | ICD-10-CM | POA: Diagnosis not present

## 2022-03-25 DIAGNOSIS — R109 Unspecified abdominal pain: Secondary | ICD-10-CM | POA: Diagnosis not present

## 2022-04-17 DIAGNOSIS — D485 Neoplasm of uncertain behavior of skin: Secondary | ICD-10-CM | POA: Diagnosis not present

## 2022-04-17 DIAGNOSIS — D2239 Melanocytic nevi of other parts of face: Secondary | ICD-10-CM | POA: Diagnosis not present

## 2022-04-17 DIAGNOSIS — L821 Other seborrheic keratosis: Secondary | ICD-10-CM | POA: Diagnosis not present

## 2022-04-17 DIAGNOSIS — L905 Scar conditions and fibrosis of skin: Secondary | ICD-10-CM | POA: Diagnosis not present

## 2022-04-17 DIAGNOSIS — I788 Other diseases of capillaries: Secondary | ICD-10-CM | POA: Diagnosis not present

## 2022-04-17 DIAGNOSIS — D1722 Benign lipomatous neoplasm of skin and subcutaneous tissue of left arm: Secondary | ICD-10-CM | POA: Diagnosis not present

## 2022-04-17 DIAGNOSIS — L570 Actinic keratosis: Secondary | ICD-10-CM | POA: Diagnosis not present

## 2022-04-17 DIAGNOSIS — L820 Inflamed seborrheic keratosis: Secondary | ICD-10-CM | POA: Diagnosis not present

## 2022-04-17 DIAGNOSIS — L300 Nummular dermatitis: Secondary | ICD-10-CM | POA: Diagnosis not present

## 2022-05-07 ENCOUNTER — Other Ambulatory Visit: Payer: Self-pay | Admitting: Family Medicine

## 2022-05-07 ENCOUNTER — Ambulatory Visit
Admission: RE | Admit: 2022-05-07 | Discharge: 2022-05-07 | Disposition: A | Discharge status: Home / Self Care | Payer: Medicare PPO | Source: Ambulatory Visit | Attending: Family Medicine | Admitting: Family Medicine

## 2022-05-07 DIAGNOSIS — E1121 Type 2 diabetes mellitus with diabetic nephropathy: Secondary | ICD-10-CM | POA: Diagnosis not present

## 2022-05-07 DIAGNOSIS — Z0000 Encounter for general adult medical examination without abnormal findings: Secondary | ICD-10-CM | POA: Diagnosis not present

## 2022-05-07 DIAGNOSIS — R7611 Nonspecific reaction to tuberculin skin test without active tuberculosis: Secondary | ICD-10-CM | POA: Diagnosis not present

## 2022-05-07 DIAGNOSIS — I129 Hypertensive chronic kidney disease with stage 1 through stage 4 chronic kidney disease, or unspecified chronic kidney disease: Secondary | ICD-10-CM | POA: Diagnosis not present

## 2022-05-07 DIAGNOSIS — I350 Nonrheumatic aortic (valve) stenosis: Secondary | ICD-10-CM | POA: Diagnosis not present

## 2022-05-07 DIAGNOSIS — N181 Chronic kidney disease, stage 1: Secondary | ICD-10-CM | POA: Diagnosis not present

## 2022-05-07 DIAGNOSIS — I519 Heart disease, unspecified: Secondary | ICD-10-CM | POA: Diagnosis not present

## 2022-05-07 DIAGNOSIS — E782 Mixed hyperlipidemia: Secondary | ICD-10-CM | POA: Diagnosis not present

## 2022-05-07 DIAGNOSIS — J309 Allergic rhinitis, unspecified: Secondary | ICD-10-CM | POA: Diagnosis not present

## 2022-05-07 DIAGNOSIS — M810 Age-related osteoporosis without current pathological fracture: Secondary | ICD-10-CM | POA: Diagnosis not present

## 2022-05-08 DIAGNOSIS — H7292 Unspecified perforation of tympanic membrane, left ear: Secondary | ICD-10-CM | POA: Diagnosis not present

## 2022-05-08 DIAGNOSIS — H7202 Central perforation of tympanic membrane, left ear: Secondary | ICD-10-CM | POA: Diagnosis not present

## 2022-05-08 DIAGNOSIS — Z974 Presence of external hearing-aid: Secondary | ICD-10-CM | POA: Diagnosis not present

## 2022-05-08 DIAGNOSIS — H903 Sensorineural hearing loss, bilateral: Secondary | ICD-10-CM | POA: Diagnosis not present

## 2022-05-08 DIAGNOSIS — H6983 Other specified disorders of Eustachian tube, bilateral: Secondary | ICD-10-CM | POA: Diagnosis not present

## 2022-05-08 DIAGNOSIS — H6993 Unspecified Eustachian tube disorder, bilateral: Secondary | ICD-10-CM | POA: Diagnosis not present

## 2022-05-21 DIAGNOSIS — Z86010 Personal history of colonic polyps: Secondary | ICD-10-CM | POA: Diagnosis not present

## 2022-05-21 DIAGNOSIS — K7581 Nonalcoholic steatohepatitis (NASH): Secondary | ICD-10-CM | POA: Diagnosis not present

## 2022-05-21 DIAGNOSIS — K7460 Unspecified cirrhosis of liver: Secondary | ICD-10-CM | POA: Diagnosis not present

## 2022-05-21 DIAGNOSIS — D693 Immune thrombocytopenic purpura: Secondary | ICD-10-CM | POA: Diagnosis not present

## 2022-07-07 DIAGNOSIS — K648 Other hemorrhoids: Secondary | ICD-10-CM | POA: Diagnosis not present

## 2022-07-07 DIAGNOSIS — D128 Benign neoplasm of rectum: Secondary | ICD-10-CM | POA: Diagnosis not present

## 2022-07-07 DIAGNOSIS — K2970 Gastritis, unspecified, without bleeding: Secondary | ICD-10-CM | POA: Diagnosis not present

## 2022-07-07 DIAGNOSIS — Z09 Encounter for follow-up examination after completed treatment for conditions other than malignant neoplasm: Secondary | ICD-10-CM | POA: Diagnosis not present

## 2022-07-07 DIAGNOSIS — K3189 Other diseases of stomach and duodenum: Secondary | ICD-10-CM | POA: Diagnosis not present

## 2022-07-07 DIAGNOSIS — K7460 Unspecified cirrhosis of liver: Secondary | ICD-10-CM | POA: Diagnosis not present

## 2022-07-07 DIAGNOSIS — K766 Portal hypertension: Secondary | ICD-10-CM | POA: Diagnosis not present

## 2022-07-07 DIAGNOSIS — Z86010 Personal history of colonic polyps: Secondary | ICD-10-CM | POA: Diagnosis not present

## 2022-07-16 DIAGNOSIS — Z23 Encounter for immunization: Secondary | ICD-10-CM | POA: Diagnosis not present

## 2022-07-23 DIAGNOSIS — R059 Cough, unspecified: Secondary | ICD-10-CM | POA: Diagnosis not present

## 2022-07-23 DIAGNOSIS — H9202 Otalgia, left ear: Secondary | ICD-10-CM | POA: Diagnosis not present

## 2022-07-23 DIAGNOSIS — R0981 Nasal congestion: Secondary | ICD-10-CM | POA: Diagnosis not present

## 2022-07-25 DIAGNOSIS — Z1231 Encounter for screening mammogram for malignant neoplasm of breast: Secondary | ICD-10-CM | POA: Diagnosis not present

## 2022-07-30 ENCOUNTER — Inpatient Hospital Stay: Payer: Medicare PPO | Admitting: Internal Medicine

## 2022-07-30 ENCOUNTER — Inpatient Hospital Stay: Payer: Medicare PPO | Attending: Internal Medicine

## 2022-07-30 VITALS — BP 157/40 | HR 69 | Temp 98.2°F | Resp 17 | Wt 181.3 lb

## 2022-07-30 DIAGNOSIS — I10 Essential (primary) hypertension: Secondary | ICD-10-CM | POA: Diagnosis not present

## 2022-07-30 DIAGNOSIS — D61818 Other pancytopenia: Secondary | ICD-10-CM | POA: Insufficient documentation

## 2022-07-30 DIAGNOSIS — D696 Thrombocytopenia, unspecified: Secondary | ICD-10-CM

## 2022-07-30 DIAGNOSIS — K7460 Unspecified cirrhosis of liver: Secondary | ICD-10-CM | POA: Insufficient documentation

## 2022-07-30 DIAGNOSIS — D693 Immune thrombocytopenic purpura: Secondary | ICD-10-CM | POA: Diagnosis not present

## 2022-07-30 DIAGNOSIS — E119 Type 2 diabetes mellitus without complications: Secondary | ICD-10-CM | POA: Diagnosis not present

## 2022-07-30 LAB — CBC WITH DIFFERENTIAL (CANCER CENTER ONLY)
Abs Immature Granulocytes: 0.01 10*3/uL (ref 0.00–0.07)
Basophils Absolute: 0 10*3/uL (ref 0.0–0.1)
Basophils Relative: 0 %
Eosinophils Absolute: 0.1 10*3/uL (ref 0.0–0.5)
Eosinophils Relative: 2 %
HCT: 31.1 % — ABNORMAL LOW (ref 36.0–46.0)
Hemoglobin: 10.5 g/dL — ABNORMAL LOW (ref 12.0–15.0)
Immature Granulocytes: 0 %
Lymphocytes Relative: 22 %
Lymphs Abs: 1.7 10*3/uL (ref 0.7–4.0)
MCH: 31.3 pg (ref 26.0–34.0)
MCHC: 33.8 g/dL (ref 30.0–36.0)
MCV: 92.8 fL (ref 80.0–100.0)
Monocytes Absolute: 0.8 10*3/uL (ref 0.1–1.0)
Monocytes Relative: 11 %
Neutro Abs: 4.8 10*3/uL (ref 1.7–7.7)
Neutrophils Relative %: 65 %
Platelet Count: 108 10*3/uL — ABNORMAL LOW (ref 150–400)
RBC: 3.35 MIL/uL — ABNORMAL LOW (ref 3.87–5.11)
RDW: 15.1 % (ref 11.5–15.5)
WBC Count: 7.5 10*3/uL (ref 4.0–10.5)
nRBC: 0 % (ref 0.0–0.2)

## 2022-07-30 LAB — IRON AND IRON BINDING CAPACITY (CC-WL,HP ONLY)
Iron: 112 ug/dL (ref 28–170)
Saturation Ratios: 33 % — ABNORMAL HIGH (ref 10.4–31.8)
TIBC: 339 ug/dL (ref 250–450)
UIBC: 227 ug/dL (ref 148–442)

## 2022-07-30 LAB — FERRITIN: Ferritin: 20 ng/mL (ref 11–307)

## 2022-07-30 LAB — VITAMIN B12: Vitamin B-12: 427 pg/mL (ref 180–914)

## 2022-07-30 LAB — FOLATE: Folate: 10.8 ng/mL (ref >5.9)

## 2022-07-30 NOTE — Progress Notes (Signed)
    [facility] [phone]   [phone]  OFFICE PROGRESS NOTE  Shaw, Kimberlee, MD [address] [place] [address]  DIAGNOSIS: Pancytopenia initially presented as thrombocytopenia, secondary to liver cirrhosis/ITP.  PRIOR THERAPY: None  CURRENT THERAPY: Observation.  INTERVAL HISTORY: Denise Klein 71 y.o. female returns to the clinic today for follow-up visit accompanied by her husband.  The patient had a lot of complaints including increasing fatigue and weakness.  She had a colonoscopy and upper endoscopy by [facility] but unfortunately I do not have the report.  She received her flu shot on July 16, 2022 and she also had a mammogram performed and expected to have repeat 1 in few months.  She has no current nausea, vomiting, diarrhea or constipation.  She has no headache or visual changes.  She denied having any weight loss or night sweats.  She is here today for evaluation with repeat blood work.  MEDICAL HISTORY: Past Medical History:  Diagnosis Date   Broken ankle 1982   right   Broken wrist 1999   left (external fixator rod)   Carpal tunnel syndrome, right 2000   Degenerative disc disease at L5-S1 level    Diabetes (HCC)    Endometriosis 1978   HTN (hypertension)    Thrombocytopenia ([facility])     ALLERGIES:  is allergic to sulfamethoxazole-trimethoprim, acetaminophen, ibuprofen, meloxicam, molds & smuts, naproxen sodium, pentazocine, propoxyphene, statins, lisinopril, penicillin g, and sulfamethoxazole.  MEDICATIONS:  Current Outpatient Medications  Medication Sig Dispense Refill   ACCU-CHEK AVIVA PLUS test strip      amLODipine (NORVASC) 2.5 MG tablet Take 2.5 mg by mouth daily.     cetirizine (ZYRTEC) 10 MG tablet Take 10 mg by mouth daily.     Cholecalciferol (VITAMIN D3 PO) Take by mouth.     colesevelam (WELCHOL) 625 MG tablet Take 3 tablets by mouth 2 (two) times daily.     insulin degludec  (TRESIBA FLEXTOUCH) 100 UNIT/ML FlexTouch Pen Inject 167 Units into the skin daily.     metFORMIN (GLUCOPHAGE-XR) 500 MG 24 hr tablet Take 2 tablets by mouth in the morning and at bedtime.     metoprolol succinate (TOPROL-XL) 100 MG 24 hr tablet Take 1 tablet by mouth in the morning and at bedtime.      telmisartan-hydrochlorothiazide (MICARDIS HCT) 80-12.5 MG tablet Take 2 tablets by mouth daily.     triamcinolone ointment (KENALOG) 0.5 % Apply 1 application topically 2 (two) times daily. (Patient not taking: Reported on 01/28/2022)     No current facility-administered medications for this visit.    SURGICAL HISTORY:  Past Surgical History:  Procedure Laterality Date   ABDOMINAL HYSTERECTOMY  1999   CARPAL TUNNEL RELEASE Right    TUBAL LIGATION  1986   WRIST SURGERY      REVIEW OF SYSTEMS:  A comprehensive review of systems was negative except for: Constitutional: positive for fatigue   PHYSICAL EXAMINATION: General appearance: alert, cooperative, fatigued, and no distress Head: Normocephalic, without obvious abnormality, atraumatic Neck: no adenopathy, no JVD, supple, symmetrical, trachea midline, and thyroid not enlarged, symmetric, no tenderness/mass/nodules Lymph nodes: Cervical, supraclavicular, and axillary nodes normal. Resp: clear to auscultation bilaterally Back: symmetric, no curvature. ROM normal. No CVA tenderness. Cardio: regular rate and rhythm, S1, S2 normal, no murmur, click, rub or gallop GI: soft, non-tender; bowel sounds normal; no masses,  no organomegaly Extremities: extremities normal, atraumatic, no cyanosis or edema  ECOG PERFORMANCE   STATUS: 1 - Symptomatic but completely ambulatory  Blood pressure (!) 157/40, pulse 69, temperature 98.2 F (36.8 C), temperature source Oral, resp. rate 17, weight 181 lb 5 oz (82.2 kg), SpO2 100 %.  LABORATORY DATA: Lab Results  Component Value Date   WBC 7.5 07/30/2022   HGB 10.5 (L) 07/30/2022   HCT 31.1 (L) 07/30/2022    MCV 92.8 07/30/2022   PLT 108 (L) 07/30/2022      Chemistry      Component Value Date/Time   NA 143 11/17/2019 1216   K 3.7 11/17/2019 1216   CL 109 11/17/2019 1216   CO2 26 11/17/2019 1216   BUN 10 11/17/2019 1216   CREATININE 0.67 11/17/2019 1216      Component Value Date/Time   CALCIUM 9.0 11/17/2019 1216   ALKPHOS 84 11/17/2019 1216   AST 35 11/17/2019 1216   ALT 35 11/17/2019 1216   BILITOT 1.3 (H) 11/17/2019 1216       RADIOGRAPHIC STUDIES: No results found.  ASSESSMENT AND PLAN: This is a very pleasant 71 years old white female who was initially evaluated for thrombocytopenia thought at that time to be drug-induced versus ITP. The patient is also currently undergoing evaluation of hepatic steatosis by the hepatology clinic. The patient underwent a bone marrow biopsy and aspirate. Her biopsy showed no concerning underlying bone marrow abnormalities. The patient is currently on observation and she is doing fine except for fatigue. Repeat CBC today showed improvement of the total white blood count but she continues to have the mild anemia and thrombocytopenia secondary to liver cirrhosis.  Iron studies unremarkable today.  Serum folate and vitamin B12 are within the normal range. I recommended for the patient to continue on observation with close monitoring by her primary care physician from now on.  I do not see any additional intervention or treatment that I can provide the patient at this point since her condition is secondary to liver cirrhosis. She will also continue her routine follow-up visit with hematology. For the hypertension the patient was advised to reach out to her primary care physician for evaluation and adjustment of her medication if needed. She was advised to call immediately if she has any other concerning symptoms.  The patient voices understanding of current disease status and treatment options and is in agreement with the current care plan.  All  questions were answered. The patient knows to call the clinic with any problems, questions or concerns. We can certainly see the patient much sooner if necessary.   Disclaimer: This note was dictated with voice recognition software. Similar sounding words can inadvertently be transcribed and may not be corrected upon review.       

## 2022-07-30 NOTE — Addendum Note (Signed)
Addended by: Ardeen Garland on: 07/30/2022 03:57 PM   Modules accepted: Orders

## 2022-08-05 DIAGNOSIS — R921 Mammographic calcification found on diagnostic imaging of breast: Secondary | ICD-10-CM | POA: Diagnosis not present

## 2022-08-13 ENCOUNTER — Other Ambulatory Visit: Payer: Self-pay | Admitting: Radiology

## 2022-08-13 DIAGNOSIS — N6012 Diffuse cystic mastopathy of left breast: Secondary | ICD-10-CM | POA: Diagnosis not present

## 2022-08-13 DIAGNOSIS — R921 Mammographic calcification found on diagnostic imaging of breast: Secondary | ICD-10-CM | POA: Diagnosis not present

## 2022-08-13 DIAGNOSIS — D242 Benign neoplasm of left breast: Secondary | ICD-10-CM | POA: Diagnosis not present

## 2022-08-19 DIAGNOSIS — J019 Acute sinusitis, unspecified: Secondary | ICD-10-CM | POA: Diagnosis not present

## 2022-08-19 DIAGNOSIS — H6692 Otitis media, unspecified, left ear: Secondary | ICD-10-CM | POA: Diagnosis not present

## 2022-08-19 DIAGNOSIS — R0981 Nasal congestion: Secondary | ICD-10-CM | POA: Diagnosis not present

## 2022-10-15 DIAGNOSIS — L738 Other specified follicular disorders: Secondary | ICD-10-CM | POA: Diagnosis not present

## 2022-10-15 DIAGNOSIS — L300 Nummular dermatitis: Secondary | ICD-10-CM | POA: Diagnosis not present

## 2022-10-15 DIAGNOSIS — L570 Actinic keratosis: Secondary | ICD-10-CM | POA: Diagnosis not present

## 2022-10-15 DIAGNOSIS — Z85828 Personal history of other malignant neoplasm of skin: Secondary | ICD-10-CM | POA: Diagnosis not present

## 2022-10-15 DIAGNOSIS — L820 Inflamed seborrheic keratosis: Secondary | ICD-10-CM | POA: Diagnosis not present

## 2022-10-15 DIAGNOSIS — L821 Other seborrheic keratosis: Secondary | ICD-10-CM | POA: Diagnosis not present

## 2022-10-15 DIAGNOSIS — D1801 Hemangioma of skin and subcutaneous tissue: Secondary | ICD-10-CM | POA: Diagnosis not present

## 2022-10-29 DIAGNOSIS — Z888 Allergy status to other drugs, medicaments and biological substances status: Secondary | ICD-10-CM | POA: Diagnosis not present

## 2022-10-29 DIAGNOSIS — K7460 Unspecified cirrhosis of liver: Secondary | ICD-10-CM | POA: Diagnosis not present

## 2022-10-29 DIAGNOSIS — N181 Chronic kidney disease, stage 1: Secondary | ICD-10-CM | POA: Diagnosis not present

## 2022-10-29 DIAGNOSIS — D696 Thrombocytopenia, unspecified: Secondary | ICD-10-CM | POA: Diagnosis not present

## 2022-10-29 DIAGNOSIS — I129 Hypertensive chronic kidney disease with stage 1 through stage 4 chronic kidney disease, or unspecified chronic kidney disease: Secondary | ICD-10-CM | POA: Diagnosis not present

## 2022-10-29 DIAGNOSIS — E1122 Type 2 diabetes mellitus with diabetic chronic kidney disease: Secondary | ICD-10-CM | POA: Diagnosis not present

## 2022-10-29 DIAGNOSIS — K766 Portal hypertension: Secondary | ICD-10-CM | POA: Diagnosis not present

## 2022-10-29 DIAGNOSIS — R600 Localized edema: Secondary | ICD-10-CM | POA: Diagnosis not present

## 2022-10-29 DIAGNOSIS — E1121 Type 2 diabetes mellitus with diabetic nephropathy: Secondary | ICD-10-CM | POA: Diagnosis not present

## 2022-10-29 DIAGNOSIS — E782 Mixed hyperlipidemia: Secondary | ICD-10-CM | POA: Diagnosis not present

## 2022-10-30 ENCOUNTER — Other Ambulatory Visit: Payer: Self-pay | Admitting: Nurse Practitioner

## 2022-10-30 DIAGNOSIS — K766 Portal hypertension: Secondary | ICD-10-CM | POA: Diagnosis not present

## 2022-10-30 DIAGNOSIS — K7581 Nonalcoholic steatohepatitis (NASH): Secondary | ICD-10-CM | POA: Diagnosis not present

## 2022-10-30 DIAGNOSIS — K3189 Other diseases of stomach and duodenum: Secondary | ICD-10-CM | POA: Diagnosis not present

## 2022-10-30 DIAGNOSIS — K7460 Unspecified cirrhosis of liver: Secondary | ICD-10-CM

## 2022-10-30 DIAGNOSIS — R600 Localized edema: Secondary | ICD-10-CM | POA: Diagnosis not present

## 2022-11-13 DIAGNOSIS — K7460 Unspecified cirrhosis of liver: Secondary | ICD-10-CM | POA: Diagnosis not present

## 2022-11-13 DIAGNOSIS — K7581 Nonalcoholic steatohepatitis (NASH): Secondary | ICD-10-CM | POA: Diagnosis not present

## 2022-11-13 DIAGNOSIS — R600 Localized edema: Secondary | ICD-10-CM | POA: Diagnosis not present

## 2022-11-24 DIAGNOSIS — L309 Dermatitis, unspecified: Secondary | ICD-10-CM | POA: Diagnosis not present

## 2022-11-24 DIAGNOSIS — L821 Other seborrheic keratosis: Secondary | ICD-10-CM | POA: Diagnosis not present

## 2022-11-26 DIAGNOSIS — L299 Pruritus, unspecified: Secondary | ICD-10-CM | POA: Diagnosis not present

## 2022-11-26 DIAGNOSIS — H6690 Otitis media, unspecified, unspecified ear: Secondary | ICD-10-CM | POA: Diagnosis not present

## 2022-11-26 DIAGNOSIS — K7460 Unspecified cirrhosis of liver: Secondary | ICD-10-CM | POA: Diagnosis not present

## 2022-12-02 ENCOUNTER — Ambulatory Visit
Admission: RE | Admit: 2022-12-02 | Discharge: 2022-12-02 | Disposition: A | Discharge status: Home / Self Care | Payer: Medicare PPO | Source: Ambulatory Visit | Attending: Nurse Practitioner | Admitting: Nurse Practitioner

## 2022-12-02 DIAGNOSIS — K7460 Unspecified cirrhosis of liver: Secondary | ICD-10-CM

## 2022-12-02 DIAGNOSIS — K7581 Nonalcoholic steatohepatitis (NASH): Secondary | ICD-10-CM

## 2022-12-29 ENCOUNTER — Encounter (HOSPITAL_COMMUNITY)
Admission: EM | Disposition: A | Discharge status: Skilled Nursing Facility | Payer: Self-pay | Source: Home / Self Care | Attending: Family Medicine

## 2022-12-29 ENCOUNTER — Other Ambulatory Visit: Payer: Self-pay

## 2022-12-29 ENCOUNTER — Inpatient Hospital Stay (HOSPITAL_COMMUNITY): Payer: Medicare PPO | Admitting: Anesthesiology

## 2022-12-29 ENCOUNTER — Emergency Department (HOSPITAL_COMMUNITY): Payer: Medicare PPO

## 2022-12-29 ENCOUNTER — Inpatient Hospital Stay (HOSPITAL_COMMUNITY)
Admission: EM | Admit: 2022-12-29 | Discharge: 2023-01-02 | DRG: 482 | Disposition: A | Discharge status: Skilled Nursing Facility | Payer: Medicare PPO | Attending: Family Medicine | Admitting: Family Medicine

## 2022-12-29 ENCOUNTER — Encounter (HOSPITAL_COMMUNITY): Payer: Self-pay | Admitting: Emergency Medicine

## 2022-12-29 ENCOUNTER — Inpatient Hospital Stay (HOSPITAL_COMMUNITY): Payer: Medicare PPO

## 2022-12-29 DIAGNOSIS — E669 Obesity, unspecified: Secondary | ICD-10-CM | POA: Diagnosis not present

## 2022-12-29 DIAGNOSIS — Z794 Long term (current) use of insulin: Secondary | ICD-10-CM

## 2022-12-29 DIAGNOSIS — Z833 Family history of diabetes mellitus: Secondary | ICD-10-CM

## 2022-12-29 DIAGNOSIS — Z888 Allergy status to other drugs, medicaments and biological substances status: Secondary | ICD-10-CM | POA: Diagnosis not present

## 2022-12-29 DIAGNOSIS — S72001D Fracture of unspecified part of neck of right femur, subsequent encounter for closed fracture with routine healing: Secondary | ICD-10-CM | POA: Diagnosis not present

## 2022-12-29 DIAGNOSIS — W010XXA Fall on same level from slipping, tripping and stumbling without subsequent striking against object, initial encounter: Secondary | ICD-10-CM | POA: Diagnosis present

## 2022-12-29 DIAGNOSIS — S72001A Fracture of unspecified part of neck of right femur, initial encounter for closed fracture: Secondary | ICD-10-CM | POA: Diagnosis present

## 2022-12-29 DIAGNOSIS — Z8249 Family history of ischemic heart disease and other diseases of the circulatory system: Secondary | ICD-10-CM

## 2022-12-29 DIAGNOSIS — Z4889 Encounter for other specified surgical aftercare: Secondary | ICD-10-CM | POA: Diagnosis not present

## 2022-12-29 DIAGNOSIS — Z880 Allergy status to penicillin: Secondary | ICD-10-CM

## 2022-12-29 DIAGNOSIS — Z79899 Other long term (current) drug therapy: Secondary | ICD-10-CM | POA: Diagnosis not present

## 2022-12-29 DIAGNOSIS — Z886 Allergy status to analgesic agent status: Secondary | ICD-10-CM

## 2022-12-29 DIAGNOSIS — Z7984 Long term (current) use of oral hypoglycemic drugs: Secondary | ICD-10-CM

## 2022-12-29 DIAGNOSIS — E875 Hyperkalemia: Secondary | ICD-10-CM | POA: Diagnosis not present

## 2022-12-29 DIAGNOSIS — I10 Essential (primary) hypertension: Secondary | ICD-10-CM | POA: Diagnosis not present

## 2022-12-29 DIAGNOSIS — K7581 Nonalcoholic steatohepatitis (NASH): Secondary | ICD-10-CM | POA: Diagnosis not present

## 2022-12-29 DIAGNOSIS — D696 Thrombocytopenia, unspecified: Secondary | ICD-10-CM

## 2022-12-29 DIAGNOSIS — S72141A Displaced intertrochanteric fracture of right femur, initial encounter for closed fracture: Secondary | ICD-10-CM | POA: Diagnosis not present

## 2022-12-29 DIAGNOSIS — K7460 Unspecified cirrhosis of liver: Secondary | ICD-10-CM | POA: Diagnosis not present

## 2022-12-29 DIAGNOSIS — D649 Anemia, unspecified: Secondary | ICD-10-CM | POA: Diagnosis present

## 2022-12-29 DIAGNOSIS — M79604 Pain in right leg: Secondary | ICD-10-CM | POA: Diagnosis not present

## 2022-12-29 DIAGNOSIS — Z043 Encounter for examination and observation following other accident: Secondary | ICD-10-CM | POA: Diagnosis not present

## 2022-12-29 DIAGNOSIS — F32A Depression, unspecified: Secondary | ICD-10-CM | POA: Diagnosis present

## 2022-12-29 DIAGNOSIS — Z6831 Body mass index (BMI) 31.0-31.9, adult: Secondary | ICD-10-CM

## 2022-12-29 DIAGNOSIS — N289 Disorder of kidney and ureter, unspecified: Secondary | ICD-10-CM | POA: Diagnosis present

## 2022-12-29 DIAGNOSIS — Z882 Allergy status to sulfonamides status: Secondary | ICD-10-CM | POA: Diagnosis not present

## 2022-12-29 DIAGNOSIS — Z91048 Other nonmedicinal substance allergy status: Secondary | ICD-10-CM | POA: Diagnosis not present

## 2022-12-29 DIAGNOSIS — M25551 Pain in right hip: Secondary | ICD-10-CM | POA: Diagnosis not present

## 2022-12-29 DIAGNOSIS — S72141D Displaced intertrochanteric fracture of right femur, subsequent encounter for closed fracture with routine healing: Secondary | ICD-10-CM | POA: Diagnosis not present

## 2022-12-29 DIAGNOSIS — W19XXXA Unspecified fall, initial encounter: Secondary | ICD-10-CM | POA: Diagnosis not present

## 2022-12-29 DIAGNOSIS — R739 Hyperglycemia, unspecified: Secondary | ICD-10-CM | POA: Diagnosis not present

## 2022-12-29 DIAGNOSIS — E1149 Type 2 diabetes mellitus with other diabetic neurological complication: Secondary | ICD-10-CM | POA: Diagnosis not present

## 2022-12-29 DIAGNOSIS — E1169 Type 2 diabetes mellitus with other specified complication: Secondary | ICD-10-CM | POA: Diagnosis not present

## 2022-12-29 DIAGNOSIS — Z4789 Encounter for other orthopedic aftercare: Secondary | ICD-10-CM | POA: Diagnosis not present

## 2022-12-29 DIAGNOSIS — S8991XA Unspecified injury of right lower leg, initial encounter: Secondary | ICD-10-CM | POA: Diagnosis not present

## 2022-12-29 DIAGNOSIS — E119 Type 2 diabetes mellitus without complications: Secondary | ICD-10-CM | POA: Diagnosis present

## 2022-12-29 DIAGNOSIS — Y92009 Unspecified place in unspecified non-institutional (private) residence as the place of occurrence of the external cause: Secondary | ICD-10-CM

## 2022-12-29 DIAGNOSIS — Z7401 Bed confinement status: Secondary | ICD-10-CM | POA: Diagnosis not present

## 2022-12-29 DIAGNOSIS — M25572 Pain in left ankle and joints of left foot: Secondary | ICD-10-CM | POA: Diagnosis not present

## 2022-12-29 HISTORY — PX: INTRAMEDULLARY (IM) NAIL INTERTROCHANTERIC: SHX5875

## 2022-12-29 LAB — CBC WITH DIFFERENTIAL/PLATELET
Abs Immature Granulocytes: 0.03 10*3/uL (ref 0.00–0.07)
Basophils Absolute: 0 10*3/uL (ref 0.0–0.1)
Basophils Relative: 1 %
Eosinophils Absolute: 0.3 10*3/uL (ref 0.0–0.5)
Eosinophils Relative: 5 %
HCT: 33.9 % — ABNORMAL LOW (ref 36.0–46.0)
Hemoglobin: 11.3 g/dL — ABNORMAL LOW (ref 12.0–15.0)
Immature Granulocytes: 1 %
Lymphocytes Relative: 25 %
Lymphs Abs: 1.4 10*3/uL (ref 0.7–4.0)
MCH: 31 pg (ref 26.0–34.0)
MCHC: 33.3 g/dL (ref 30.0–36.0)
MCV: 92.9 fL (ref 80.0–100.0)
Monocytes Absolute: 0.4 10*3/uL (ref 0.1–1.0)
Monocytes Relative: 8 %
Neutro Abs: 3.3 10*3/uL (ref 1.7–7.7)
Neutrophils Relative %: 60 %
Platelets: 79 10*3/uL — ABNORMAL LOW (ref 150–400)
RBC: 3.65 MIL/uL — ABNORMAL LOW (ref 3.87–5.11)
RDW: 14.6 % (ref 11.5–15.5)
WBC: 5.5 10*3/uL (ref 4.0–10.5)
nRBC: 0 % (ref 0.0–0.2)

## 2022-12-29 LAB — GLUCOSE, CAPILLARY
Glucose-Capillary: 149 mg/dL — ABNORMAL HIGH (ref 70–99)
Glucose-Capillary: 126 mg/dL — ABNORMAL HIGH (ref 70–99)
Glucose-Capillary: 163 mg/dL — ABNORMAL HIGH (ref 70–99)
Glucose-Capillary: 145 mg/dL — ABNORMAL HIGH (ref 70–99)

## 2022-12-29 LAB — TYPE AND SCREEN
ABO/RH(D): O POS
Antibody Screen: NEGATIVE

## 2022-12-29 LAB — PROTIME-INR
INR: 1.4 — ABNORMAL HIGH (ref 0.8–1.2)
Prothrombin Time: 17.5 seconds — ABNORMAL HIGH (ref 11.4–15.2)

## 2022-12-29 LAB — BASIC METABOLIC PANEL
Anion gap: 10 (ref 5–15)
BUN: 17 mg/dL (ref 8–23)
CO2: 17 mmol/L — ABNORMAL LOW (ref 22–32)
Calcium: 8.6 mg/dL — ABNORMAL LOW (ref 8.9–10.3)
Chloride: 109 mmol/L (ref 98–111)
Creatinine, Ser: 0.82 mg/dL (ref 0.44–1.00)
GFR, Estimated: >60 mL/min (ref >60)
Glucose, Bld: 214 mg/dL — ABNORMAL HIGH (ref 70–99)
Potassium: 4.5 mmol/L (ref 3.5–5.1)
Sodium: 136 mmol/L (ref 135–145)

## 2022-12-29 LAB — SURGICAL PCR SCREEN
MRSA, PCR: NEGATIVE
Staphylococcus aureus: NEGATIVE

## 2022-12-29 LAB — ABO/RH: ABO/RH(D): O POS

## 2022-12-29 LAB — BPAM PLATELET PHERESIS: Blood Product Expiration Date: 202405212359

## 2022-12-29 LAB — CBG MONITORING, ED: Glucose-Capillary: 191 mg/dL — ABNORMAL HIGH (ref 70–99)

## 2022-12-29 SURGERY — FIXATION, FRACTURE, INTERTROCHANTERIC, WITH INTRAMEDULLARY ROD
Anesthesia: General | Site: Leg Upper | Laterality: Right

## 2022-12-29 MED ORDER — LACTATED RINGERS IV SOLN: INTRAVENOUS | Status: DC

## 2022-12-29 MED ORDER — DOCUSATE SODIUM 100 MG PO CAPS
100.0000 mg | ORAL_CAPSULE | Freq: Two times a day (BID) | ORAL | Status: DC
  Administered 2022-12-29 – 2023-01-02 (×8): 100 mg via ORAL
  Filled 2022-12-29 (×9): qty 1

## 2022-12-29 MED ORDER — FUROSEMIDE 40 MG PO TABS
40.0000 mg | ORAL_TABLET | Freq: Every day | ORAL | Status: DC
  Administered 2022-12-29 – 2022-12-31 (×3): 40 mg via ORAL
  Filled 2022-12-29 (×3): qty 1

## 2022-12-29 MED ORDER — OXYCODONE HCL 5 MG PO TABS: 5.0000 mg | ORAL_TABLET | Freq: Once | ORAL | Status: DC | PRN

## 2022-12-29 MED ORDER — MENTHOL 3 MG MT LOZG: 1.0000 | LOZENGE | OROMUCOSAL | Status: DC | PRN

## 2022-12-29 MED ORDER — EPHEDRINE SULFATE-NACL 50-0.9 MG/10ML-% IV SOSY
PREFILLED_SYRINGE | INTRAVENOUS | Status: DC | PRN
  Administered 2022-12-29: 5 mg via INTRAVENOUS

## 2022-12-29 MED ORDER — HYDROMORPHONE HCL 1 MG/ML IJ SOLN
INTRAMUSCULAR | Status: AC
  Filled 2022-12-29: qty 1

## 2022-12-29 MED ORDER — ROCURONIUM BROMIDE 10 MG/ML (PF) SYRINGE
PREFILLED_SYRINGE | INTRAVENOUS | Status: AC
  Filled 2022-12-29: qty 10

## 2022-12-29 MED ORDER — FENTANYL CITRATE PF 50 MCG/ML IJ SOSY
50.0000 ug | PREFILLED_SYRINGE | INTRAMUSCULAR | Status: DC | PRN
  Administered 2022-12-29: 50 ug via INTRAVENOUS
  Filled 2022-12-29: qty 1

## 2022-12-29 MED ORDER — MORPHINE SULFATE (PF) 2 MG/ML IV SOLN
0.5000 mg | INTRAVENOUS | Status: DC | PRN
  Administered 2022-12-29: 0.5 mg via INTRAVENOUS
  Filled 2022-12-29: qty 1

## 2022-12-29 MED ORDER — PROPOFOL 10 MG/ML IV BOLUS
INTRAVENOUS | Status: AC
  Filled 2022-12-29: qty 20

## 2022-12-29 MED ORDER — INSULIN GLARGINE-YFGN 100 UNIT/ML ~~LOC~~ SOLN
40.0000 [IU] | Freq: Every day | SUBCUTANEOUS | Status: DC
  Administered 2022-12-30 – 2022-12-31 (×2): 40 [IU] via SUBCUTANEOUS
  Filled 2022-12-29 (×3): qty 0.4

## 2022-12-29 MED ORDER — INSULIN ASPART 100 UNIT/ML IJ SOLN
0.0000 [IU] | Freq: Every day | INTRAMUSCULAR | Status: DC
  Administered 2022-12-30: 2 [IU] via SUBCUTANEOUS
  Administered 2022-12-31: 5 [IU] via SUBCUTANEOUS
  Administered 2023-01-01: 4 [IU] via SUBCUTANEOUS

## 2022-12-29 MED ORDER — SPIRONOLACTONE 25 MG PO TABS
100.0000 mg | ORAL_TABLET | Freq: Every day | ORAL | Status: DC
  Administered 2022-12-30 – 2022-12-31 (×2): 100 mg via ORAL
  Filled 2022-12-29 (×2): qty 4

## 2022-12-29 MED ORDER — SUGAMMADEX SODIUM 200 MG/2ML IV SOLN
INTRAVENOUS | Status: DC | PRN
  Administered 2022-12-29: 200 mg via INTRAVENOUS

## 2022-12-29 MED ORDER — CHLORHEXIDINE GLUCONATE 0.12 % MT SOLN: 15.0000 mL | Freq: Once | OROMUCOSAL | Status: AC

## 2022-12-29 MED ORDER — FENTANYL CITRATE (PF) 100 MCG/2ML IJ SOLN: 50.0000 ug | Freq: Once | INTRAMUSCULAR | Status: AC

## 2022-12-29 MED ORDER — DEXAMETHASONE SODIUM PHOSPHATE 10 MG/ML IJ SOLN
INTRAMUSCULAR | Status: DC | PRN
  Administered 2022-12-29: 4 mg via INTRAVENOUS

## 2022-12-29 MED ORDER — PROPOFOL 10 MG/ML IV BOLUS
INTRAVENOUS | Status: DC | PRN
  Administered 2022-12-29: 100 mg via INTRAVENOUS

## 2022-12-29 MED ORDER — METOCLOPRAMIDE HCL 5 MG PO TABS: 5.0000 mg | ORAL_TABLET | Freq: Three times a day (TID) | ORAL | Status: DC | PRN

## 2022-12-29 MED ORDER — POVIDONE-IODINE 10 % EX SWAB
2.0000 | Freq: Once | CUTANEOUS | Status: AC
  Administered 2022-12-29: 2 via TOPICAL

## 2022-12-29 MED ORDER — METOCLOPRAMIDE HCL 5 MG/ML IJ SOLN: 5.0000 mg | Freq: Three times a day (TID) | INTRAMUSCULAR | Status: DC | PRN

## 2022-12-29 MED ORDER — TRANEXAMIC ACID-NACL 1000-0.7 MG/100ML-% IV SOLN
1000.0000 mg | Freq: Once | INTRAVENOUS | Status: AC
  Administered 2022-12-29: 1000 mg via INTRAVENOUS
  Filled 2022-12-29: qty 100

## 2022-12-29 MED ORDER — TELMISARTAN-HCTZ 80-12.5 MG PO TABS: 2.0000 | ORAL_TABLET | Freq: Every day | ORAL | Status: DC

## 2022-12-29 MED ORDER — METOPROLOL SUCCINATE ER 50 MG PO TB24
100.0000 mg | ORAL_TABLET | Freq: Every day | ORAL | Status: DC
  Administered 2022-12-29 – 2023-01-02 (×4): 100 mg via ORAL
  Filled 2022-12-29 (×4): qty 2

## 2022-12-29 MED ORDER — HYDROCHLOROTHIAZIDE 25 MG PO TABS: 25.0000 mg | ORAL_TABLET | Freq: Every day | ORAL | Status: DC

## 2022-12-29 MED ORDER — ONDANSETRON HCL 4 MG/2ML IJ SOLN
4.0000 mg | Freq: Four times a day (QID) | INTRAMUSCULAR | Status: DC | PRN
  Administered 2022-12-29: 4 mg via INTRAVENOUS
  Filled 2022-12-29 (×2): qty 2

## 2022-12-29 MED ORDER — FENTANYL CITRATE (PF) 250 MCG/5ML IJ SOLN
INTRAMUSCULAR | Status: AC
  Filled 2022-12-29: qty 5

## 2022-12-29 MED ORDER — ONDANSETRON HCL 4 MG/2ML IJ SOLN
4.0000 mg | Freq: Four times a day (QID) | INTRAMUSCULAR | Status: DC | PRN
  Administered 2022-12-30: 4 mg via INTRAVENOUS

## 2022-12-29 MED ORDER — HYDROMORPHONE HCL 1 MG/ML IJ SOLN
0.2500 mg | INTRAMUSCULAR | Status: DC | PRN
  Administered 2022-12-29 (×2): 0.5 mg via INTRAVENOUS

## 2022-12-29 MED ORDER — TRANEXAMIC ACID-NACL 1000-0.7 MG/100ML-% IV SOLN
INTRAVENOUS | Status: AC
  Filled 2022-12-29: qty 100

## 2022-12-29 MED ORDER — LIDOCAINE 2% (20 MG/ML) 5 ML SYRINGE
INTRAMUSCULAR | Status: AC
  Filled 2022-12-29: qty 5

## 2022-12-29 MED ORDER — ROCURONIUM BROMIDE 10 MG/ML (PF) SYRINGE
PREFILLED_SYRINGE | INTRAVENOUS | Status: DC | PRN
  Administered 2022-12-29: 60 mg via INTRAVENOUS

## 2022-12-29 MED ORDER — TRANEXAMIC ACID-NACL 1000-0.7 MG/100ML-% IV SOLN
1000.0000 mg | INTRAVENOUS | Status: AC
  Administered 2022-12-29: 1000 mg via INTRAVENOUS

## 2022-12-29 MED ORDER — LORATADINE 10 MG PO TABS
10.0000 mg | ORAL_TABLET | Freq: Every day | ORAL | Status: DC
  Administered 2022-12-29 – 2023-01-02 (×5): 10 mg via ORAL
  Filled 2022-12-29 (×5): qty 1

## 2022-12-29 MED ORDER — IRBESARTAN 300 MG PO TABS
300.0000 mg | ORAL_TABLET | Freq: Every day | ORAL | Status: DC
  Administered 2022-12-31: 300 mg via ORAL
  Filled 2022-12-29: qty 1

## 2022-12-29 MED ORDER — LIDOCAINE 2% (20 MG/ML) 5 ML SYRINGE
INTRAMUSCULAR | Status: DC | PRN
  Administered 2022-12-29: 100 mg via INTRAVENOUS

## 2022-12-29 MED ORDER — AMLODIPINE BESYLATE 2.5 MG PO TABS
2.5000 mg | ORAL_TABLET | Freq: Every day | ORAL | Status: DC
  Administered 2023-01-01 – 2023-01-02 (×2): 2.5 mg via ORAL
  Filled 2022-12-29 (×3): qty 1

## 2022-12-29 MED ORDER — ONDANSETRON HCL 4 MG PO TABS
4.0000 mg | ORAL_TABLET | Freq: Four times a day (QID) | ORAL | Status: DC | PRN
  Administered 2022-12-30 (×2): 4 mg via ORAL
  Filled 2022-12-29 (×2): qty 1

## 2022-12-29 MED ORDER — FENTANYL CITRATE (PF) 100 MCG/2ML IJ SOLN
INTRAMUSCULAR | Status: DC | PRN
  Administered 2022-12-29 (×2): 50 ug via INTRAVENOUS

## 2022-12-29 MED ORDER — CEFAZOLIN SODIUM-DEXTROSE 2-4 GM/100ML-% IV SOLN
2.0000 g | INTRAVENOUS | Status: AC
  Administered 2022-12-29: 2 g via INTRAVENOUS

## 2022-12-29 MED ORDER — AMISULPRIDE (ANTIEMETIC) 5 MG/2ML IV SOLN: 10.0000 mg | Freq: Once | INTRAVENOUS | Status: DC | PRN

## 2022-12-29 MED ORDER — OXYCODONE HCL 5 MG PO TABS
5.0000 mg | ORAL_TABLET | ORAL | Status: DC | PRN
  Administered 2022-12-30 (×4): 10 mg via ORAL
  Administered 2022-12-31: 5 mg via ORAL
  Administered 2023-01-01: 10 mg via ORAL
  Administered 2023-01-01: 5 mg via ORAL
  Administered 2023-01-02 (×2): 10 mg via ORAL
  Filled 2022-12-29 (×2): qty 2
  Filled 2022-12-29 (×2): qty 1
  Filled 2022-12-29 (×5): qty 2
  Filled 2022-12-29: qty 1

## 2022-12-29 MED ORDER — DEXAMETHASONE SODIUM PHOSPHATE 10 MG/ML IJ SOLN
INTRAMUSCULAR | Status: AC
  Filled 2022-12-29: qty 1

## 2022-12-29 MED ORDER — SODIUM CHLORIDE 0.9 % IV SOLN: INTRAVENOUS | Status: DC | PRN

## 2022-12-29 MED ORDER — EPHEDRINE 5 MG/ML INJ
INTRAVENOUS | Status: AC
  Filled 2022-12-29: qty 5

## 2022-12-29 MED ORDER — ONDANSETRON HCL 4 MG/2ML IJ SOLN
INTRAMUSCULAR | Status: AC
  Filled 2022-12-29: qty 2

## 2022-12-29 MED ORDER — SODIUM CHLORIDE 0.9% IV SOLUTION: Freq: Once | INTRAVENOUS | Status: AC

## 2022-12-29 MED ORDER — ORAL CARE MOUTH RINSE: 15.0000 mL | Freq: Once | OROMUCOSAL | Status: AC

## 2022-12-29 MED ORDER — COLESEVELAM HCL 625 MG PO TABS
1875.0000 mg | ORAL_TABLET | Freq: Two times a day (BID) | ORAL | Status: DC
  Administered 2022-12-29 – 2023-01-02 (×8): 1875 mg via ORAL
  Filled 2022-12-29 (×9): qty 3

## 2022-12-29 MED ORDER — CHLORHEXIDINE GLUCONATE 0.12 % MT SOLN
OROMUCOSAL | Status: AC
  Administered 2022-12-29: 15 mL via OROMUCOSAL
  Filled 2022-12-29: qty 15

## 2022-12-29 MED ORDER — INSULIN ASPART 100 UNIT/ML IJ SOLN
0.0000 [IU] | Freq: Three times a day (TID) | INTRAMUSCULAR | Status: DC
  Administered 2022-12-30 (×2): 3 [IU] via SUBCUTANEOUS
  Administered 2022-12-30: 5 [IU] via SUBCUTANEOUS
  Administered 2022-12-31: 11 [IU] via SUBCUTANEOUS
  Administered 2022-12-31: 3 [IU] via SUBCUTANEOUS
  Administered 2022-12-31: 8 [IU] via SUBCUTANEOUS
  Administered 2023-01-01: 15 [IU] via SUBCUTANEOUS
  Administered 2023-01-01: 5 [IU] via SUBCUTANEOUS
  Administered 2023-01-01: 11 [IU] via SUBCUTANEOUS
  Administered 2023-01-02: 8 [IU] via SUBCUTANEOUS
  Administered 2023-01-02: 3 [IU] via SUBCUTANEOUS

## 2022-12-29 MED ORDER — OXYCODONE HCL 5 MG/5ML PO SOLN: 5.0000 mg | Freq: Once | ORAL | Status: DC | PRN

## 2022-12-29 MED ORDER — CHLORHEXIDINE GLUCONATE 4 % EX SOLN: 60.0000 mL | Freq: Once | CUTANEOUS | Status: DC

## 2022-12-29 MED ORDER — CEFAZOLIN SODIUM-DEXTROSE 2-4 GM/100ML-% IV SOLN
2.0000 g | Freq: Four times a day (QID) | INTRAVENOUS | Status: AC
  Administered 2022-12-29 – 2022-12-30 (×2): 2 g via INTRAVENOUS
  Filled 2022-12-29 (×2): qty 100

## 2022-12-29 MED ORDER — PHENOL 1.4 % MT LIQD: 1.0000 | OROMUCOSAL | Status: DC | PRN

## 2022-12-29 MED ORDER — CEFAZOLIN SODIUM-DEXTROSE 2-4 GM/100ML-% IV SOLN
INTRAVENOUS | Status: AC
  Filled 2022-12-29: qty 100

## 2022-12-29 MED ORDER — FENTANYL CITRATE (PF) 100 MCG/2ML IJ SOLN
INTRAMUSCULAR | Status: AC
  Administered 2022-12-29: 50 ug via INTRAVENOUS
  Filled 2022-12-29: qty 2

## 2022-12-29 MED ORDER — SODIUM CHLORIDE 0.9% IV SOLUTION: Freq: Once | INTRAVENOUS | Status: DC

## 2022-12-29 MED ORDER — 0.9 % SODIUM CHLORIDE (POUR BTL) OPTIME
TOPICAL | Status: DC | PRN
  Administered 2022-12-29: 1000 mL

## 2022-12-29 SURGICAL SUPPLY — 41 items
ALCOHOL 70% 16 OZ (MISCELLANEOUS) ×1 IMPLANT
BAG COUNTER SPONGE SURGICOUNT (BAG) ×1 IMPLANT
BIT DRILL 4.0X280 (BIT) IMPLANT
BNDG COHESIVE 6X5 TAN ST LF (GAUZE/BANDAGES/DRESSINGS) ×2 IMPLANT
CANISTER SUCT 3000ML PPV (MISCELLANEOUS) ×1 IMPLANT
COVER PERINEAL POST (MISCELLANEOUS) ×1 IMPLANT
COVER SURGICAL LIGHT HANDLE (MISCELLANEOUS) ×1 IMPLANT
DRAPE C-ARM 42X72 X-RAY (DRAPES) ×1 IMPLANT
DRAPE HALF SHEET 40X57 (DRAPES) IMPLANT
DRAPE INCISE IOBAN 66X45 STRL (DRAPES) ×1 IMPLANT
DRAPE STERI IOBAN 125X83 (DRAPES) ×1 IMPLANT
DRSG ADAPTIC 3X8 NADH LF (GAUZE/BANDAGES/DRESSINGS) ×1 IMPLANT
DURAPREP 26ML APPLICATOR (WOUND CARE) ×1 IMPLANT
ELECT CAUTERY BLADE 6.4 (BLADE) ×1 IMPLANT
ELECT REM PT RETURN 9FT ADLT (ELECTROSURGICAL) ×1
ELECTRODE REM PT RTRN 9FT ADLT (ELECTROSURGICAL) ×1 IMPLANT
GAUZE SPONGE 4X4 12PLY STRL (GAUZE/BANDAGES/DRESSINGS) IMPLANT
GAUZE SPONGE 4X4 12PLY STRL LF (GAUZE/BANDAGES/DRESSINGS) ×1 IMPLANT
GLOVE BIO SURGEON STRL SZ7.5 (GLOVE) ×2 IMPLANT
GLOVE BIOGEL PI IND STRL 8 (GLOVE) ×2 IMPLANT
GOWN STRL REUS W/ TWL LRG LVL3 (GOWN DISPOSABLE) ×1 IMPLANT
GOWN STRL REUS W/ TWL XL LVL3 (GOWN DISPOSABLE) ×2 IMPLANT
GOWN STRL REUS W/TWL LRG LVL3 (GOWN DISPOSABLE) ×1
GOWN STRL REUS W/TWL XL LVL3 (GOWN DISPOSABLE) ×2
KIT BASIN OR (CUSTOM PROCEDURE TRAY) ×1 IMPLANT
KIT TURNOVER KIT B (KITS) ×1 IMPLANT
NAIL IM FEM TROCH 9X20 125D (Nail) IMPLANT
NS IRRIG 1000ML POUR BTL (IV SOLUTION) ×1 IMPLANT
PACK GENERAL/GYN (CUSTOM PROCEDURE TRAY) ×1 IMPLANT
PAD ARMBOARD 7.5X6 YLW CONV (MISCELLANEOUS) ×2 IMPLANT
PIN GUIDE THRD AR 3.2X330 (PIN) IMPLANT
SCREW CORTICAL 5.0X32 (Screw) IMPLANT
SCREW LOCK LAG FEM 10.5X90 (Screw) IMPLANT
STAPLER VISISTAT 35W (STAPLE) ×1 IMPLANT
SUT MON AB 2-0 CT1 36 (SUTURE) ×1 IMPLANT
SUT VIC AB 2-0 CT1 27 (SUTURE) ×2
SUT VIC AB 2-0 CT1 TAPERPNT 27 (SUTURE) IMPLANT
TOOL ACTIVATION (INSTRUMENTS) IMPLANT
TOWEL GREEN STERILE (TOWEL DISPOSABLE) ×1 IMPLANT
TOWEL GREEN STERILE FF (TOWEL DISPOSABLE) ×1 IMPLANT
WATER STERILE IRR 1000ML POUR (IV SOLUTION) ×1 IMPLANT

## 2022-12-29 NOTE — Anesthesia Preprocedure Evaluation (Addendum)
Anesthesia Evaluation  Patient identified by MRN, date of birth, ID band Patient awake    Reviewed: Allergy & Precautions, H&P , NPO status , Patient's Chart, lab work & pertinent test results  Airway Mallampati: II  TM Distance: <3 FB Neck ROM: Full    Dental no notable dental hx. (+) Chipped   Pulmonary neg pulmonary ROS   Pulmonary exam normal breath sounds clear to auscultation       Cardiovascular hypertension (amlodipine, furosemide, metoprolol, spironolactone, telmisartan-HCTZ), Pt. on medications Normal cardiovascular exam Rhythm:Regular Rate:Normal  Myocardial Perfusion Imaging 01/13/2020:  The left ventricular ejection fraction is hyperdynamic (>65%).  Nuclear stress EF: 72%.  Baseline EKG showed ectopic atrial rhythm. No ST changes during infusion.  Markedly hypertensive at baseline and with infusion as high as 230/79mmHg.  The study is normal.  This is a low risk study.  TTE 11/10/2019: IMPRESSIONS     1. Left ventricular ejection fraction, by estimation, is 60 to 65%. The  left ventricle has normal function. The left ventricle has no regional  wall motion abnormalities. There is mild left ventricular hypertrophy.  Left ventricular diastolic parameters  are consistent with Grade I diastolic dysfunction (impaired relaxation).   2. Right ventricular systolic function is normal. The right ventricular  size is normal. Tricuspid regurgitation signal is inadequate for assessing  PA pressure.   3. The mitral valve is normal in structure. Trivial mitral valve  regurgitation. No evidence of mitral stenosis.   4. The aortic valve is abnormal. Aortic valve regurgitation is trivial.  Mild to moderate aortic valve sclerosis/calcification is present, without  any evidence of aortic stenosis.   5. The inferior vena cava is normal in size with greater than 50%  respiratory variability, suggesting right atrial pressure of 3  mmHg.      Neuro/Psych  Neuromuscular disease (lumbar DDD) negative neurological ROS  negative psych ROS   GI/Hepatic negative GI ROS,,,(+) Cirrhosis  (secondary to NASH)        Endo/Other  diabetes, Type 2    Renal/GU negative Renal ROS  negative genitourinary   Musculoskeletal negative musculoskeletal ROS (+) Arthritis ,    Abdominal   Peds negative pediatric ROS (+)  Hematology  (+) Blood dyscrasia (ITP (plt 79)), anemia   Anesthesia Other Findings   Reproductive/Obstetrics negative OB ROS                             Anesthesia Physical Anesthesia Plan  ASA: 3  Anesthesia Plan: General   Post-op Pain Management: Minimal or no pain anticipated   Induction: Intravenous  PONV Risk Score and Plan: 3 and Ondansetron, Dexamethasone and Treatment may vary due to age or medical condition  Airway Management Planned: Oral ETT  Additional Equipment:   Intra-op Plan:   Post-operative Plan: Extubation in OR  Informed Consent:      Dental advisory given  Plan Discussed with: CRNA and Surgeon  Anesthesia Plan Comments: (Risks of general anesthesia discussed including, but not limited to, sore throat, hoarse voice, chipped/damaged teeth, injury to vocal cords, nausea and vomiting, allergic reactions, lung infection, heart attack, stroke, and death. All questions answered. )       Anesthesia Quick Evaluation

## 2022-12-29 NOTE — Transfer of Care (Signed)
Immediate Anesthesia Transfer of Care Note  Patient: Denise Klein  Procedure(s) Performed: INTRAMEDULLARY (IM) NAILING OF RIGHT FEMUR (Right: Leg Upper)  Patient Location: PACU  Anesthesia Type:General  Level of Consciousness: drowsy  Airway & Oxygen Therapy: Patient Spontanous Breathing and Patient connected to nasal cannula oxygen  Post-op Assessment: Report given to RN  Post vital signs: Reviewed and stable  Last Vitals:  Vitals Value Taken Time  BP 167/58 12/29/22 1721  Temp    Pulse 73 12/29/22 1724  Resp 18 12/29/22 1724  SpO2 97 % 12/29/22 1724  Vitals shown include unvalidated device data.  Last Pain:  Vitals:   12/29/22 1600  TempSrc: Oral  PainSc:       Patients Stated Pain Goal: 3 (12/29/22 1530)  Complications: No notable events documented.

## 2022-12-29 NOTE — Consult Note (Addendum)
Reason for Consult:Right hip fx Referring Physician: Gerhard Munch Time called: 1151 Time at bedside: 1310   Denise Klein is an 72 y.o. female.  HPI: Denise Klein was feeding the goats this morning and slipped on some leaves and fell. She had immediate right hip pain and could not get up. She was brought to the ED where x-rays showed a right hip fx and orthopedic surgery was consulted. She lives at home with her husband.  Past Medical History:  Diagnosis Date   Broken ankle 1982   right   Broken wrist 1999   left (external fixator rod)   Carpal tunnel syndrome, right 2000   Degenerative disc disease at L5-S1 level    Diabetes (HCC)    Endometriosis 1978   HTN (hypertension)    Thrombocytopenia (HCC)     Past Surgical History:  Procedure Laterality Date   ABDOMINAL HYSTERECTOMY  1999   CARPAL TUNNEL RELEASE Right    TUBAL LIGATION  1986   WRIST SURGERY      Family History  Problem Relation Age of Onset   Heart disease Father    Heart disease Maternal Grandfather    Diabetes Mother    CAD Sister     Social History:  reports that she has never smoked. She has never used smokeless tobacco. No history on file for alcohol use and drug use.  Allergies:  Allergies  Allergen Reactions   Sulfamethoxazole-Trimethoprim Other (See Comments) and Rash   Acetaminophen Other (See Comments)    Liver affected   Ibuprofen Other (See Comments)    Liver affected   Meloxicam Other (See Comments)   Molds & Smuts    Naproxen Sodium Other (See Comments)    Liver affected   Pentazocine Other (See Comments)    hallucinations   Propoxyphene Swelling   Statins Other (See Comments)   Lisinopril Other (See Comments) and Nausea And Vomiting   Penicillin G Rash   Sulfamethoxazole Rash    Medications: I have reviewed the patient's current medications.  Results for orders placed or performed during the hospital encounter of 12/29/22 (from the past 48 hour(s))  Type and screen [facility]     Status: None   Collection Time: 12/29/22 10:43 AM  Result Value Ref Range   ABO/RH(D) O POS    Antibody Screen NEG    Sample Expiration      01/01/2023,2359 Performed at [facility], [address], [place], [place] [address]   Basic metabolic panel     Status: Abnormal   Collection Time: 12/29/22 10:47 AM  Result Value Ref Range   Sodium 136 135 - 145 mmol/L   Potassium 4.5 3.5 - 5.1 mmol/L   Chloride 109 98 - 111 mmol/L   CO2 17 (L) 22 - 32 mmol/L   Glucose, Bld 214 (H) 70 - 99 mg/dL    Comment: Glucose reference range applies only to samples taken after fasting for at least 8 hours.   BUN 17 8 - 23 mg/dL   Creatinine, Ser 6.04 0.44 - 1.00 mg/dL   Calcium 8.6 (L) 8.9 - 10.3 mg/dL   GFR, Estimated >54 >09 mL/min    Comment: (NOTE) Calculated using the CKD-EPI Creatinine Equation (2021)    Anion gap 10 5 - 15    Comment: Performed at [facility], [address], [place], [place] [address]  CBC with Differential     Status: Abnormal   Collection Time: 12/29/22 10:47 AM  Result Value Ref Range   WBC 5.5 4.0 - 10.5 K/uL   RBC 3.65 (L) 3.87 - 5.11 MIL/uL   Hemoglobin 11.3 (L) 12.0 - 15.0 g/dL   HCT 78.2 (L) 95.6 - 21.3 %   MCV 92.9 80.0 - 100.0 fL   MCH 31.0 26.0 - 34.0 pg   MCHC 33.3 30.0 - 36.0 g/dL   RDW 08.6 57.8 - 46.9 %   Platelets 79 (L) 150 - 400 K/uL    Comment: Immature Platelet Fraction may be clinically indicated, consider ordering this additional test [id number] REPEATED TO VERIFY PLATELET COUNT CONFIRMED BY SMEAR    nRBC 0.0 0.0 - 0.2 %   Neutrophils Relative % 60 %   Neutro Abs 3.3 1.7 - 7.7 K/uL   Lymphocytes Relative 25 %   Lymphs Abs 1.4 0.7 - 4.0 K/uL   Monocytes Relative 8 %   Monocytes Absolute 0.4 0.1 - 1.0 K/uL   Eosinophils Relative 5 %   Eosinophils Absolute 0.3 0.0 - 0.5 K/uL   Basophils Relative 1 %   Basophils Absolute 0.0 0.0 - 0.1 K/uL   Immature Granulocytes 1 %   Abs Immature Granulocytes 0.03 0.00  - 0.07 K/uL    Comment: Performed at [facility], [address], [place], [place] [address]  Protime-INR     Status: Abnormal   Collection Time: 12/29/22 10:47 AM  Result Value Ref Range   Prothrombin Time 17.5 (H) 11.4 - 15.2 seconds   INR 1.4 (H) 0.8 - 1.2    Comment: (NOTE) INR goal varies based on device and disease states. Performed at [facility], [address], [place], [place] [address]     DG Hip Unilat  With Pelvis 2-3 Views Right  Result Date: 12/29/2022 CLINICAL DATA:  Fall. EXAM: DG HIP (WITH OR WITHOUT PELVIS) 2-3V RIGHT COMPARISON:  None Available. FINDINGS: Moderately displaced and comminuted fracture is seen involving intertrochanteric portion of right proximal femur. IMPRESSION: Moderately displaced and comminuted intertrochanteric fracture of proximal right femur. Electronically Signed   By: Lupita Raider M.D.   On: 12/29/2022 11:34   DG Chest Port 1 View  Result Date: 12/29/2022 CLINICAL DATA:  Fall. EXAM: PORTABLE CHEST 1 VIEW COMPARISON:  May 07, 2022. FINDINGS: Stable cardiomediastinal silhouette. Both lungs are clear. The visualized skeletal structures are unremarkable. IMPRESSION: No active disease. Electronically Signed   By: Lupita Raider M.D.   On: 12/29/2022 11:32    Review of Systems  HENT:  Negative for ear discharge, ear pain, hearing loss and tinnitus.   Eyes:  Negative for photophobia and pain.  Respiratory:  Negative for cough and shortness of breath.   Cardiovascular:  Negative for chest pain.  Gastrointestinal:  Negative for abdominal pain, nausea and vomiting.  Genitourinary:  Negative for dysuria, flank pain, frequency and urgency.  Musculoskeletal:  Positive for arthralgias (Right hip). Negative for back pain, myalgias and neck pain.  Neurological:  Negative for dizziness and headaches.  Hematological:  Does not bruise/bleed easily.  Psychiatric/Behavioral:  The patient is not nervous/anxious.    Blood pressure (!)  179/52, pulse 70, temperature 98.1 F (36.7 C), temperature source Oral, resp. rate 17, height 5\' 4"  (1.626 m), weight 82.1 kg, SpO2 98 %. Physical Exam Constitutional:      General: She is not in acute distress.    Appearance: She is well-developed. She is not diaphoretic.  HENT:     Head: Normocephalic and atraumatic.  Eyes:  General: No scleral icterus.       Right eye: No discharge.        Left eye: No discharge.     Conjunctiva/sclera: Conjunctivae normal.  Cardiovascular:     Rate and Rhythm: Normal rate and regular rhythm.  Pulmonary:     Effort: Pulmonary effort is normal. No respiratory distress.  Musculoskeletal:     Cervical back: Normal range of motion.     Comments: RLE No traumatic wounds, ecchymosis, or rash  Severe TTP hip  No knee or ankle effusion  Knee stable to varus/ valgus and anterior/posterior stress  Sens DPN, SPN, TN intact  Motor EHL, ext, flex, evers 5/5  DP 2+, PT 2+, No significant edema  Skin:    General: Skin is warm and dry.  Neurological:     Mental Status: She is alert.  Psychiatric:        Mood and Affect: Mood normal.        Behavior: Behavior normal.     Assessment/Plan: Right hip fx -- Plan IMN today with Dr. Aundria Rud. Please keep NPO. Multiple medical problems including DM and thrombocytopenia -- Will give unit of plts before surgery.    Freeman Caldron, PA-C Orthopedic Surgery [phone] 12/29/2022, 1:31 PM    I have seen and examined patient and I agree with this plan as outlined above.  Plan for OR today for right hip IMN.

## 2022-12-29 NOTE — ED Notes (Signed)
ED Provider at bedside. 

## 2022-12-29 NOTE — Op Note (Signed)
Date of Surgery: 12/29/2022  INDICATIONS: Ms. Mensinger is a 72 y.o.-year-old female who sustained a right hip intertrochanteric fracture. The risks and benefits of the procedure discussed with the patient and family prior to the procedure and all questions were answered; consent was obtained.  PREOPERATIVE DIAGNOSIS: right hip fracture   POSTOPERATIVE DIAGNOSIS: Same   PROCEDURE: Treatment of intertrochanteric, pertrochanteric, subtrochanteric fracture with intramedullary implant. CPT [phone]   SURGEON: Kathi Der. Aundria Rud, M.D.   ANESTHESIA: general   IV FLUIDS AND URINE: See anesthesia record   ESTIMATED BLOOD LOSS: 50 cc  IMPLANTS: Arthrex cephalomedullary nail 9 mm x 200 mm. 90 mm proximal lag screw with a 32 x 5.0 mm distal interlock  DRAINS: None.   COMPLICATIONS: None.   DESCRIPTION OF PROCEDURE: The patient was brought to the operating room and placed supine on the operating table. The patient's leg had been signed prior to the procedure. The patient had the anesthesia placed by the anesthesiologist. The prep verification and incision time-outs were performed to confirm that this was the correct patient, site, side and location. The patient had an SCD on the opposite lower extremity. The patient did receive antibiotics prior to the incision and was re-dosed during the procedure as needed at indicated intervals. The patient was positioned on the fracture table with the table in traction and internal rotation to reduce the hip. The well leg was placed in a scissor position and all bony prominences were well-padded. The patient had the lower extremity prepped and draped in the standard surgical fashion. The incision was made 4 finger breadths superior to the greater trochanter. A guide pin was inserted into the tip of the greater trochanter under fluoroscopic guidance. An opening reamer was used to gain access to the femoral canal. The nail length was measured and inserted down the  femoral canal to its proper depth. The appropriate version of insertion for the lag screw was found under fluoroscopy. A pin was inserted up the femoral neck through the jig. The length of the lag screw was then measured. The lag screw was inserted as near to center-center in the head as possible. The leg was taken out of traction, then the compression screw was used to compress across the fracture. Compression was visualized on serial xrays.   We next turned our attention to the distal interlocking screw.  This was placed through the drill guide of the nail inserter.  A small incision was made overlying the lateral thigh at the screw site, and a tonsil was used to disect down to bone.  A drill pass was made through the jig and across the nail through both cortices.  This was measured, and the appropriate screw was placed under hand power and found to have good bite.    The wound was copiously irrigated with saline and the subcutaneous layer closed with 2.0 vicryl and the skin was reapproximated with staples. The wounds were cleaned and dried a final time and a sterile dressing was placed. The hip was taken through a range of motion at the end of the case under fluoroscopic imaging to visualize the approach-withdraw phenomenon and confirm implant length in the head. The patient was then awakened from anesthesia and taken to the recovery room in stable condition. All counts were correct at the end of the case.   POSTOPERATIVE PLAN: The patient will be weight bearing as tolerated and will return in 2 weeks for staple removal and the patient will receive DVT prophylaxis  based on other medications, activity level, and risk ratio of bleeding to thrombosis.  Assuming she maintains her stable thrombocytopenia from preoperative status, will discharge home on 81 mg aspirin once per day x 6 weeks.   Maryan Rued, MD [facility] [phone] 5:06 PM

## 2022-12-29 NOTE — Anesthesia Procedure Notes (Signed)
Procedure Name: Intubation Date/Time: 12/29/2022 4:16 PM  Performed by: De Nurse, CRNAPre-anesthesia Checklist: Patient identified, Emergency Drugs available, Suction available and Patient being monitored Patient Re-evaluated:Patient Re-evaluated prior to induction Oxygen Delivery Method: Circle System Utilized Preoxygenation: Pre-oxygenation with 100% oxygen Induction Type: IV induction Ventilation: Mask ventilation without difficulty Laryngoscope Size: Mac and 3 Grade View: Grade I Tube type: Oral Tube size: 7.0 mm Number of attempts: 1 Airway Equipment and Method: Stylet and Oral airway Placement Confirmation: ETT inserted through vocal cords under direct vision, positive ETCO2 and breath sounds checked- equal and bilateral Secured at: 21 cm Tube secured with: Tape Dental Injury: Teeth and Oropharynx as per pre-operative assessment

## 2022-12-29 NOTE — Discharge Instructions (Signed)
Orthopedic surgery discharge instructions:  -Okay for immediate full weightbearing as tolerated to the right lower extremity.  -Maintain postoperative bandage until your follow-up appointment.  You may shower starting on postoperative day #2 with this bandage in place.  Please do not submerge underwater.  -You should apply ice liberally to the right hip throughout the day for 20 to 30 minutes at a time.  This is often as possible to help with swelling and pain.  -For mild to moderate pain use Tylenol and Advil as necessary.  For any breakthrough pain use oxycodone as necessary.  -He will follow-up with Dr. Aundria Rud in the office in 2 weeks for routine postoperative check and x-rays.

## 2022-12-29 NOTE — Plan of Care (Signed)

## 2022-12-29 NOTE — H&P (Addendum)
History and Physical    Patient: Denise Klein ZOX:[id number] DOB: 03-19-1951 DOA: 12/29/2022 DOS: the patient was seen and examined on 12/29/2022 PCP: Denise Raider, MD  Patient coming from: Home via ema  Chief Complaint:  Chief Complaint  Patient presents with   Hip Pain   Fall   HPI: Denise Klein is a 72 y.o. female with medical history significant of hypertension, diabetes mellitus type 2, thrombocytopenia, cirrhosis, and obesity who presents after having a fall with complaints of right hip pain.  Patient had been outside feeding the goats when she slipped and fell and landed on her right side.  She reported having severe pain in the right hip and was unable to ambulate.  Normally patient is able to ambulate and perform all of her activities of daily living without need of assistance.  Patient's daughter confirms that she has been taking 170 units of [place] daily, but blood sugars have been in the 40s in the mornings here recently.  Fentanyl 100 mcg IV prior to arrival.  In the emergency department patient was noted to be afebrile with blood pressures elevated up to 179/52, and all other vital signs maintained.  Labs significant for hemoglobin 11.3, platelets 79, CO2 17, glucose 214, and calcium 8.6.  Chest x-ray noted no acute abnormality.  X-rays of the right hip noted a moderately displaced and communicated intertrochanteric fracture of the proximal right femur.  Orthopedics have been consulted keep patient n.p.o. for possible surgical correction tonight.  Review of Systems: As mentioned in the history of present illness. All other systems reviewed and are negative. Past Medical History:  Diagnosis Date   Broken ankle 1982   right   Broken wrist 1999   left (external fixator rod)   Carpal tunnel syndrome, right 2000   Degenerative disc disease at L5-S1 level    Diabetes (HCC)    Endometriosis 1978   HTN (hypertension)    Thrombocytopenia (HCC)    Past Surgical  History:  Procedure Laterality Date   ABDOMINAL HYSTERECTOMY  1999   CARPAL TUNNEL RELEASE Right    TUBAL LIGATION  1986   WRIST SURGERY     Social History:  reports that she has never smoked. She has never used smokeless tobacco. No history on file for alcohol use and drug use.  Allergies  Allergen Reactions   Sulfamethoxazole-Trimethoprim Other (See Comments) and Rash   Acetaminophen Other (See Comments)    Liver affected   Ibuprofen Other (See Comments)    Liver affected   Meloxicam Other (See Comments)   Molds & Smuts    Naproxen Sodium Other (See Comments)    Liver affected   Pentazocine Other (See Comments)    hallucinations   Propoxyphene Swelling   Statins Other (See Comments)   Lisinopril Other (See Comments) and Nausea And Vomiting   Penicillin G Rash   Sulfamethoxazole Rash    Family History  Problem Relation Age of Onset   Heart disease Father    Heart disease Maternal Grandfather    Diabetes Mother    CAD Sister     Prior to Admission medications   Medication Sig Start Date End Date Taking? Authorizing Provider  ACCU-CHEK AVIVA PLUS test strip  04/04/20   [provider]  amLODipine (NORVASC) 2.5 MG tablet Take 2.5 mg by mouth daily.    [provider]  cetirizine (ZYRTEC) 10 MG tablet Take 10 mg by mouth daily. 06/14/20   [provider]  colesevelam [facility]) 625  MG tablet Take 3 tablets by mouth 2 (two) times daily. 11/06/15   [provider]  insulin degludec (TRESIBA FLEXTOUCH) 100 UNIT/ML FlexTouch Pen Inject 167 Units into the skin daily. 05/16/17   [provider]  metFORMIN (GLUCOPHAGE-XR) 500 MG 24 hr tablet Take 2 tablets by mouth in the morning and at bedtime. 11/06/15   [provider]  metoprolol succinate (TOPROL-XL) 100 MG 24 hr tablet Take 1 tablet by mouth in the morning and at bedtime.  12/08/15   [provider]  telmisartan-hydrochlorothiazide (MICARDIS HCT) 80-12.5 MG tablet Take  2 tablets by mouth daily. 05/24/17   [provider]    Physical Exam: Vitals:   12/29/22 1031 12/29/22 1032 12/29/22 1034 12/29/22 1200  BP:   (!) 174/50 (!) 179/52  Pulse:   69 70  Resp:   16 17  Temp:   98.1 F (36.7 C)   TempSrc:   Oral   SpO2: 96%  97% 98%  Weight:  82.1 kg    Height:  5\' 4"  (1.626 m)     Constitutional: Elderly female currently peers to be in no acute distress Eyes: PERRL, lids and conjunctivae normal ENMT: Mucous membranes are moist.  Hard of hearing Neck: normal, supple  Respiratory: clear to auscultation bilaterally, no wheezing, no crackles. Normal respiratory effort.   Cardiovascular: Regular rate and rhythm, no murmurs / rubs / gallops. No extremity edema. 2+ pedal pulses.   Abdomen: no tenderness, no masses palpated.  Mild tenderness to palpation noted of the left lower quadrant of the abdomen.   Musculoskeletal: no clubbing / cyanosis.  Left leg externally rotated and mildly shortened. Skin: no rashes, lesions, ulcers. No induration Neurologic: CN 2-12 grossly intact.  Able to move all extremities. Psychiatric: Normal judgment and insight. Alert and oriented x 3. Normal mood.   Data Reviewed:   EKG revealed ectopic atrial rhythm at 70 bpm.  Reviewed labs, imaging, and pertinent records as noted above in HPI  Assessment and Plan: Intertrochanteric fracture of the right femur secondary to fall Acute.  Patient reported slipping and falling today while feeding her goats.  Found to have moderately displaced and communicated intertrochanteric fracture of the proximal right femur on x-ray.  Orthopedics consulted and plan on taking patient to the operating room later on today. -Admit to a medical telemetry bed -Hip fracture order set utilized -N.p.o. for possible need of procedure this afternoon -Oxycodone/morphine as needed for moderate to severe pain respectively. -Consult anesthesia for right hip nerve block -Consult transitions of care for  likely need of rehab surgery -Appreciate orthopedic consultative services,   will follow-up for any further recommendations  Normocytic anemia Chronic.  Hemoglobin 11.3 which appears near her baseline. -Continue to monitor  Diabetes mellitus type II, with long-term use of insulin On admission glucose elevated at 214.  Patient had been on metformin and reportedly taking Tresiba 170 units daily, but had recently reported lows in the 40s in AM. -Hypoglycemic protocols -Hold metformin -Pharmacy substitution of Semglee reduced to 40 units daily -CBGs before every meal with moderate SSI -Adjust insulin regimen as needed  Essential hypertension -Resume home blood pressure medication regimen  Cirrhosis secondary to NASH Patient without known history of alcohol or tobacco abuse.  Prior ultrasounds noting concern for cirrhosis.  Thrombocytopenia Acute on chronic.  Platelet count 79.  No reports of bleeding.  Patient is followed by oncology in the outpatient setting.  Symptoms thought most likely secondary to -Transfuse platelets as needed prior  to surgery  Obesity BMI 31.07 kg/m  DVT prophylaxis: TED hose Advance Care Planning:   Code Status: Full Code   Consults: Orthopedics  Family Communication: Family updated at bedside  Severity of Illness: The appropriate patient status for this patient is INPATIENT. Inpatient status is judged to be reasonable and necessary in order to provide the required intensity of service to ensure the patient's safety. The patient's presenting symptoms, physical exam findings, and initial radiographic and laboratory data in the context of their chronic comorbidities is felt to place them at high risk for further clinical deterioration. Furthermore, it is not anticipated that the patient will be medically stable for discharge from the hospital within 2 midnights of admission.   * I certify that at the point of admission it is my clinical judgment that the  patient will require inpatient hospital care spanning beyond 2 midnights from the point of admission due to high intensity of service, high risk for further deterioration and high frequency of surveillance required.*  Author: Clydie Braun, MD 12/29/2022 12:41 PM  For on call review [email or web address].

## 2022-12-29 NOTE — Brief Op Note (Signed)
12/29/2022  5:04 PM  PATIENT:  Denise Klein  72 y.o. female  PRE-OPERATIVE DIAGNOSIS:  Right Hip Fx  POST-OPERATIVE DIAGNOSIS:  Right Hip Fx  PROCEDURE:  Procedure(s): INTRAMEDULLARY (IM) NAILING OF RIGHT FEMUR (Right)  SURGEON:  Surgeon(s) and Role:    * Yolonda Kida, MD - Primary  PHYSICIAN ASSISTANT: Dion Saucier, PA-C   ANESTHESIA:   local and general  EBL:  50 mL   BLOOD ADMINISTERED: 1 unit preoperatively of PLTS  DRAINS: none   LOCAL MEDICATIONS USED:  MARCAINE     SPECIMEN:  No Specimen  DISPOSITION OF SPECIMEN:  N/A  COUNTS:  YES  TOURNIQUET:  * No tourniquets in log *  DICTATION: .Note written in EPIC  PLAN OF CARE: Admit to inpatient   PATIENT DISPOSITION:  PACU - hemodynamically stable.   Delay start of Pharmacological VTE agent (>24hrs) due to surgical blood loss or risk of bleeding: not applicable

## 2022-12-29 NOTE — ED Provider Notes (Signed)
[place] EMERGENCY DEPARTMENT AT [facility] Provider Note   CSN: [id number] Arrival date & time: 12/29/22  1030     History  Chief Complaint  Patient presents with   Hip Pain   Fall    Denise Klein is a 72 y.o. female.  HPI Patient presents via EMS after a fall.  Patient received fentanyl, 100 mcg en route, notes that she is feeling loopy. Per EMS the patient was in a field, fell, and since that time has had pain in the right hip with external rotation and shortening. The patient denies pain anywhere other than her hip, denies loss of sensation distally. She denies head trauma or head or neck pain. Patient states that she is generally well, was so prior to the event. EMS reports no notable changes en route, aside from severe pain requiring fentanyl as above.     Home Medications Prior to Admission medications   Medication Sig Start Date End Date Taking? Authorizing Provider  ACCU-CHEK AVIVA PLUS test strip  04/04/20   [provider]  amLODipine (NORVASC) 2.5 MG tablet Take 2.5 mg by mouth daily.    [provider]  cetirizine (ZYRTEC) 10 MG tablet Take 10 mg by mouth daily. 06/14/20   [provider]  colesevelam (WELCHOL) 625 MG tablet Take 3 tablets by mouth 2 (two) times daily. 11/06/15   [provider]  insulin degludec (TRESIBA FLEXTOUCH) 100 UNIT/ML FlexTouch Pen Inject 167 Units into the skin daily. 05/16/17   [provider]  metFORMIN (GLUCOPHAGE-XR) 500 MG 24 hr tablet Take 2 tablets by mouth in the morning and at bedtime. 11/06/15   [provider]  metoprolol succinate (TOPROL-XL) 100 MG 24 hr tablet Take 1 tablet by mouth in the morning and at bedtime.  12/08/15   [provider]  telmisartan-hydrochlorothiazide (MICARDIS HCT) 80-12.5 MG tablet Take 2 tablets by mouth daily. 05/24/17   [provider]      Allergies    Sulfamethoxazole-trimethoprim, Acetaminophen, Ibuprofen,  Meloxicam, Molds & smuts, Naproxen sodium, Pentazocine, Propoxyphene, Statins, Lisinopril, Penicillin g, and Sulfamethoxazole    Review of Systems   Review of Systems  All other systems reviewed and are negative.   Physical Exam Updated Vital Signs BP (!) 174/50 (BP Location: Right Arm)   Pulse 69   Temp 98.1 F (36.7 C) (Oral)   Resp 16   Ht 5\' 4"  (1.626 m)   Wt 82.1 kg   SpO2 97%   BMI 31.07 kg/m  Physical Exam Vitals and nursing note reviewed.  Constitutional:      General: She is not in acute distress.    Appearance: She is well-developed.  HENT:     Head: Normocephalic and atraumatic.  Eyes:     Conjunctiva/sclera: Conjunctivae normal.  Cardiovascular:     Rate and Rhythm: Normal rate and regular rhythm.  Pulmonary:     Effort: Pulmonary effort is normal. No respiratory distress.     Breath sounds: Normal breath sounds. No stridor.  Abdominal:     General: There is no distension.  Musculoskeletal:       Legs:  Skin:    General: Skin is warm and dry.  Neurological:     Mental Status: She is alert and oriented to person, place, and time.     Cranial Nerves: No cranial nerve deficit.     Comments: Patient has preserved motion and sensation in the distal right lower extremity.  Left lower extremity unremarkable, remaining  neuroexam unremarkable.  Psychiatric:        Mood and Affect: Mood normal.     ED Results / Procedures / Treatments   Labs (all labs ordered are listed, but only abnormal results are displayed) Labs Reviewed  PROTIME-INR - Abnormal; Notable for the following components:      Result Value   Prothrombin Time 17.5 (*)    INR 1.4 (*)    All other components within normal limits  BASIC METABOLIC PANEL  CBC WITH DIFFERENTIAL/PLATELET  TYPE AND SCREEN  ABO/RH    EKG EKG Interpretation  Date/Time:  Monday Dec 29 2022 10:35:14 EDT Ventricular Rate:  70 PR Interval:  178 QRS Duration: 103 QT Interval:  444 QTC Calculation: 480 R  Axis:   -19 Text Interpretation: Ectopic atrial rhythm Borderline left axis deviation Confirmed by Gerhard Munch [phone]) on 12/29/2022 10:48:10 AM  Radiology DG Hip Unilat  With Pelvis 2-3 Views Right  Result Date: 12/29/2022 CLINICAL DATA:  Fall. EXAM: DG HIP (WITH OR WITHOUT PELVIS) 2-3V RIGHT COMPARISON:  None Available. FINDINGS: Moderately displaced and comminuted fracture is seen involving intertrochanteric portion of right proximal femur. IMPRESSION: Moderately displaced and comminuted intertrochanteric fracture of proximal right femur. Electronically Signed   By: Lupita Raider M.D.   On: 12/29/2022 11:34   DG Chest Port 1 View  Result Date: 12/29/2022 CLINICAL DATA:  Fall. EXAM: PORTABLE CHEST 1 VIEW COMPARISON:  May 07, 2022. FINDINGS: Stable cardiomediastinal silhouette. Both lungs are clear. The visualized skeletal structures are unremarkable. IMPRESSION: No active disease. Electronically Signed   By: Lupita Raider M.D.   On: 12/29/2022 11:32    Procedures Procedures    Medications Ordered in ED Medications  fentaNYL (SUBLIMAZE) injection 50 mcg (50 mcg Intravenous Given 12/29/22 1154)    ED Course/ Medical Decision Making/ A&P                             Medical Decision Making Previously well adult female presents after mechanical fall with pain in the right hip.  Concern for musculoskeletal injury including fracture versus dislocation.  Patient received fentanyl and required additional meds here, had x-ray, labs ordered. Cardiac 70 sinus normal Pulse ox 100% room air normal   Amount and/or Complexity of Data Reviewed Independent Historian: EMS Labs: ordered. Decision-making details documented in ED Course. Radiology: ordered and independent interpretation performed. Decision-making details documented in ED Course. ECG/medicine tests: ordered and independent interpretation performed. Decision-making details documented in ED Course.  Risk Prescription drug  management. Decision regarding hospitalization.   12:00 PM Patient accompanied by family. X-ray reviewed, patient with right intratrochanteric fracture.  Labs thus far unremarkable, EKG unremarkable. This adult female presents after mechanical fall was found to have right hip fracture.  She is otherwise unremarkable medically, is awake, alert, hemodynamically stable, case discussed with orthopedics, internal medicine, patient admitted for surgical repair.        Final Clinical Impression(s) / ED Diagnoses Final diagnoses:  Fall, initial encounter  Closed fracture of right hip, initial encounter [facility])     Gerhard Munch, MD 12/29/22 1228

## 2022-12-29 NOTE — ED Notes (Signed)
ED TO INPATIENT HANDOFF REPORT  ED Nurse Name and Phone #: [phone]  S Name/Age/Gender Denise Klein 72 y.o. female Room/Bed: 019C/019C  Code Status   Code Status: Not on file  Home/SNF/Other Home Patient oriented to: self, place, time, and situation Is this baseline? Yes   Triage Complete: Triage complete  Chief Complaint Closed right hip fracture (HCC) [S72.001A]  Triage Note Per EMS pt coming from home- pt was outside lost her balance and fell. Right pelvis pain with right leg shortening. Given 100 mcg fentanyl en route.    Allergies Allergies  Allergen Reactions   Sulfamethoxazole-Trimethoprim Other (See Comments) and Rash   Acetaminophen Other (See Comments)    Liver affected   Ibuprofen Other (See Comments)    Liver affected   Meloxicam Other (See Comments)   Molds & Smuts    Naproxen Sodium Other (See Comments)    Liver affected   Pentazocine Other (See Comments)    hallucinations   Propoxyphene Swelling   Statins Other (See Comments)   Lisinopril Other (See Comments) and Nausea And Vomiting   Penicillin G Rash   Sulfamethoxazole Rash    Level of Care/Admitting Diagnosis ED Disposition     ED Disposition  Admit   Condition  --   Comment  Hospital Area: [facility] [[id number]]  Level of Care: Telemetry Medical [104]  May admit patient to [facility] or [facility] if equivalent level of care is available:: No  Covid Evaluation: Asymptomatic - no recent exposure (last 10 days) testing not required  Diagnosis: Closed right hip fracture [facility]) [454098]  Admitting Physician: Clydie Braun [[id number]]  Attending Physician: Clydie Braun [[id number]]  Certification:: I certify this patient will need inpatient services for at least 2 midnights  Estimated Length of Stay: 3          B Medical/Surgery History Past Medical History:  Diagnosis Date   Broken ankle 1982   right   Broken wrist 1999   left (external fixator rod)    Carpal tunnel syndrome, right 2000   Degenerative disc disease at L5-S1 level    Diabetes (HCC)    Endometriosis 1978   HTN (hypertension)    Thrombocytopenia (HCC)    Past Surgical History:  Procedure Laterality Date   ABDOMINAL HYSTERECTOMY  1999   CARPAL TUNNEL RELEASE Right    TUBAL LIGATION  1986   WRIST SURGERY       A IV Location/Drains/Wounds Patient Lines/Drains/Airways Status     Active Line/Drains/Airways     Name Placement date Placement time Site Days   Peripheral IV 12/29/22 20 G 1" Left;Posterior Hand 12/29/22  1037  Hand  less than 1            Intake/Output Last 24 hours No intake or output data in the 24 hours ending 12/29/22 1303  Labs/Imaging Results for orders placed or performed during the hospital encounter of 12/29/22 (from the past 48 hour(s))  Type and screen [facility]     Status: None   Collection Time: 12/29/22 10:43 AM  Result Value Ref Range   ABO/RH(D) O POS    Antibody Screen NEG    Sample Expiration      01/01/2023,2359 Performed at [facility], [address], [place], [place] [address]   Basic metabolic panel     Status: Abnormal   Collection Time: 12/29/22 10:47 AM  Result Value Ref Range   Sodium 136 135 - 145  mmol/L   Potassium 4.5 3.5 - 5.1 mmol/L   Chloride 109 98 - 111 mmol/L   CO2 17 (L) 22 - 32 mmol/L   Glucose, Bld 214 (H) 70 - 99 mg/dL    Comment: Glucose reference range applies only to samples taken after fasting for at least 8 hours.   BUN 17 8 - 23 mg/dL   Creatinine, Ser 1.61 0.44 - 1.00 mg/dL   Calcium 8.6 (L) 8.9 - 10.3 mg/dL   GFR, Estimated >09 >60 mL/min    Comment: (NOTE) Calculated using the CKD-EPI Creatinine Equation (2021)    Anion gap 10 5 - 15    Comment: Performed at [facility], [address], [place], [place] [address]  CBC with Differential     Status: Abnormal   Collection Time: 12/29/22 10:47 AM  Result Value Ref Range   WBC 5.5 4.0 - 10.5 K/uL   RBC  3.65 (L) 3.87 - 5.11 MIL/uL   Hemoglobin 11.3 (L) 12.0 - 15.0 g/dL   HCT 81.1 (L) 91.4 - 78.2 %   MCV 92.9 80.0 - 100.0 fL   MCH 31.0 26.0 - 34.0 pg   MCHC 33.3 30.0 - 36.0 g/dL   RDW 95.6 21.3 - 08.6 %   Platelets 79 (L) 150 - 400 K/uL    Comment: Immature Platelet Fraction may be clinically indicated, consider ordering this additional test [id number] REPEATED TO VERIFY PLATELET COUNT CONFIRMED BY SMEAR    nRBC 0.0 0.0 - 0.2 %   Neutrophils Relative % 60 %   Neutro Abs 3.3 1.7 - 7.7 K/uL   Lymphocytes Relative 25 %   Lymphs Abs 1.4 0.7 - 4.0 K/uL   Monocytes Relative 8 %   Monocytes Absolute 0.4 0.1 - 1.0 K/uL   Eosinophils Relative 5 %   Eosinophils Absolute 0.3 0.0 - 0.5 K/uL   Basophils Relative 1 %   Basophils Absolute 0.0 0.0 - 0.1 K/uL   Immature Granulocytes 1 %   Abs Immature Granulocytes 0.03 0.00 - 0.07 K/uL    Comment: Performed at [facility], [address], [place], [place] [address]  Protime-INR     Status: Abnormal   Collection Time: 12/29/22 10:47 AM  Result Value Ref Range   Prothrombin Time 17.5 (H) 11.4 - 15.2 seconds   INR 1.4 (H) 0.8 - 1.2    Comment: (NOTE) INR goal varies based on device and disease states. Performed at [facility], [address] [place], [place] [address]    DG Hip Unilat  With Pelvis 2-3 Views Right  Result Date: 12/29/2022 CLINICAL DATA:  Fall. EXAM: DG HIP (WITH OR WITHOUT PELVIS) 2-3V RIGHT COMPARISON:  None Available. FINDINGS: Moderately displaced and comminuted fracture is seen involving intertrochanteric portion of right proximal femur. IMPRESSION: Moderately displaced and comminuted intertrochanteric fracture of proximal right femur. Electronically Signed   By: Lupita Raider M.D.   On: 12/29/2022 11:34   DG Chest Port 1 View  Result Date: 12/29/2022 CLINICAL DATA:  Fall. EXAM: PORTABLE CHEST 1 VIEW COMPARISON:  May 07, 2022. FINDINGS: Stable cardiomediastinal silhouette. Both lungs are clear. The  visualized skeletal structures are unremarkable. IMPRESSION: No active disease. Electronically Signed   By: Lupita Raider M.D.   On: 12/29/2022 11:32    Pending Labs Unresulted Labs (From admission, onward)     Start     Ordered   12/29/22 1145  ABO/Rh  Once,   R  12/29/22 1145            Vitals/Pain Today's Vitals   12/29/22 1032 12/29/22 1034 12/29/22 1150 12/29/22 1200  BP:  (!) 174/50  (!) 179/52  Pulse:  69  70  Resp:  16  17  Temp:  98.1 F (36.7 C)    TempSrc:  Oral    SpO2:  97%  98%  Weight: 82.1 kg     Height: 5\' 4"  (1.626 m)     PainSc: Asleep  10-Worst pain ever     Isolation Precautions No active isolations  Medications Medications  fentaNYL (SUBLIMAZE) injection 50 mcg (50 mcg Intravenous Given 12/29/22 1154)    Mobility walks     Focused Assessments Cardiac Assessment Handoff:  Cardiac Rhythm: Normal sinus rhythm No results found for: "CKTOTAL", "CKMB", "CKMBINDEX", "TROPONINI" No results found for: "DDIMER" Does the Patient currently have chest pain? No    R Recommendations: See Admitting Provider Note  Report given to:   Additional Notes:

## 2022-12-29 NOTE — ED Triage Notes (Signed)
Per EMS pt coming from home- pt was outside lost her balance and fell. Right pelvis pain with right leg shortening. Given 100 mcg fentanyl en route.

## 2022-12-30 ENCOUNTER — Encounter (HOSPITAL_COMMUNITY): Payer: Self-pay | Admitting: Orthopedic Surgery

## 2022-12-30 DIAGNOSIS — S72001A Fracture of unspecified part of neck of right femur, initial encounter for closed fracture: Secondary | ICD-10-CM | POA: Diagnosis not present

## 2022-12-30 LAB — HEMOGLOBIN A1C
Hgb A1c MFr Bld: 6.5 % — ABNORMAL HIGH (ref 4.8–5.6)
Mean Plasma Glucose: 139.85 mg/dL

## 2022-12-30 LAB — CBC
HCT: 29.1 % — ABNORMAL LOW (ref 36.0–46.0)
Hemoglobin: 10.3 g/dL — ABNORMAL LOW (ref 12.0–15.0)
MCH: 31 pg (ref 26.0–34.0)
MCHC: 35.4 g/dL (ref 30.0–36.0)
MCV: 87.7 fL (ref 80.0–100.0)
Platelets: 105 10*3/uL — ABNORMAL LOW (ref 150–400)
RBC: 3.32 MIL/uL — ABNORMAL LOW (ref 3.87–5.11)
RDW: 14.5 % (ref 11.5–15.5)
WBC: 11.1 10*3/uL — ABNORMAL HIGH (ref 4.0–10.5)
nRBC: 0 % (ref 0.0–0.2)

## 2022-12-30 LAB — PREPARE PLATELET PHERESIS: Unit division: 0

## 2022-12-30 LAB — BASIC METABOLIC PANEL
Anion gap: 9 (ref 5–15)
BUN: 24 mg/dL — ABNORMAL HIGH (ref 8–23)
CO2: 20 mmol/L — ABNORMAL LOW (ref 22–32)
Calcium: 8.7 mg/dL — ABNORMAL LOW (ref 8.9–10.3)
Chloride: 103 mmol/L (ref 98–111)
Creatinine, Ser: 1.1 mg/dL — ABNORMAL HIGH (ref 0.44–1.00)
GFR, Estimated: 53 mL/min — ABNORMAL LOW (ref >60)
Glucose, Bld: 168 mg/dL — ABNORMAL HIGH (ref 70–99)
Potassium: 4.8 mmol/L (ref 3.5–5.1)
Sodium: 132 mmol/L — ABNORMAL LOW (ref 135–145)

## 2022-12-30 LAB — BPAM PLATELET PHERESIS
ISSUE DATE / TIME: 202405201540
Unit Type and Rh: 5100

## 2022-12-30 LAB — GLUCOSE, CAPILLARY
Glucose-Capillary: 170 mg/dL — ABNORMAL HIGH (ref 70–99)
Glucose-Capillary: 208 mg/dL — ABNORMAL HIGH (ref 70–99)
Glucose-Capillary: 242 mg/dL — ABNORMAL HIGH (ref 70–99)
Glucose-Capillary: 233 mg/dL — ABNORMAL HIGH (ref 70–99)

## 2022-12-30 MED ORDER — ASPIRIN 81 MG PO TBEC
81.0000 mg | DELAYED_RELEASE_TABLET | Freq: Two times a day (BID) | ORAL | Status: DC
  Administered 2022-12-30 – 2023-01-02 (×6): 81 mg via ORAL
  Filled 2022-12-30 (×6): qty 1

## 2022-12-30 MED ORDER — ENSURE ENLIVE PO LIQD
237.0000 mL | Freq: Two times a day (BID) | ORAL | Status: DC
  Administered 2022-12-31 – 2023-01-01 (×3): 237 mL via ORAL

## 2022-12-30 MED ORDER — ADULT MULTIVITAMIN W/MINERALS CH
1.0000 | ORAL_TABLET | Freq: Every day | ORAL | Status: DC
  Administered 2022-12-30 – 2023-01-02 (×4): 1 via ORAL
  Filled 2022-12-30 (×4): qty 1

## 2022-12-30 MED ORDER — OXYCODONE HCL 5 MG PO TABS: 5.0000 mg | ORAL_TABLET | Freq: Four times a day (QID) | ORAL | 0 refills | Status: DC | PRN

## 2022-12-30 NOTE — [place] FL2 (Signed)
[place] MEDICAID FL2 LEVEL OF CARE FORM     IDENTIFICATION  Patient Name: Denise Klein Birthdate: 06/25/1951 Sex: female Admission Date (Current Location): 12/29/2022  [facility] and [place] Number:  Producer, television/film/video and Address:  [facility], [address] [place], [place] [address]      Provider Number: [id number]  Attending Physician Name and Address:  Noralee Stain, DO  Relative Name and Phone Number:  Karem, Weedman [phone]  [phone]    Current Level of Care: Hospital Recommended Level of Care: Skilled Nursing Facility Prior Approval Number:    Date Approved/Denied:   PASRR Number: [id number]  Discharge Plan: SNF    Current Diagnoses: Patient Active Problem List   Diagnosis Date Noted   Closed right hip fracture (HCC) 12/29/2022   Normocytic anemia 12/29/2022   Type 2 diabetes mellitus without complication, with long-term current use of insulin (HCC) 12/29/2022   Essential hypertension 12/29/2022   Liver cirrhosis secondary to NASH (HCC) 12/29/2022   Obesity (BMI 30-39.9) 12/29/2022   Other pancytopenia (HCC) 12/27/2020   Thrombocytopenia (HCC) 11/17/2019    Orientation RESPIRATION BLADDER Height & Weight     Self, Time, Situation, Place  Normal Continent Weight: 181 lb (82.1 kg) Height:  5\' 4"  (162.6 cm)  BEHAVIORAL SYMPTOMS/MOOD NEUROLOGICAL BOWEL NUTRITION STATUS      Continent Diet (see discharge summary)  AMBULATORY STATUS COMMUNICATION OF NEEDS Skin   Total Care Verbally Surgical wounds                       Personal Care Assistance Level of Assistance  Bathing, Feeding, Dressing, Total care Bathing Assistance: Maximum assistance Feeding assistance: Limited assistance Dressing Assistance: Maximum assistance Total Care Assistance: Maximum assistance   Functional Limitations Info  Sight, Hearing, Speech Sight Info: Adequate Hearing Info: Adequate Speech Info: Adequate    SPECIAL CARE  FACTORS FREQUENCY  PT (By licensed PT), OT (By licensed OT)     PT Frequency: 5x week OT Frequency: 5x week            Contractures Contractures Info: Not present    Additional Factors Info  Code Status, Allergies, Insulin Sliding Scale Code Status Info: full Allergies Info: Sulfamethoxazole-trimethoprim, Acetaminophen, Ibuprofen, Meloxicam, Molds & Smuts, Naproxen Sodium, Pentazocine, Propoxyphene, Statins, Lisinopril, Penicillin G, Sulfamethoxazole   Insulin Sliding Scale Info: Novolog: see discharge summary       Current Medications (12/30/2022):  This is the current hospital active medication list Current Facility-Administered Medications  Medication Dose Route Frequency Provider Last Rate Last Admin   amLODipine (NORVASC) tablet 2.5 mg  2.5 mg Oral Daily Madelyn Flavors A, MD       aspirin EC tablet 81 mg  81 mg Oral BID McClung, Kevan D, PA       colesevelam [facility]) tablet 1,875 mg  1,875 mg Oral BID Madelyn Flavors A, MD   1,875 mg at 12/30/22 0835   docusate sodium (COLACE) capsule 100 mg  100 mg Oral BID Yolonda Kida, MD   100 mg at 12/30/22 0835   fentaNYL (SUBLIMAZE) injection 50 mcg  50 mcg Intravenous Q2H PRN Yolonda Kida, MD   50 mcg at 12/29/22 1154   furosemide (LASIX) tablet 40 mg  40 mg Oral Daily Madelyn Flavors A, MD   40 mg at 12/30/22 0835   hydrochlorothiazide (HYDRODIURIL) tablet 25 mg  25 mg Oral Daily Quenton Fetter, RPH       insulin aspart (  novoLOG) injection 0-15 Units  0-15 Units Subcutaneous TID WC Yolonda Kida, MD   5 Units at 12/30/22 1208   insulin aspart (novoLOG) injection 0-5 Units  0-5 Units Subcutaneous QHS Yolonda Kida, MD       insulin glargine-yfgn [facility]) injection 40 Units  40 Units Subcutaneous Daily Madelyn Flavors A, MD   40 Units at 12/30/22 0837   irbesartan (AVAPRO) tablet 300 mg  300 mg Oral Daily Quenton Fetter, RPH       loratadine (CLARITIN) tablet 10 mg  10 mg Oral Daily Madelyn Flavors  A, MD   10 mg at 12/30/22 1610   menthol-cetylpyridinium (CEPACOL) lozenge 3 mg  1 lozenge Oral PRN Yolonda Kida, MD       Or   phenol [facility]) mouth spray 1 spray  1 spray Mouth/Throat PRN Yolonda Kida, MD       metoCLOPramide (REGLAN) tablet 5-10 mg  5-10 mg Oral Q8H PRN Yolonda Kida, MD       Or   metoCLOPramide (REGLAN) injection 5-10 mg  5-10 mg Intravenous Q8H PRN Yolonda Kida, MD       metoprolol succinate (TOPROL-XL) 24 hr tablet 100 mg  100 mg Oral Daily Smith, Rondell A, MD   100 mg at 12/29/22 2200   morphine (PF) 2 MG/ML injection 0.5 mg  0.5 mg Intravenous Q2H PRN Yolonda Kida, MD   0.5 mg at 12/29/22 1341   ondansetron (ZOFRAN) injection 4 mg  4 mg Intravenous Q6H PRN Yolonda Kida, MD   4 mg at 12/29/22 1341   ondansetron (ZOFRAN) tablet 4 mg  4 mg Oral Q6H PRN Yolonda Kida, MD   4 mg at 12/30/22 1207   Or   ondansetron (ZOFRAN) injection 4 mg  4 mg Intravenous Q6H PRN Yolonda Kida, MD   4 mg at 12/30/22 9604   oxyCODONE (Oxy IR/ROXICODONE) immediate release tablet 5-10 mg  5-10 mg Oral Q4H PRN Yolonda Kida, MD   10 mg at 12/30/22 1207   spironolactone (ALDACTONE) tablet 100 mg  100 mg Oral Daily Madelyn Flavors A, MD   100 mg at 12/30/22 5409     Discharge Medications: Please see discharge summary for a list of discharge medications.  Relevant Imaging Results:  Relevant Lab Results:   Additional Information SSN: [id number]  Lorri Frederick, LCSW

## 2022-12-30 NOTE — Plan of Care (Signed)
  Problem: Fluid Volume: Goal: Ability to maintain a balanced intake and output will improve Outcome: Progressing   Problem: Health Behavior/Discharge Planning: Goal: Ability to manage health-related needs will improve Outcome: Progressing   Problem: Metabolic: Goal: Ability to maintain appropriate glucose levels will improve Outcome: Progressing   

## 2022-12-30 NOTE — TOC Initial Note (Addendum)
Transition of Care [facility]) - Initial/Assessment Note    Patient Details  Name: Denise Klein MRN: [id number] Date of Birth: [birth year]  [facility] of [facility]) CM/SW Contact:    Lorri Frederick, LCSW Phone Number: 12/30/2022, 1:40 PM  Clinical Narrative:    Pt oriented x4 but asleep, CSW spoke with pt husband, son Clifton Custard, and DIL Loretha Stapler.  Husband does want to DC to SNF, requesting [facility].  Pt from home with husband, no current services.  Medicare choice document provided.  Referral sent out for SNF, CSW reached out to [facility] to review.         1545: [facility] does offer, husband informed and does want to accept.            Expected Discharge Plan: Skilled Nursing Facility Barriers to Discharge: Continued Medical Work up, SNF Pending bed offer   Patient Goals and CMS Choice   CMS Medicare.gov Compare Post Acute Care list provided to:: Patient Represenative (must comment) (husband, son, daughter in law) Choice offered to / list presented to : Spouse      Expected Discharge Plan and Services In-house Referral: Clinical Social Work   Post Acute Care Choice: Skilled Nursing Facility Living arrangements for the past 2 months: Single Family Home                                      Prior Living Arrangements/Services Living arrangements for the past 2 months: Single Family Home Lives with:: Spouse Patient language and need for interpreter reviewed:: Yes        Need for Family Participation in Patient Care: Yes (Comment) Care giver support system in place?: Yes (comment) Current home services: Other (comment) (none) Criminal Activity/Legal Involvement Pertinent to Current Situation/Hospitalization: No - Comment as needed  Activities of Daily Living Home Assistive Devices/Equipment: None ADL Screening (condition at time of admission) Patient's cognitive ability adequate to safely complete daily activities?: Yes Is the patient deaf or have difficulty  hearing?: Yes Does the patient have difficulty seeing, even when wearing glasses/contacts?: No Does the patient have difficulty concentrating, remembering, or making decisions?: No Patient able to express need for assistance with ADLs?: No Does the patient have difficulty dressing or bathing?: No Independently performs ADLs?: Yes (appropriate for developmental age) Does the patient have difficulty walking or climbing stairs?: No Weakness of Legs: Right Weakness of Arms/Hands: None  Permission Sought/Granted                  Emotional Assessment Appearance:: Appears stated age Attitude/Demeanor/Rapport: Unable to Assess Affect (typically observed): Unable to Assess Orientation: : Oriented to Self, Oriented to Place, Oriented to  Time, Oriented to Situation      Admission diagnosis:  Closed right hip fracture (HCC) [S72.001A] Fall, initial encounter [W19.XXXA] Closed fracture of right hip, initial encounter (HCC) [S72.001A] Patient Active Problem List   Diagnosis Date Noted   Closed right hip fracture (HCC) 12/29/2022   Normocytic anemia 12/29/2022   Type 2 diabetes mellitus without complication, with long-term current use of insulin (HCC) 12/29/2022   Essential hypertension 12/29/2022   Liver cirrhosis secondary to NASH (HCC) 12/29/2022   Obesity (BMI 30-39.9) 12/29/2022   Other pancytopenia (HCC) 12/27/2020   Thrombocytopenia (HCC) 11/17/2019   PCP:  Lupita Raider, MD Pharmacy:   [facility] [phone] - [place], [place] - [address] [phone]  [address] [place] [address] Phone: [phone] Fax: [phone]     Social Determinants of Health (SDOH) Social History: SDOH Screenings   Tobacco Use: Low Risk  (12/29/2022)   SDOH Interventions:     Readmission Risk Interventions     No data to display

## 2022-12-30 NOTE — Evaluation (Signed)
Occupational Therapy Evaluation Patient Details Name: Denise Klein MRN: [id number] DOB: 05-05-1951 Today's Date: 12/30/2022   History of Present Illness Patient is a 72 y/o female s/p Right femur IM nailing after sustaining a right hip fx due to a mechanical fall.   Clinical Impression   Pt admitted with the above diagnosis. Pt currently with functional limitations due to the deficits listed below (see OT Problem List). Prior to admit, pt was living with husband, independent with all ADL tasks and functional mobility. [place]. Pt will benefit from acute skilled OT to increase their safety and independence with ADL and functional mobility for ADL to facilitate discharge. Patient will benefit from continued inpatient follow up therapy, <3 hours/day. OT will continue to follow patient acutely.         Recommendations for follow up therapy are one component of a multi-disciplinary discharge planning process, led by the attending physician.  Recommendations may be updated based on patient status, additional functional criteria and insurance authorization.   Assistance Recommended at Discharge Frequent or constant Supervision/Assistance  Patient can return home with the following Two people to help with walking and/or transfers;Two people to help with bathing/dressing/bathroom;Assistance with cooking/housework;Help with stairs or ramp for entrance;Assist for transportation    Functional Status Assessment  Patient has had a recent decline in their functional status and demonstrates the ability to make significant improvements in function in a reasonable and predictable amount of time.  Equipment Recommendations  Other (comment) (defer to next venue of care)       Precautions / Restrictions Precautions Precautions: Fall Restrictions Weight Bearing Restrictions: Yes RLE Weight Bearing: Weight bearing as tolerated      Mobility Bed Mobility Overal bed  mobility: Needs Assistance Bed Mobility: Rolling, Sidelying to Sit Rolling: Min assist, +2 for physical assistance Sidelying to sit: Max assist, +2 for physical assistance, HOB elevated       General bed mobility comments: VC for technique and hand over hand assist provided to reach bed rail on right side with left hand. visual demonstration also provided for technique needed to transition from sidelying to sitting EOB. Patient Response: Anxious  Transfers Overall transfer level: Needs assistance Equipment used: Rolling walker (2 wheels) Transfers: Sit to/from Stand, Bed to chair/wheelchair/BSC Sit to Stand: Mod assist, +2 physical assistance, From elevated surface     Step pivot transfers: Mod assist, +2 physical assistance     General transfer comment: VC for hand placement during RW management. Pt able to step pivot minimally to transfer from bed to [facility]. When standing up from [facility], chair swap was performed to allow patient to sit in recliner.      Balance Overall balance assessment: Needs assistance Sitting-balance support: Bilateral upper extremity supported, Feet supported Sitting balance-Leahy Scale: Fair Sitting balance - Comments: sitting EOB   Standing balance support: Bilateral upper extremity supported, During functional activity, Reliant on assistive device for balance Standing balance-Leahy Scale: Poor Standing balance comment: reliant on external support        ADL either performed or assessed with clinical judgement   ADL Overall ADL's : Needs assistance/impaired                  General ADL Comments: Pt required total assist for all BADL tasks at evaluation with increased time.     Vision Baseline Vision/History: 1 Wears glasses (reading) Ability to See in Adequate Light: 0 Adequate Patient Visual Report:  (pt did not report.) Additional  Comments: Pt voiced that she was seeing things that weren't there. (ie. gnat on the wall, a green streak on the  wall)            Pertinent Vitals/Pain Pain Assessment Pain Assessment: Faces Faces Pain Scale: Hurts whole lot Pain Location: right leg/hip during movement and mobility Pain Descriptors / Indicators: Grimacing, Guarding Pain Intervention(s): Limited activity within patient's tolerance, Monitored during session, Repositioned     Hand Dominance Right   Extremity/Trunk Assessment Upper Extremity Assessment Upper Extremity Assessment: Generalized weakness   Lower Extremity Assessment Lower Extremity Assessment: Generalized weakness   Cervical / Trunk Assessment Cervical / Trunk Assessment: Normal   Communication Communication Communication: HOH (Hearing aid right ear)   Cognition Arousal/Alertness: Awake/alert Behavior During Therapy: Anxious Overall Cognitive Status: Impaired/Different from baseline Area of Impairment: Attention, Memory, Following commands, Safety/judgement, Awareness, Problem solving        Memory: Decreased short-term memory Following Commands: Follows one step commands with increased time Safety/Judgement: Decreased awareness of safety, Decreased awareness of deficits   Problem Solving: Slow processing, Decreased initiation, Difficulty sequencing, Requires verbal cues, Requires tactile cues General Comments: required increased time for processing and initiation of tasks. Extensive multimodel cueing needed for all sequencing.     General Comments  VSS on RA. Pt reported increased dizziness during functional mobility/transfers. BP remained stable.            Home Living Family/patient expects to be discharged to:: Private residence Living Arrangements: Spouse/significant other   Type of Home: House Home Access: Stairs to enter [employer] of Steps: 3-4 steps side entrance. 5 steps back entrance Entrance Stairs-Rails: Right;Left;Can reach both Home Layout: One level     Bathroom Shower/Tub: Tub/shower unit;Walk-in shower          Home Equipment: None          Prior Functioning/Environment Prior Level of Function : Independent/Modified Independent;Driving             Mobility Comments: independent ADLs Comments: independent        OT Problem List: Decreased strength;Pain;Decreased cognition;Decreased activity tolerance;Decreased safety awareness;Impaired balance (sitting and/or standing);Decreased knowledge of use of DME or AE;Decreased knowledge of precautions      OT Treatment/Interventions: Self-care/ADL training;Therapeutic exercise;Therapeutic activities;Cognitive remediation/compensation;Neuromuscular education;Energy conservation;DME and/or AE instruction;Patient/family education;Balance training;Manual therapy    OT Goals(Current goals can be found in the care plan section) Acute Rehab OT Goals Patient Stated Goal: to go to the bathroom OT Goal Formulation: Patient unable to participate in goal setting Time For Goal Achievement: 01/13/23 Potential to Achieve Goals: Good  OT Frequency: Min 2X/week       AM-PAC OT "6 Clicks" Daily Activity     Outcome Measure Help from another person eating meals?: A Lot Help from another person taking care of personal grooming?: Total Help from another person toileting, which includes using toliet, bedpan, or urinal?: Total Help from another person bathing (including washing, rinsing, drying)?: Total Help from another person to put on and taking off regular upper body clothing?: Total Help from another person to put on and taking off regular lower body clothing?: Total 6 Click Score: 7   End of Session Equipment Utilized During Treatment: Gait belt;Rolling walker (2 wheels) Nurse Communication: Mobility status  Activity Tolerance: Patient tolerated treatment well Patient left: in chair;with call bell/phone within reach;with chair alarm set;with family/visitor present;with nursing/sitter in room  OT Visit Diagnosis: Unsteadiness on feet (R26.81);Muscle  weakness (generalized) (M62.81);History of falling (Z91.81);Pain Pain - Right/Left: Right  Pain - part of body: Leg                Time: 1113-1203 OT Time Calculation (min): 50 min Charges:  OT General Charges $OT Visit: 1 Visit OT Evaluation $OT Eval High Complexity: 1 High OT Treatments $Self Care/Home Management : 23-37 mins  Limmie Patricia, OTR/L,CBIS  Supplemental OT - [facility] and [facility] Secure Chat Preferred    Maxwel Meadowcroft, Charisse March 12/30/2022, 12:15 PM

## 2022-12-30 NOTE — Progress Notes (Signed)
Initial Nutrition Assessment  DOCUMENTATION CODES:   Not applicable  INTERVENTION:  Continue carb modified diet Consider liberalizing to regular on follow up if PO intake remains limited Ensure Enlive po BID, each supplement provides 350 kcal and 20 grams of protein. MVI with minerals daily  NUTRITION DIAGNOSIS:   Increased nutrient needs related to post-op healing, hip fracture as evidenced by estimated needs.  GOAL:   Patient will meet greater than or equal to 90% of their needs  MONITOR:   PO intake, Supplement acceptance, Labs, Weight trends  REASON FOR ASSESSMENT:   Consult Hip fracture protocol  ASSESSMENT:   Pt admitted with R hip fracture. PMH significant for HTN, T2DM, thrombocytopenia, cirrhosis, obesity.   5/20 s/p IMN of R hip fracture  Attempted to speak with pt at bedside. Other provider present at that time. Unable to obtain detailed nutrition related history at this time. Spoke with LPN who reports pt has only had a few bites today and 2 ensure supplements. Pt's limited PO intake likely d/t personal factors.   Meal completions: 5/21: 0% breakfast  There is limited documentation of weight history on file to review within the last year however pt's weight noted to remain between 80-82 kg. No significant fluctuations noted.   Medications: colace, lasix, SSI 0-15 units TID, SSI 0-5 units qhs, semglee 40 units daily  Labs: sodium 132, BUN 24, Cr 1.10, GFR 53, CBG's 126-191 x24 hours  NUTRITION - FOCUSED PHYSICAL EXAM: Pt with other provider at time of visit.   Diet Order:   Diet Order             Diet Carb Modified Fluid consistency: Thin; Room service appropriate? Yes  Diet effective now                   EDUCATION NEEDS:   No education needs have been identified at this time  Skin:  Skin Assessment: Reviewed RN Assessment (R thigh closed incision)  Last BM:  5/19  Height:   Ht Readings from Last 1 Encounters:  12/29/22 5\' 4"  (1.626  m)    Weight:   Wt Readings from Last 1 Encounters:  12/29/22 82.1 kg   BMI:  Body mass index is 31.07 kg/m.  Estimated Nutritional Needs:   Kcal:  1500-1700  Protein:  80-95g  Fluid:  >/=1.5L  Drusilla Kanner, RDN, LDN Clinical Nutrition

## 2022-12-30 NOTE — Progress Notes (Signed)
Patient due to void post surgery. Bladder scan with urine volume of 160 ml at 0040. Will continue to monitor the patient.

## 2022-12-30 NOTE — Anesthesia Postprocedure Evaluation (Signed)
Anesthesia Post Note  Patient: Denise Klein  Procedure(s) Performed: INTRAMEDULLARY (IM) NAILING OF RIGHT FEMUR (Right: Leg Upper)     Patient location during evaluation: PACU Anesthesia Type: General Level of consciousness: awake and alert Pain management: pain level controlled Vital Signs Assessment: post-procedure vital signs reviewed and stable Respiratory status: spontaneous breathing, nonlabored ventilation, respiratory function stable and patient connected to nasal cannula oxygen Cardiovascular status: blood pressure returned to baseline and stable Postop Assessment: no apparent nausea or vomiting Anesthetic complications: no  No notable events documented.  Last Vitals:  Vitals:   12/29/22 2152 12/30/22 0042  BP: (!) 152/50 (!) 131/43  Pulse: 65 64  Resp: 12 15  Temp: 36.6 C 36.7 C  SpO2: 99% 94%    Last Pain:  Vitals:   12/30/22 0600  TempSrc:   PainSc: 7                  Clemmie Marxen S

## 2022-12-30 NOTE — Evaluation (Signed)
Physical Therapy Evaluation Patient Details Name: Denise Klein MRN: [id number] DOB: 25-Feb-1951 Today's Date: 12/30/2022  History of Present Illness  Patient is a 72 y/o female s/p Right femur IM nailing after sustaining a right hip fx due to a mechanical fall.  Clinical Impression  Patient received in bed, OT present on arrival just getting started with evaluation. Daughter in law, [name] present for session. Patient is anxious and very HOH. Has hearing aid in right ear. Patient required min A +2 for rolling, max +2 for side lying to sit. Mod A +2 for sit to stand with increased time needed and multimodal cues. Patient was able to pivot to [facility] with RW and mod A +2. She requires increased time to process directions during mobility. Patient will continue to benefit from skilled PT to improve strength and safety for independence with mobility.           Recommendations for follow up therapy are one component of a multi-disciplinary discharge planning process, led by the attending physician.  Recommendations may be updated based on patient status, additional functional criteria and insurance authorization.  Follow Up Recommendations Can patient physically be transported by private vehicle: No     Assistance Recommended at Discharge Frequent or constant Supervision/Assistance  Patient can return home with the following  A lot of help with walking and/or transfers;A lot of help with bathing/dressing/bathroom;Assist for transportation    Equipment Recommendations Rolling walker (2 wheels);BSC/3in1  Recommendations for Other Services       Functional Status Assessment Patient has had a recent decline in their functional status and demonstrates the ability to make significant improvements in function in a reasonable and predictable amount of time.     Precautions / Restrictions Precautions Precautions: Fall Restrictions Weight Bearing Restrictions: Yes RLE Weight Bearing: Weight bearing  as tolerated      Mobility  Bed Mobility Overal bed mobility: Needs Assistance Bed Mobility: Rolling, Sidelying to Sit Rolling: Min assist, +2 for physical assistance Sidelying to sit: +2 for physical assistance, Max assist, HOB elevated       General bed mobility comments: VC for technique and hand over hand assist provided to reach bed rail on right side with left hand. visual demonstration also provided for technique needed to transition from sidelying to sitting EOB.    Transfers Overall transfer level: Needs assistance Equipment used: Rolling walker (2 wheels) Transfers: Sit to/from Stand, Bed to chair/wheelchair/BSC Sit to Stand: Mod assist, +2 physical assistance, From elevated surface   Step pivot transfers: Mod assist, +2 physical assistance       General transfer comment: VC for hand placement during RW management. Pt able to step pivot minimally to transfer from bed to [facility]. When standing up from [facility], chair swap was performed to allow patient to sit in recliner.    Ambulation/Gait               General Gait Details: unable  Stairs            Wheelchair Mobility    Modified Rankin (Stroke Patients Only)       Balance Overall balance assessment: Needs assistance Sitting-balance support: Feet supported, Bilateral upper extremity supported Sitting balance-Leahy Scale: Fair Sitting balance - Comments: sitting EOB   Standing balance support: Bilateral upper extremity supported, During functional activity, Reliant on assistive device for balance Standing balance-Leahy Scale: Poor Standing balance comment: reliant on external support  Pertinent Vitals/Pain Pain Assessment Pain Assessment: Faces Faces Pain Scale: Hurts even more Pain Location: right leg/hip during movement and mobility Pain Descriptors / Indicators: Grimacing, Guarding Pain Intervention(s): Monitored during session, Repositioned    Home  Living Family/patient expects to be discharged to:: Skilled nursing facility Living Arrangements: Spouse/significant other Available Help at Discharge: Family;Available 24 hours/day Type of Home: House Home Access: Stairs to enter Entrance Stairs-Rails: Right;Left;Can reach both Entrance Stairs-Number of Steps: 3-4 steps side entrance. 5 steps back entrance   Home Layout: One level Home Equipment: None      Prior Function Prior Level of Function : Independent/Modified Independent;Driving             Mobility Comments: independent ADLs Comments: independent     Hand Dominance   Dominant Hand: Right    Extremity/Trunk Assessment   Upper Extremity Assessment Upper Extremity Assessment: Defer to OT evaluation    Lower Extremity Assessment Lower Extremity Assessment: Generalized weakness;RLE deficits/detail RLE: Unable to fully assess due to pain RLE Coordination: decreased gross motor    Cervical / Trunk Assessment Cervical / Trunk Assessment: Normal  Communication   Communication: HOH  Cognition Arousal/Alertness: Awake/alert Behavior During Therapy: Anxious Overall Cognitive Status: Impaired/Different from baseline Area of Impairment: Attention, Memory, Following commands, Safety/judgement, Awareness, Problem solving                   Current Attention Level: Sustained Memory: Decreased short-term memory Following Commands: Follows one step commands with increased time Safety/Judgement: Decreased awareness of safety, Decreased awareness of deficits Awareness: Emergent Problem Solving: Slow processing, Decreased initiation, Difficulty sequencing, Requires verbal cues, Requires tactile cues General Comments: required increased time for processing and initiation of tasks. Extensive multimodel cueing needed for all sequencing.        General Comments General comments (skin integrity, edema, etc.): VSS on RA. Pt reported increased dizziness during  functional mobility/transfers. BP remained stable.    Exercises     Assessment/Plan    PT Assessment Patient needs continued PT services  PT Problem List Decreased strength;Decreased range of motion;Decreased activity tolerance;Decreased balance;Decreased mobility;Decreased safety awareness;Decreased knowledge of use of DME;Decreased cognition;Decreased coordination;Pain;Decreased skin integrity       PT Treatment Interventions DME instruction;Gait training;Therapeutic exercise;Balance training;Stair training;Functional mobility training;Therapeutic activities;Patient/family education;Cognitive remediation;Neuromuscular re-education;Modalities    PT Goals (Current goals can be found in the Care Plan section)  Acute Rehab PT Goals Patient Stated Goal: to improve PT Goal Formulation: With family Time For Goal Achievement: 01/09/23 Potential to Achieve Goals: Fair    Frequency Min 3X/week     Co-evaluation PT/OT/SLP Co-Evaluation/Treatment: Yes Reason for Co-Treatment: For patient/therapist safety;To address functional/ADL transfers PT goals addressed during session: Mobility/safety with mobility;Balance;Proper use of DME         AM-PAC PT "6 Clicks" Mobility  Outcome Measure Help needed turning from your back to your side while in a flat bed without using bedrails?: A Little Help needed moving from lying on your back to sitting on the side of a flat bed without using bedrails?: A Lot Help needed moving to and from a bed to a chair (including a wheelchair)?: A Lot Help needed standing up from a chair using your arms (e.g., wheelchair or bedside chair)?: A Lot Help needed to walk in hospital room?: Total Help needed climbing 3-5 steps with a railing? : Total 6 Click Score: 11    End of Session Equipment Utilized During Treatment: Gait belt Activity Tolerance: Patient limited by fatigue;Patient limited by pain;Other (  comment) (anxious) Patient left: in chair;with call  bell/phone within reach;with nursing/sitter in room;with family/visitor present Nurse Communication: Mobility status PT Visit Diagnosis: Unsteadiness on feet (R26.81);Other abnormalities of gait and mobility (R26.89);Muscle weakness (generalized) (M62.81);History of falling (Z91.81);Difficulty in walking, not elsewhere classified (R26.2);Pain Pain - Right/Left: Right Pain - part of body: Hip    Time: 1127-1200 PT Time Calculation (min) (ACUTE ONLY): 33 min   Charges:   PT Evaluation $PT Eval Moderate Complexity: 1 Mod          .Keishla Oyer, PT, GCS 12/30/22,12:37 PM

## 2022-12-30 NOTE — Progress Notes (Signed)
PROGRESS NOTE    RYLEI Klein  WGN:[id number] DOB: [birth year] DOA: 12/29/2022 PCP: Lupita Raider, MD     Brief Narrative:  Denise Klein is a 72 y.o. female with medical history significant of hypertension, diabetes mellitus type 2, thrombocytopenia, cirrhosis, and obesity who presents after having a fall with complaints of right hip pain.  Patient had been outside feeding the goats when she slipped and fell and landed on her right side.  She reported having severe pain in the right hip and was unable to ambulate.  Normally patient is able to ambulate and perform all of her activities of daily living without need of assistance.  Patient's daughter confirms that she has been taking 170 units of [place] daily, but blood sugars have been in the 40s in the mornings here recently.   In the emergency department patient was noted to be afebrile with blood pressures elevated up to 179/52, and all other vital signs maintained.  Labs significant for hemoglobin 11.3, platelets 79, CO2 17, glucose 214, and calcium 8.6.  Chest x-ray noted no acute abnormality.  X-rays of the right hip noted a moderately displaced and communicated intertrochanteric fracture of the proximal right femur.    Patient was admitted to the hospital and orthopedic surgery consulted.  She underwent intramedullary implant of right lower extremity.  New events last 24 hours / Subjective: Patient visiting with family today.  Tearful, has some social things going on.  Physically, she is doing well.  Discussed our plans for PT evaluation.  Assessment & Plan:   Principal Problem:   Closed right hip fracture (HCC) Active Problems:   Normocytic anemia   Type 2 diabetes mellitus without complication, with long-term current use of insulin (HCC)   Essential hypertension   Liver cirrhosis secondary to NASH (HCC)   Thrombocytopenia (HCC)   Obesity (BMI 30-39.9)   Closed right hip fracture -Status post fixation by Dr. Aundria Rud  5/20 -PT OT -Pain control -On discharge, will be on baby aspirin once daily for 6 weeks for DVT prophylaxis  Diabetes mellitus type 2 with insulin use -A1c 6.5 -Semglee, sliding scale insulin  Hypertension -Norvasc, Lasix, HCTZ, Avapro, Aldactone -Blood pressure stable this morning  NASH cirrhosis -Lasix, Aldactone -Stable  Thrombocytopenia -Stable  Obesity -Estimated body mass index is 31.07 kg/m as calculated from the following:   Height as of this encounter: 5\' 4"  (1.626 m).   Weight as of this encounter: 82.1 kg.  DVT prophylaxis:  SCDs Start: 12/29/22 1827 Place TED hose Start: 12/29/22 1304  Code Status: Full code Family Communication: Spouse and daughter-in-law at bedside Disposition Plan: Pending PT evaluation Status is: Inpatient Remains inpatient appropriate because: PT eval    Antimicrobials:  Anti-infectives (From admission, onward)    Start     Dose/Rate Route Frequency Ordered Stop   12/30/22 0600  ceFAZolin (ANCEF) IVPB 2g/100 mL premix        2 g 200 mL/hr over 30 Minutes Intravenous On call to O.R. 12/29/22 1503 12/29/22 1617   12/29/22 2200  ceFAZolin (ANCEF) IVPB 2g/100 mL premix        2 g 200 mL/hr over 30 Minutes Intravenous Every 6 hours 12/29/22 1826 12/30/22 0410   12/29/22 1512  ceFAZolin (ANCEF) 2-4 GM/100ML-% IVPB       Note to Pharmacy: Garen Lah E: cabinet override      12/29/22 1512 12/29/22 1630        Objective: Vitals:   12/30/22 7846 12/30/22 9629 12/30/22 5284  12/30/22 1203  BP: (!) 131/43 (!) 106/32 (!) 135/42 (!) 128/43  Pulse: 64 68 65 70  Resp: 15 18  17   Temp: 98 F (36.7 C) 98.4 F (36.9 C)  98.4 F (36.9 C)  TempSrc: Oral Oral  Oral  SpO2: 94% 98%  96%  Weight:      Height:        Intake/Output Summary (Last 24 hours) at 12/30/2022 1206 Last data filed at 12/30/2022 1610 Gross per 24 hour  Intake 915.17 ml  Output 350 ml  Net 565.17 ml   Filed Weights   12/29/22 1032  Weight: 82.1 kg     Examination:  General exam: Appears comfortable  Respiratory system: Clear to auscultation. Respiratory effort normal. No respiratory distress. No conversational dyspnea.  Cardiovascular system: S1 & S2 heard, RRR. No murmurs. No pedal edema. Gastrointestinal system: Abdomen is nondistended, soft and nontender. Normal bowel sounds heard. Central nervous system: Alert and oriented Extremities: Symmetric in appearance  Skin: No rashes, lesions or ulcers on exposed skin    Data Reviewed: I have personally reviewed following labs and imaging studies  CBC: Recent Labs  Lab 12/29/22 1047 12/30/22 0805  WBC 5.5 11.1*  NEUTROABS 3.3  --   HGB 11.3* 10.3*  HCT 33.9* 29.1*  MCV 92.9 87.7  PLT 79* 105*   Basic Metabolic Panel: Recent Labs  Lab 12/29/22 1047 12/30/22 0805  NA 136 132*  K 4.5 4.8  CL 109 103  CO2 17* 20*  GLUCOSE 214* 168*  BUN 17 24*  CREATININE 0.82 1.10*  CALCIUM 8.6* 8.7*   GFR: Estimated Creatinine Clearance: 47.9 mL/min (A) (by C-G formula based on SCr of 1.1 mg/dL (H)). Liver Function Tests: No results for input(s): "AST", "ALT", "ALKPHOS", "BILITOT", "PROT", "ALBUMIN" in the last 168 hours. No results for input(s): "LIPASE", "AMYLASE" in the last 168 hours. No results for input(s): "AMMONIA" in the last 168 hours. Coagulation Profile: Recent Labs  Lab 12/29/22 1047  INR 1.4*   Cardiac Enzymes: No results for input(s): "CKTOTAL", "CKMB", "CKMBINDEX", "TROPONINI" in the last 168 hours. BNP (last 3 results) No results for input(s): "PROBNP" in the last 8760 hours. HbA1C: Recent Labs    12/30/22 0805  HGBA1C 6.5*   CBG: Recent Labs  Lab 12/29/22 1724 12/29/22 1846 12/29/22 2049 12/30/22 0749 12/30/22 1106  GLUCAP 126* 163* 145* 170* 208*   Lipid Profile: No results for input(s): "CHOL", "HDL", "LDLCALC", "TRIG", "CHOLHDL", "LDLDIRECT" in the last 72 hours. Thyroid Function Tests: No results for input(s): "TSH", "T4TOTAL",  "FREET4", "T3FREE", "THYROIDAB" in the last 72 hours. Anemia Panel: No results for input(s): "VITAMINB12", "FOLATE", "FERRITIN", "TIBC", "IRON", "RETICCTPCT" in the last 72 hours. Sepsis Labs: No results for input(s): "PROCALCITON", "LATICACIDVEN" in the last 168 hours.  Recent Results (from the past 240 hour(s))  Surgical pcr screen     Status: None   Collection Time: 12/29/22  1:47 PM   Specimen: Nasal Mucosa; Nasal Swab  Result Value Ref Range Status   MRSA, PCR NEGATIVE NEGATIVE Final   Staphylococcus aureus NEGATIVE NEGATIVE Final    Comment: (NOTE) The Xpert SA Assay (FDA approved for NASAL specimens in patients 39 years of age and older), is one component of a comprehensive surveillance program. It is not intended to diagnose infection nor to guide or monitor treatment. Performed at [facility], [address], [place], [place] [address]       Radiology Studies: DG FEMUR, MIN 2 VIEWS RIGHT  Result  Date: 12/29/2022 CLINICAL DATA:  Right hip fracture status post ORIF EXAM: RIGHT FEMUR 2 VIEWS COMPARISON:  12/29/2022 FINDINGS: 6 fluoroscopic images are obtained during the performance of the procedure and are provided for interpretation only. Images demonstrate inter trochanteric right hip fracture, with subsequent placement of an intramedullary rod with proximal dynamic and distal interlocking screw. Alignment is anatomic. Fluoroscopy time: 46 seconds, 9.31 mGy IMPRESSION: 1. ORIF of an intertrochanteric right hip fracture as above. Anatomic alignment. Electronically Signed   By: Sharlet Salina M.D.   On: 12/29/2022 17:09   DG C-Arm 1-60 Min-No Report  Result Date: 12/29/2022 Fluoroscopy was utilized by the requesting physician.  No radiographic interpretation.   DG Hip Unilat  With Pelvis 2-3 Views Right  Result Date: 12/29/2022 CLINICAL DATA:  Fall. EXAM: DG HIP (WITH OR WITHOUT PELVIS) 2-3V RIGHT COMPARISON:  None Available. FINDINGS: Moderately displaced and  comminuted fracture is seen involving intertrochanteric portion of right proximal femur. IMPRESSION: Moderately displaced and comminuted intertrochanteric fracture of proximal right femur. Electronically Signed   By: Lupita Raider M.D.   On: 12/29/2022 11:34   DG Chest Port 1 View  Result Date: 12/29/2022 CLINICAL DATA:  Fall. EXAM: PORTABLE CHEST 1 VIEW COMPARISON:  May 07, 2022. FINDINGS: Stable cardiomediastinal silhouette. Both lungs are clear. The visualized skeletal structures are unremarkable. IMPRESSION: No active disease. Electronically Signed   By: Lupita Raider M.D.   On: 12/29/2022 11:32      Scheduled Meds:  amLODipine  2.5 mg Oral Daily   colesevelam  1,875 mg Oral BID   docusate sodium  100 mg Oral BID   furosemide  40 mg Oral Daily   hydrochlorothiazide  25 mg Oral Daily   insulin aspart  0-15 Units Subcutaneous TID WC   insulin aspart  0-5 Units Subcutaneous QHS   insulin glargine-yfgn  40 Units Subcutaneous Daily   irbesartan  300 mg Oral Daily   loratadine  10 mg Oral Daily   metoprolol succinate  100 mg Oral Daily   spironolactone  100 mg Oral Daily   Continuous Infusions:   LOS: 1 day   Time spent: 35 minutes   Noralee Stain, DO [facility] 12/30/2022, 12:06 PM   Available via Epic secure chat 7am-7pm After these hours, please refer to coverage provider listed on [email or web address]

## 2022-12-30 NOTE — Progress Notes (Signed)
   Subjective:  Denise Klein is a 72 y.o. female, 1 Day Post-Op    s/p Procedure(s): INTRAMEDULLARY (IM) NAILING OF RIGHT FEMUR   Patient reports pain as moderate.  Worked with OT. Little groggy today, family present.   Objective:   VITALS:   Vitals:   12/30/22 0042 12/30/22 0738 12/30/22 0849 12/30/22 1203  BP: (!) 131/43 (!) 106/32 (!) 135/42 (!) 128/43  Pulse: 64 68 65 70  Resp: 15 18  17   Temp: 98 F (36.7 C) 98.4 F (36.9 C)  98.4 F (36.9 C)  TempSrc: Oral Oral  Oral  SpO2: 94% 98%  96%  Weight:      Height:       In hospital chair NAD  RLE:   Neurovascular intact Sensation intact distally Intact pulses distally Dorsiflexion/Plantar flexion intact Incision: dressing C/D/I Compartment soft [place]an dressing present  Lab Results  Component Value Date   WBC 11.1 (H) 12/30/2022   HGB 10.3 (L) 12/30/2022   HCT 29.1 (L) 12/30/2022   MCV 87.7 12/30/2022   PLT 105 (L) 12/30/2022   BMET    Component Value Date/Time   NA 132 (L) 12/30/2022 0805   K 4.8 12/30/2022 0805   CL 103 12/30/2022 0805   CO2 20 (L) 12/30/2022 0805   GLUCOSE 168 (H) 12/30/2022 0805   BUN 24 (H) 12/30/2022 0805   CREATININE 1.10 (H) 12/30/2022 0805   CREATININE 0.67 11/17/2019 1216   CALCIUM 8.7 (L) 12/30/2022 0805   GFRNONAA 53 (L) 12/30/2022 0805   GFRNONAA >60 11/17/2019 1216     Assessment/Plan: 1 Day Post-Op   Principal Problem:   Closed right hip fracture (HCC) Active Problems:   Thrombocytopenia (HCC)   Normocytic anemia   Type 2 diabetes mellitus without complication, with long-term current use of insulin (HCC)   Essential hypertension   Liver cirrhosis secondary to NASH (HCC)   Obesity (BMI 30-39.9)   Advance diet Up with therapy  Dispo: Pending PT Eval , likely SNF  Weightbearing Status: WBAT RLE DVT Prophylaxis: Aspirin 81mg  BID   Dressings are waterproof ok to shower  [name] 12/30/2022, 12:15 PM  Dion Saucier PA-C  Physician Assistant  with Dr. Rebekah [place] [facility]

## 2022-12-31 DIAGNOSIS — S72001A Fracture of unspecified part of neck of right femur, initial encounter for closed fracture: Secondary | ICD-10-CM

## 2022-12-31 LAB — CBC
HCT: 26.1 % — ABNORMAL LOW (ref 36.0–46.0)
Hemoglobin: 9.1 g/dL — ABNORMAL LOW (ref 12.0–15.0)
MCH: 31 pg (ref 26.0–34.0)
MCHC: 34.9 g/dL (ref 30.0–36.0)
MCV: 88.8 fL (ref 80.0–100.0)
Platelets: 86 10*3/uL — ABNORMAL LOW (ref 150–400)
RBC: 2.94 MIL/uL — ABNORMAL LOW (ref 3.87–5.11)
RDW: 14.9 % (ref 11.5–15.5)
WBC: 11.7 10*3/uL — ABNORMAL HIGH (ref 4.0–10.5)
nRBC: 0 % (ref 0.0–0.2)

## 2022-12-31 LAB — BASIC METABOLIC PANEL
Anion gap: 6 (ref 5–15)
BUN: 31 mg/dL — ABNORMAL HIGH (ref 8–23)
CO2: 21 mmol/L — ABNORMAL LOW (ref 22–32)
Calcium: 8.6 mg/dL — ABNORMAL LOW (ref 8.9–10.3)
Chloride: 102 mmol/L (ref 98–111)
Creatinine, Ser: 1.07 mg/dL — ABNORMAL HIGH (ref 0.44–1.00)
GFR, Estimated: 55 mL/min — ABNORMAL LOW (ref >60)
Glucose, Bld: 181 mg/dL — ABNORMAL HIGH (ref 70–99)
Potassium: 5.3 mmol/L — ABNORMAL HIGH (ref 3.5–5.1)
Sodium: 129 mmol/L — ABNORMAL LOW (ref 135–145)

## 2022-12-31 LAB — GLUCOSE, CAPILLARY
Glucose-Capillary: 188 mg/dL — ABNORMAL HIGH (ref 70–99)
Glucose-Capillary: 263 mg/dL — ABNORMAL HIGH (ref 70–99)
Glucose-Capillary: 335 mg/dL — ABNORMAL HIGH (ref 70–99)
Glucose-Capillary: 391 mg/dL — ABNORMAL HIGH (ref 70–99)

## 2022-12-31 LAB — POTASSIUM: Potassium: 5.1 mmol/L (ref 3.5–5.1)

## 2022-12-31 LAB — AMMONIA: Ammonia: 39 umol/L — ABNORMAL HIGH (ref 9–35)

## 2022-12-31 MED ORDER — ASPIRIN 81 MG PO TBEC: 81.0000 mg | DELAYED_RELEASE_TABLET | Freq: Two times a day (BID) | ORAL | 0 refills | Status: AC

## 2022-12-31 MED ORDER — LACTULOSE 10 GM/15ML PO SOLN
10.0000 g | Freq: Every day | ORAL | Status: DC
  Administered 2022-12-31 – 2023-01-02 (×3): 10 g via ORAL
  Filled 2022-12-31 (×3): qty 15

## 2022-12-31 NOTE — Plan of Care (Signed)
  Problem: Pain Managment: Goal: General experience of comfort will improve Outcome: Progressing   Problem: Safety: Goal: Ability to remain free from injury will improve Outcome: Progressing   Problem: Skin Integrity: Goal: Risk for impaired skin integrity will decrease Outcome: Progressing   Problem: Activity: Goal: Ability to ambulate and perform ADLs will improve Outcome: Progressing   Problem: Self-Concept: Goal: Ability to maintain and perform role responsibilities to the fullest extent possible will improve Outcome: Progressing   Problem: Pain Management: Goal: Pain level will decrease Outcome: Progressing

## 2022-12-31 NOTE — Progress Notes (Signed)
PROGRESS NOTE    Denise Klein  HYQ:[id number] DOB: 1951-05-29 DOA: 12/29/2022 PCP: Lupita Raider, MD   Brief Narrative: Denise Klein is a 72 y.o. female with a history of diabetes mellitus type 2, hypertension, thrombocytopenia, cirrhosis, obesity.  Patient presented secondary to a fall and subsequent right hip pain and found to have a right hip fracture.  Orthopedic surgery was consulted and performed IM nail placement on 5/20.  Patient is weightbearing as tolerated with recommendations for SNF on discharge.   Assessment and Plan:  Right hip fracture Secondary to fall with subsequent right hip pain, found to have right hip fracture.  Orthopedic surgery consulted on admission and performed IM implant.  Orthopedic surgery recommendation for weightbearing as tolerated of right lower extremity, aspirin 81 mg twice daily, okay to shower with dressing.  PT/OT recommending SNF on discharge.  Diabetes mellitus type 2 Hemoglobin A1C of 6.5% which is well controlled for age. Patient is managed on insulin degludec on NSTEMI units daily in addition to metformin XR 500 mg daily.  Patient started on insulin glargine 40 units daily on admission in addition to SSI.  Complicated by 1 dose of dexamethasone. -Continue insulin glargine 40 units daily -Continue sliding scale insulin -Likely decrease insulin dosing at discharge.  With such a significant discrepancy between inpatient and outpatient dosing, possible dietary intake may be playing a factor  Hyperkalemia Possibly secondary to kidney dysfunction but also could be related to spironolactone. -Repeat potassium this evening and hold spironolactone if continues to rise.  Primary hypertension Patient is on olmesartan-hydrochlorothiazide, spironolactone, metoprolol as an outpatient. -Continue spironolactone and metoprolol -Hold ARB and hydrochlorothiazide for now  Elevated BUN No evidence of bleeding at this time.  Possibly related to mild  kidney impairment.  Fluid status difficult to ascertain but patient appears to be euvolemic. -Repeat BMP in a.m.  NASH cirrhosis Stable. No evidence of fluid overload. Ammonia slightly elevated at 39; patient has some confusion but family states this is close to baseline. -Lactulose 10 g daily -Continue spironolactone and furosemide  Depressed mood Noticed by family. Patient with loss in family one year prior. Recommend outpatient follow-up with PCP with consideration of psychotherapy vs medication management.  Thrombocytopenia Chronic and stable.  No evidence of bleeding at this time.  Obesity Estimated body mass index is 31.07 kg/m as calculated from the following:   Height as of this encounter: 5\' 4"  (1.626 m).   Weight as of this encounter: 82.1 kg.   DVT prophylaxis: Aspirin 81 BID per orthopedic surgery Code Status:   Code Status: Full Code Family Communication: Son and daughter at baseline Disposition Plan: Discharge to SNF once bed is available/insurance authorization obtained in addition to stability of electrolytes/renal function   Consultants:  Orthopedic surgery  Procedures:  5/20: Treatment of intertrochanteric, pertrochanteric, subtrochanteric fracture with intramedullary implant  Antimicrobials: None   Subjective: Patient reports ability to move leg. No significant pain of right hip.   Objective: BP (!) 135/43 (BP Location: Right Arm)   Pulse 68   Temp 98.4 F (36.9 C)   Resp 18   Ht 5\' 4"  (1.626 m)   Wt 82.1 kg   SpO2 95%   BMI 31.07 kg/m   Examination:  General exam: Appears calm and comfortable Respiratory system: Clear to auscultation. Respiratory effort normal. Cardiovascular system: S1 & S2 heard, RRR. No murmurs, rubs, gallops or clicks. Gastrointestinal system: Abdomen is nondistended, soft and nontender. No organomegaly or masses felt. Normal bowel sounds heard.  Central nervous system: Alert and oriented. No focal neurological  deficits. Musculoskeletal: No edema. No calf tenderness Skin: No cyanosis. No rashes Psychiatry: Judgement and insight appear normal. Mood & affect appropriate.    Data Reviewed: I have personally reviewed following labs and imaging studies  CBC Lab Results  Component Value Date   WBC 11.7 (H) 12/31/2022   RBC 2.94 (L) 12/31/2022   HGB 9.1 (L) 12/31/2022   HCT 26.1 (L) 12/31/2022   MCV 88.8 12/31/2022   MCH 31.0 12/31/2022   PLT 86 (L) 12/31/2022   MCHC 34.9 12/31/2022   RDW 14.9 12/31/2022   LYMPHSABS 1.4 12/29/2022   MONOABS 0.4 12/29/2022   EOSABS 0.3 12/29/2022   BASOSABS 0.0 12/29/2022     Last metabolic panel Lab Results  Component Value Date   NA 129 (L) 12/31/2022   K 5.3 (H) 12/31/2022   CL 102 12/31/2022   CO2 21 (L) 12/31/2022   BUN 31 (H) 12/31/2022   CREATININE 1.07 (H) 12/31/2022   GLUCOSE 181 (H) 12/31/2022   GFRNONAA 55 (L) 12/31/2022   GFRAA >60 11/17/2019   CALCIUM 8.6 (L) 12/31/2022   PROT 6.5 11/17/2019   ALBUMIN 3.5 11/17/2019   BILITOT 1.3 (H) 11/17/2019   ALKPHOS 84 11/17/2019   AST 35 11/17/2019   ALT 35 11/17/2019   ANIONGAP 6 12/31/2022    GFR: Estimated Creatinine Clearance: 49.3 mL/min (A) (by C-G formula based on SCr of 1.07 mg/dL (H)).  Recent Results (from the past 240 hour(s))  Surgical pcr screen     Status: None   Collection Time: 12/29/22  1:47 PM   Specimen: Nasal Mucosa; Nasal Swab  Result Value Ref Range Status   MRSA, PCR NEGATIVE NEGATIVE Final   Staphylococcus aureus NEGATIVE NEGATIVE Final    Comment: (NOTE) The Xpert SA Assay (FDA approved for NASAL specimens in patients 17 years of age and older), is one component of a comprehensive surveillance program. It is not intended to diagnose infection nor to guide or monitor treatment. Performed at [facility], [address], [place], [place] [address]       Radiology Studies: DG FEMUR, MIN 2 VIEWS RIGHT  Result Date: 12/29/2022 CLINICAL DATA:  Right  hip fracture status post ORIF EXAM: RIGHT FEMUR 2 VIEWS COMPARISON:  12/29/2022 FINDINGS: 6 fluoroscopic images are obtained during the performance of the procedure and are provided for interpretation only. Images demonstrate inter trochanteric right hip fracture, with subsequent placement of an intramedullary rod with proximal dynamic and distal interlocking screw. Alignment is anatomic. Fluoroscopy time: 46 seconds, 9.31 mGy IMPRESSION: 1. ORIF of an intertrochanteric right hip fracture as above. Anatomic alignment. Electronically Signed   By: Sharlet Salina M.D.   On: 12/29/2022 17:09   DG C-Arm 1-60 Min-No Report  Result Date: 12/29/2022 Fluoroscopy was utilized by the requesting physician.  No radiographic interpretation.   DG Hip Unilat  With Pelvis 2-3 Views Right  Result Date: 12/29/2022 CLINICAL DATA:  Fall. EXAM: DG HIP (WITH OR WITHOUT PELVIS) 2-3V RIGHT COMPARISON:  None Available. FINDINGS: Moderately displaced and comminuted fracture is seen involving intertrochanteric portion of right proximal femur. IMPRESSION: Moderately displaced and comminuted intertrochanteric fracture of proximal right femur. Electronically Signed   By: Lupita Raider M.D.   On: 12/29/2022 11:34   DG Chest Port 1 View  Result Date: 12/29/2022 CLINICAL DATA:  Fall. EXAM: PORTABLE CHEST 1 VIEW COMPARISON:  May 07, 2022. FINDINGS: Stable cardiomediastinal silhouette. Both lungs are clear. The  visualized skeletal structures are unremarkable. IMPRESSION: No active disease. Electronically Signed   By: Lupita Raider M.D.   On: 12/29/2022 11:32      LOS: 2 days    Jacquelin Hawking, MD [facility] 12/31/2022, 8:38 AM   If 7PM-7AM, please contact night-coverage [email or web address]

## 2022-12-31 NOTE — Inpatient Diabetes Management (Signed)
Inpatient Diabetes Program Recommendations  AACE/ADA: New Consensus Statement on Inpatient Glycemic Control (2015)  Target Ranges:  Prepandial:   less than 140 mg/dL      Peak postprandial:   less than 180 mg/dL (1-2 hours)      Critically ill patients:  140 - 180 mg/dL   Lab Results  Component Value Date   GLUCAP 188 (H) 12/31/2022   HGBA1C 6.5 (H) 12/30/2022    Review of Glycemic Control  Latest Reference Range & Units 12/30/22 07:49 12/30/22 11:06 12/30/22 16:15 12/30/22 20:36 12/31/22 08:00  Glucose-Capillary 70 - 99 mg/dL 161 (H) 096 (H) 045 (H) 233 (H) 188 (H)   Diabetes history: DM 2 Outpatient Diabetes medications: Tresiba 170 units Daily, Metformin 500 mg Daily Current orders for Inpatient glycemic control:  Semglee 40 units Daily Novolog 0-15 units tid + hs  Ensure Enlive bid between meals A1c 6.5% on 5/21 Decadron 4 mg administered on 5/20  Inpatient Diabetes Program Recommendations:    -  consider adding Novolog 2 units tid meal coverage if eating >50% of meals.  Thanks,  Christena Deem RN, MSN, BC-ADM Inpatient Diabetes Coordinator Team Pager [phone] (8a-5p)

## 2022-12-31 NOTE — TOC Progression Note (Signed)
Transition of Care [facility]) - Progression Note    Patient Details  Name: Denise Klein MRN: [id number] Date of Birth: Aug 05, 1951  Transition of Care [facility]) CM/SW Contact  Lorri Frederick, LCSW Phone Number: 12/31/2022, 12:08 PM  Clinical Narrative:   [facility] request submitted in [place] and approved: [id number], 6 days: 5/23-5/28.      Expected Discharge Plan: Skilled Nursing Facility Barriers to Discharge: Continued Medical Work up, SNF Pending bed offer  Expected Discharge Plan and Services In-house Referral: Clinical Social Work   Post Acute Care Choice: Skilled Nursing Facility Living arrangements for the past 2 months: Single Family Home                                       Social Determinants of Health (SDOH) Interventions SDOH Screenings   Tobacco Use: Low Risk  (12/30/2022)    Readmission Risk Interventions     No data to display

## 2022-12-31 NOTE — Progress Notes (Signed)
Mobility Specialist Progress Note   12/31/22 1105  Mobility  Activity Transferred from bed to chair  Level of Assistance Moderate assist, patient does 50-74%  Assistive Device Other (Comment) (HHA)  Range of Motion/Exercises Active;All extremities  RLE Weight Bearing WBAT  Activity Response Tolerated well   Patient received in supine and agreeable to participate. Stated she was wet and requested bath/pericare prior to getting up. NT present and performed pericare with total A +2.  Needed max A +2 and multimodal cueing for all bed mobility including side-lying to sitting. Required moderate HHA to complete forward-facing stand/pivot to recliner chair. Tolerated without complaint or incident. Was left in recliner with all needs met, call bell in reach.   Denise Klein, BS EXP Mobility Specialist Please contact via SecureChat or Rehab office at [phone]

## 2022-12-31 NOTE — Hospital Course (Signed)
Denise Klein is a 72 y.o. female with a history of diabetes mellitus type 2, hypertension, thrombocytopenia, cirrhosis, obesity.  Patient presented secondary to a fall and subsequent right hip pain and found to have a right hip fracture.  Orthopedic surgery was consulted and performed IM nail placement on 5/20.  Patient is weightbearing as tolerated with recommendations for SNF on discharge.

## 2022-12-31 NOTE — Progress Notes (Addendum)
   Subjective:  Denise Klein is a 72 y.o. female, 2 Days Post-Op    s/p Procedure(s): INTRAMEDULLARY (IM) NAILING OF RIGHT FEMUR   Patient reports pain as mild to moderate. Doing better today, has had mobility come in but not PT today. Denies n/t.   Objective:   VITALS:   Vitals:   12/30/22 1203 12/30/22 2105 12/31/22 0806 12/31/22 1203  BP: (!) 128/43 (!) 146/43 (!) 135/43 (!) 138/48  Pulse: 70 75 68   Resp: 17 18 18    Temp: 98.4 F (36.9 C) 98 F (36.7 C) 98.4 F (36.9 C)   TempSrc: Oral     SpO2: 96% 100% 95%   Weight:      Height:       In hospital chair NAD  RLE:   Neurovascular intact Sensation intact distally Intact pulses distally Dorsiflexion/Plantar flexion intact Incision: dressing C/D/I Compartment soft Ioban dressing present  Lab Results  Component Value Date   WBC 11.7 (H) 12/31/2022   HGB 9.1 (L) 12/31/2022   HCT 26.1 (L) 12/31/2022   MCV 88.8 12/31/2022   PLT 86 (L) 12/31/2022   BMET    Component Value Date/Time   NA 129 (L) 12/31/2022 0235   K 5.3 (H) 12/31/2022 0235   CL 102 12/31/2022 0235   CO2 21 (L) 12/31/2022 0235   GLUCOSE 181 (H) 12/31/2022 0235   BUN 31 (H) 12/31/2022 0235   CREATININE 1.07 (H) 12/31/2022 0235   CREATININE 0.67 11/17/2019 1216   CALCIUM 8.6 (L) 12/31/2022 0235   GFRNONAA 55 (L) 12/31/2022 0235   GFRNONAA >60 11/17/2019 1216     Assessment/Plan: 2 Days Post-Op   Principal Problem:   Closed right hip fracture (HCC) Active Problems:   Thrombocytopenia (HCC)   Normocytic anemia   Type 2 diabetes mellitus without complication, with long-term current use of insulin (HCC)   Essential hypertension   Liver cirrhosis secondary to NASH (HCC)   Obesity (BMI 30-39.9)   Advance diet Up with therapy  Dispo: SNF, ready for discharge from ortho perspective     Weightbearing Status: WBAT RLE DVT Prophylaxis: Aspirin 81mg  BID   Dressings are waterproof ok to shower  Aspirin and pain medication rx  printed for discharge to SNF   Follow up in the office in 2 weeks for staple removal and xrays  [name] 12/31/2022, 4:00 PM  Dion Saucier PA-C  Physician Assistant with Dr. Rebekah [place] [facility]

## 2023-01-01 DIAGNOSIS — S72001A Fracture of unspecified part of neck of right femur, initial encounter for closed fracture: Secondary | ICD-10-CM | POA: Diagnosis not present

## 2023-01-01 LAB — GLUCOSE, CAPILLARY
Glucose-Capillary: 249 mg/dL — ABNORMAL HIGH (ref 70–99)
Glucose-Capillary: 332 mg/dL — ABNORMAL HIGH (ref 70–99)
Glucose-Capillary: 376 mg/dL — ABNORMAL HIGH (ref 70–99)
Glucose-Capillary: 343 mg/dL — ABNORMAL HIGH (ref 70–99)

## 2023-01-01 LAB — URINALYSIS, ROUTINE W REFLEX MICROSCOPIC
Bacteria, UA: NONE SEEN
Bilirubin Urine: NEGATIVE
Glucose, UA: >=500 mg/dL — AB
Hgb urine dipstick: NEGATIVE
Ketones, ur: NEGATIVE mg/dL
Leukocytes,Ua: NEGATIVE
Nitrite: NEGATIVE
Protein, ur: NEGATIVE mg/dL
Specific Gravity, Urine: 1.012 (ref 1.005–1.030)
pH: 5 (ref 5.0–8.0)

## 2023-01-01 LAB — BASIC METABOLIC PANEL
Anion gap: 8 (ref 5–15)
Anion gap: 9 (ref 5–15)
BUN: 36 mg/dL — ABNORMAL HIGH (ref 8–23)
BUN: 35 mg/dL — ABNORMAL HIGH (ref 8–23)
CO2: 22 mmol/L (ref 22–32)
CO2: 21 mmol/L — ABNORMAL LOW (ref 22–32)
Calcium: 8.6 mg/dL — ABNORMAL LOW (ref 8.9–10.3)
Calcium: 8.5 mg/dL — ABNORMAL LOW (ref 8.9–10.3)
Chloride: 101 mmol/L (ref 98–111)
Chloride: 102 mmol/L (ref 98–111)
Creatinine, Ser: 1.27 mg/dL — ABNORMAL HIGH (ref 0.44–1.00)
Creatinine, Ser: 1.04 mg/dL — ABNORMAL HIGH (ref 0.44–1.00)
GFR, Estimated: 45 mL/min — ABNORMAL LOW (ref >60)
GFR, Estimated: 57 mL/min — ABNORMAL LOW (ref >60)
Glucose, Bld: 326 mg/dL — ABNORMAL HIGH (ref 70–99)
Glucose, Bld: 314 mg/dL — ABNORMAL HIGH (ref 70–99)
Potassium: 5.4 mmol/L — ABNORMAL HIGH (ref 3.5–5.1)
Potassium: 5 mmol/L (ref 3.5–5.1)
Sodium: 131 mmol/L — ABNORMAL LOW (ref 135–145)
Sodium: 132 mmol/L — ABNORMAL LOW (ref 135–145)

## 2023-01-01 MED ORDER — SODIUM CHLORIDE 0.9 % IV BOLUS
500.0000 mL | Freq: Once | INTRAVENOUS | Status: AC
  Administered 2023-01-01: 500 mL via INTRAVENOUS

## 2023-01-01 MED ORDER — SODIUM CHLORIDE 0.9 % IV BOLUS: 250.0000 mL | Freq: Once | INTRAVENOUS | Status: DC

## 2023-01-01 MED ORDER — INSULIN GLARGINE-YFGN 100 UNIT/ML ~~LOC~~ SOLN
60.0000 [IU] | Freq: Every day | SUBCUTANEOUS | Status: DC
  Administered 2023-01-01 – 2023-01-02 (×2): 60 [IU] via SUBCUTANEOUS
  Filled 2023-01-01 (×2): qty 0.6

## 2023-01-01 NOTE — Plan of Care (Signed)
  Problem: Education: Goal: Knowledge of General Education information will improve Description: Including pain rating scale, medication(s)/side effects and non-pharmacologic comfort measures Outcome: Progressing   Problem: Clinical Measurements: Goal: Will remain free from infection Outcome: Progressing   Problem: Activity: Goal: Risk for activity intolerance will decrease Outcome: Progressing   Problem: Nutrition: Goal: Adequate nutrition will be maintained Outcome: Progressing   Problem: Coping: Goal: Level of anxiety will decrease Outcome: Progressing   Problem: Pain Managment: Goal: General experience of comfort will improve Outcome: Progressing   Problem: Safety: Goal: Ability to remain free from injury will improve Outcome: Progressing   

## 2023-01-01 NOTE — Progress Notes (Signed)
Physical Therapy Treatment Patient Details Name: Denise Klein MRN: [id number] DOB: Oct 13, 1950 Today's Date: 01/01/2023   History of Present Illness Patient is a 72 y/o female s/p Right femur IM nailing after sustaining a right hip fx due to a mechanical fall.    PT Comments    Patient is agreeable to PT after coordinating session around pain medication. Multiple supportive family members in the room. Patient was able to stand x 2 bouts. Gait training initiated with Min A using rolling walker with cues for sequencing. Patient seated in the chair at end of session. Patient is making progress with functional independence and increased activity tolerance this session. Recommend to continue PT to maximize independence and decrease caregiver burden. Anticipate the need for frequent assistance initially with continued PT recommended after this hospital stay.    Recommendations for follow up therapy are one component of a multi-disciplinary discharge planning process, led by the attending physician.  Recommendations may be updated based on patient status, additional functional criteria and insurance authorization.  Follow Up Recommendations  Can patient physically be transported by private vehicle: No    Assistance Recommended at Discharge Frequent or constant Supervision/Assistance  Patient can return home with the following A lot of help with walking and/or transfers;A lot of help with bathing/dressing/bathroom;Assist for transportation   Equipment Recommendations  Rolling walker (2 wheels);BSC/3in1    Recommendations for Other Services       Precautions / Restrictions Precautions Precautions: Fall Restrictions Weight Bearing Restrictions: Yes RLE Weight Bearing: Weight bearing as tolerated     Mobility  Bed Mobility Overal bed mobility: Needs Assistance Bed Mobility: Supine to Sit Rolling: Max assist         General bed mobility comments: verbal cues for technique to  increase independence with mobility. assistance for RLE and trunk support    Transfers Overall transfer level: Needs assistance Equipment used: Rolling walker (2 wheels) Transfers: Sit to/from Stand Sit to Stand: Min assist, Mod assist           General transfer comment: verbal cues for hand placement during transfers. 2 bouts of standing performed. daughter performed pericare while patient standing    Ambulation/Gait Ambulation/Gait assistance: Min Chemical engineer (Feet): 3 Feet Assistive device: Rolling walker (2 wheels) Gait Pattern/deviations: Step-to pattern, Decreased step length - right, Decreased stance time - right Gait velocity: decreased     General Gait Details: verbal cues for rolling walker and BLE sequencing. assistance for negotiation of rolling walker. occasional tactile cues for initiation of steps   Stairs             Wheelchair Mobility    Modified Rankin (Stroke Patients Only)       Balance Overall balance assessment: Needs assistance Sitting-balance support: Feet supported, Bilateral upper extremity supported Sitting balance-Leahy Scale: Fair     Standing balance support: Bilateral upper extremity supported, During functional activity, Reliant on assistive device for balance Standing balance-Leahy Scale: Poor Standing balance comment: relying on rolling walker for support. at least Min guard provided for safety                            Cognition Arousal/Alertness: Awake/alert Behavior During Therapy: Anxious Overall Cognitive Status: Impaired/Different from baseline Area of Impairment: Attention, Memory, Following commands, Safety/judgement, Awareness, Problem solving                   Current Attention Level: Sustained Memory: Decreased short-term memory  Following Commands: Follows one step commands with increased time Safety/Judgement: Decreased awareness of safety, Decreased awareness of  deficits Awareness: Emergent Problem Solving: Slow processing, Decreased initiation, Difficulty sequencing, Requires verbal cues, Requires tactile cues General Comments: patient also fearful of falling and needs reassurance and encouragement        Exercises      General Comments        Pertinent Vitals/Pain Pain Assessment Pain Assessment: Faces Faces Pain Scale: Hurts little more Pain Location: right leg/hip during movement and mobility Pain Descriptors / Indicators: Discomfort Pain Intervention(s): Limited activity within patient's tolerance, Monitored during session, Premedicated before session, Repositioned    Home Living                          Prior Function            PT Goals (current goals can now be found in the care plan section) Acute Rehab PT Goals Patient Stated Goal: to get better PT Goal Formulation: With family Time For Goal Achievement: 01/09/23 Potential to Achieve Goals: Fair Progress towards PT goals: Progressing toward goals    Frequency    Min 3X/week      PT Plan Current plan remains appropriate    Co-evaluation              AM-PAC PT "6 Clicks" Mobility   Outcome Measure  Help needed turning from your back to your side while in a flat bed without using bedrails?: A Little Help needed moving from lying on your back to sitting on the side of a flat bed without using bedrails?: A Lot Help needed moving to and from a bed to a chair (including a wheelchair)?: A Lot Help needed standing up from a chair using your arms (e.g., wheelchair or bedside chair)?: A Lot Help needed to walk in hospital room?: A Little Help needed climbing 3-5 steps with a railing? : Total 6 Click Score: 13    End of Session Equipment Utilized During Treatment: Gait belt Activity Tolerance: Patient tolerated treatment well Patient left: in chair;with call bell/phone within reach;with family/visitor present (multiple family members in the  room) Nurse Communication: Mobility status PT Visit Diagnosis: Unsteadiness on feet (R26.81);Other abnormalities of gait and mobility (R26.89);Muscle weakness (generalized) (M62.81);History of falling (Z91.81);Difficulty in walking, not elsewhere classified (R26.2);Pain Pain - Right/Left: Right Pain - part of body: Hip     Time: 0981-1914 PT Time Calculation (min) (ACUTE ONLY): 31 min  Charges:  $Gait Training: 8-22 mins $Therapeutic Activity: 8-22 mins                     Donna Bernard, PT, MPT    Ina Homes 01/01/2023, 12:50 PM

## 2023-01-01 NOTE — TOC Progression Note (Signed)
Transition of Care [facility]) - Progression Note    Patient Details  Name: Denise Klein MRN: [id number] Date of Birth: 03-03-1951  Transition of Care [facility]) CM/SW Contact  Lorri Frederick, LCSW Phone Number: 01/01/2023, 8:29 AM  Clinical Narrative:   [facility] notified that pt will not DC today.     Expected Discharge Plan: Skilled Nursing Facility Barriers to Discharge: Continued Medical Work up, SNF Pending bed offer  Expected Discharge Plan and Services In-house Referral: Clinical Social Work   Post Acute Care Choice: Skilled Nursing Facility Living arrangements for the past 2 months: Single Family Home                                       Social Determinants of Health (SDOH) Interventions SDOH Screenings   Tobacco Use: Low Risk  (12/30/2022)    Readmission Risk Interventions     No data to display

## 2023-01-01 NOTE — Inpatient Diabetes Management (Addendum)
Inpatient Diabetes Program Recommendations  AACE/ADA: New Consensus Statement on Inpatient Glycemic Control (2015)  Target Ranges:  Prepandial:   less than 140 mg/dL      Peak postprandial:   less than 180 mg/dL (1-2 hours)      Critically ill patients:  140 - 180 mg/dL   Lab Results  Component Value Date   GLUCAP 249 (H) 01/01/2023   HGBA1C 6.5 (H) 12/30/2022    Review of Glycemic Control  Latest Reference Range & Units 12/31/22 08:00 12/31/22 12:01 12/31/22 16:22 12/31/22 20:52 01/01/23 07:17  Glucose-Capillary 70 - 99 mg/dL 161 (H) 096 (H) 045 (H) 391 (H) 249 (H)  (H): Data is abnormally high  Diabetes history: DM2 Outpatient Diabetes medications: Tresiba 170 units QD, Metformin 500 mg QD Current orders for Inpatient glycemic control: Semglee 60 units QD (increased today), Novolog units 0-15 TID and 0-5 units QHS  Inpatient Diabetes Program Recommendations:    Might consider:  Novolog 3 units TID with meals if she consumes at least 50% if postprandials remain elevated  Will continue to follow while inpatient.  Thank you, Dulce Sellar, MSN, CDCES Diabetes Coordinator Inpatient Diabetes Program [phone] (team pager from 8a-5p)

## 2023-01-01 NOTE — Progress Notes (Signed)
OT Cancellation Note  Patient Details Name: STEFFI BARSNESS MRN: [id number] DOB: July 08, 1951   Cancelled Treatment:    Reason Eval/Treat Not Completed: Pain limiting ability to participate. Nursing notified of pt's request for pain medicine.  Raynald Kemp, OT Acute Rehabilitation Services Office: [phone]   Pilar Grammes 01/01/2023, 10:02 AM

## 2023-01-01 NOTE — Progress Notes (Signed)
PROGRESS NOTE    Denise Klein  HYQ:[id number] DOB: 04-24-1951 DOA: 12/29/2022 PCP: Lupita Raider, MD   Brief Narrative: Denise Klein is a 72 y.o. female with a history of diabetes mellitus type 2, hypertension, thrombocytopenia, cirrhosis, obesity.  Patient presented secondary to a fall and subsequent right hip pain and found to have a right hip fracture.  Orthopedic surgery was consulted and performed IM nail placement on 5/20.  Patient is weightbearing as tolerated with recommendations for SNF on discharge.   Assessment and Plan:  Right hip fracture Secondary to fall with subsequent right hip pain, found to have right hip fracture.  Orthopedic surgery consulted on admission and performed IM implant.  Orthopedic surgery recommendation for weightbearing as tolerated of right lower extremity, aspirin 81 mg twice daily, okay to shower with dressing.  PT/OT recommending SNF on discharge.  Diabetes mellitus type 2 Hemoglobin A1C of 6.5% which is well controlled for age. Patient is managed on insulin degludec on NSTEMI units daily in addition to metformin XR 500 mg daily.  Patient started on insulin glargine 40 units daily on admission in addition to SSI.  Complicated by 1 dose of dexamethasone. -Increase to insulin glargine 60 units daily -Continue sliding scale insulin  Hyperkalemia Possibly secondary to kidney dysfunction but also could be related to spironolactone. -Hold spironolactone  Primary hypertension Patient is on olmesartan-hydrochlorothiazide, spironolactone, metoprolol as an outpatient. -Continue metoprolol -Hold ARB and hydrochlorothiazide for now -Holding spironolactone secondary to hyperkalemia and worsening renal function   Elevated BUN No evidence of bleeding at this time.  Possibly related to mild kidney impairment.  Fluid status difficult to ascertain but patient appears to be euvolemic. -Hold spironolactone and lasix -Repeat BMP in a.m.  NASH  cirrhosis Stable. No evidence of fluid overload. Ammonia slightly elevated at 39; patient has some confusion but family states this is close to baseline. -Lactulose 10 g daily -Hold spironolactone and furosemide -2 gram sodium diet  Depressed mood Noticed by family. Patient with loss in family one year prior. Recommend outpatient follow-up with PCP with consideration of psychotherapy vs medication management.  Thrombocytopenia Chronic and stable.  No evidence of bleeding at this time.  Obesity Estimated body mass index is 31.07 kg/m as calculated from the following:   Height as of this encounter: 5\' 4"  (1.626 m).   Weight as of this encounter: 82.1 kg.   DVT prophylaxis: Aspirin 81 BID per orthopedic surgery Code Status:   Code Status: Full Code Family Communication: Daughter-in-law at baseline Disposition Plan: Discharge to SNF likely in AM if renal function remains stable   Consultants:  Orthopedic surgery  Procedures:  5/20: Treatment of intertrochanteric, pertrochanteric, subtrochanteric fracture with intramedullary implant  Antimicrobials: None   Subjective: No issues overnight or this morning.  Objective: BP (!) 146/48 (BP Location: Left Arm)   Pulse 73   Temp 98.1 F (36.7 C) (Oral)   Resp 17   Ht 5\' 4"  (1.626 m)   Wt 82.1 kg   SpO2 98%   BMI 31.07 kg/m   Examination:  General exam: Appears calm and comfortable Respiratory system: Clear to auscultation. Respiratory effort normal. Cardiovascular system: S1 & S2 heard, RRR. Gastrointestinal system: Abdomen is nondistended, soft and nontender. Normal bowel sounds heard. Central nervous system: Alert and oriented. No focal neurological deficits. Musculoskeletal: No LE edema. No calf tenderness   Data Reviewed: I have personally reviewed following labs and imaging studies  CBC Lab Results  Component Value Date   WBC  11.7 (H) 12/31/2022   RBC 2.94 (L) 12/31/2022   HGB 9.1 (L) 12/31/2022   HCT 26.1  (L) 12/31/2022   MCV 88.8 12/31/2022   MCH 31.0 12/31/2022   PLT 86 (L) 12/31/2022   MCHC 34.9 12/31/2022   RDW 14.9 12/31/2022   LYMPHSABS 1.4 12/29/2022   MONOABS 0.4 12/29/2022   EOSABS 0.3 12/29/2022   BASOSABS 0.0 12/29/2022     Last metabolic panel Lab Results  Component Value Date   NA 131 (L) 01/01/2023   K 5.4 (H) 01/01/2023   CL 101 01/01/2023   CO2 22 01/01/2023   BUN 36 (H) 01/01/2023   CREATININE 1.27 (H) 01/01/2023   GLUCOSE 326 (H) 01/01/2023   GFRNONAA 45 (L) 01/01/2023   GFRAA >60 11/17/2019   CALCIUM 8.6 (L) 01/01/2023   PROT 6.5 11/17/2019   ALBUMIN 3.5 11/17/2019   BILITOT 1.3 (H) 11/17/2019   ALKPHOS 84 11/17/2019   AST 35 11/17/2019   ALT 35 11/17/2019   ANIONGAP 8 01/01/2023    GFR: Estimated Creatinine Clearance: 41.5 mL/min (A) (by C-G formula based on SCr of 1.27 mg/dL (H)).  Recent Results (from the past 240 hour(s))  Surgical pcr screen     Status: None   Collection Time: 12/29/22  1:47 PM   Specimen: Nasal Mucosa; Nasal Swab  Result Value Ref Range Status   MRSA, PCR NEGATIVE NEGATIVE Final   Staphylococcus aureus NEGATIVE NEGATIVE Final    Comment: (NOTE) The Xpert SA Assay (FDA approved for NASAL specimens in patients 29 years of age and older), is one component of a comprehensive surveillance program. It is not intended to diagnose infection nor to guide or monitor treatment. Performed at [facility], [address], [place], [place] [address]       Radiology Studies: No results found.    LOS: 3 days    Jacquelin Hawking, MD [facility] 01/01/2023, 9:21 AM   If 7PM-7AM, please contact night-coverage [email or web address]

## 2023-01-01 NOTE — Care Management Important Message (Signed)
Important Message  Patient Details  Name: CHESLEA BREYER MRN: [id number] Date of Birth: 1950-11-10   Medicare Important Message Given:  Yes     Sherilyn Banker 01/01/2023, 3:42 PM

## 2023-01-02 ENCOUNTER — Encounter (HOSPITAL_COMMUNITY): Payer: Self-pay | Admitting: Orthopedic Surgery

## 2023-01-02 ENCOUNTER — Other Ambulatory Visit (HOSPITAL_COMMUNITY): Payer: Self-pay

## 2023-01-02 DIAGNOSIS — Z4889 Encounter for other specified surgical aftercare: Secondary | ICD-10-CM | POA: Diagnosis not present

## 2023-01-02 DIAGNOSIS — Z7401 Bed confinement status: Secondary | ICD-10-CM | POA: Diagnosis not present

## 2023-01-02 DIAGNOSIS — K5900 Constipation, unspecified: Secondary | ICD-10-CM | POA: Diagnosis not present

## 2023-01-02 DIAGNOSIS — R739 Hyperglycemia, unspecified: Secondary | ICD-10-CM | POA: Diagnosis not present

## 2023-01-02 DIAGNOSIS — E1169 Type 2 diabetes mellitus with other specified complication: Secondary | ICD-10-CM | POA: Diagnosis not present

## 2023-01-02 DIAGNOSIS — I10 Essential (primary) hypertension: Secondary | ICD-10-CM | POA: Diagnosis not present

## 2023-01-02 DIAGNOSIS — S72001A Fracture of unspecified part of neck of right femur, initial encounter for closed fracture: Secondary | ICD-10-CM | POA: Diagnosis not present

## 2023-01-02 DIAGNOSIS — E119 Type 2 diabetes mellitus without complications: Secondary | ICD-10-CM | POA: Diagnosis not present

## 2023-01-02 DIAGNOSIS — Z794 Long term (current) use of insulin: Secondary | ICD-10-CM | POA: Diagnosis not present

## 2023-01-02 DIAGNOSIS — S72141A Displaced intertrochanteric fracture of right femur, initial encounter for closed fracture: Secondary | ICD-10-CM | POA: Diagnosis not present

## 2023-01-02 DIAGNOSIS — S72001D Fracture of unspecified part of neck of right femur, subsequent encounter for closed fracture with routine healing: Secondary | ICD-10-CM | POA: Diagnosis not present

## 2023-01-02 DIAGNOSIS — D649 Anemia, unspecified: Secondary | ICD-10-CM | POA: Diagnosis not present

## 2023-01-02 DIAGNOSIS — Z4789 Encounter for other orthopedic aftercare: Secondary | ICD-10-CM | POA: Diagnosis not present

## 2023-01-02 DIAGNOSIS — M25551 Pain in right hip: Secondary | ICD-10-CM | POA: Diagnosis not present

## 2023-01-02 DIAGNOSIS — S72141D Displaced intertrochanteric fracture of right femur, subsequent encounter for closed fracture with routine healing: Secondary | ICD-10-CM | POA: Diagnosis not present

## 2023-01-02 LAB — GLUCOSE, CAPILLARY
Glucose-Capillary: 175 mg/dL — ABNORMAL HIGH (ref 70–99)
Glucose-Capillary: 294 mg/dL — ABNORMAL HIGH (ref 70–99)
Glucose-Capillary: 270 mg/dL — ABNORMAL HIGH (ref 70–99)

## 2023-01-02 LAB — BASIC METABOLIC PANEL
Anion gap: 5 (ref 5–15)
BUN: 33 mg/dL — ABNORMAL HIGH (ref 8–23)
CO2: 22 mmol/L (ref 22–32)
Calcium: 8.6 mg/dL — ABNORMAL LOW (ref 8.9–10.3)
Chloride: 106 mmol/L (ref 98–111)
Creatinine, Ser: 0.89 mg/dL (ref 0.44–1.00)
GFR, Estimated: >60 mL/min (ref >60)
Glucose, Bld: 219 mg/dL — ABNORMAL HIGH (ref 70–99)
Potassium: 5.3 mmol/L — ABNORMAL HIGH (ref 3.5–5.1)
Sodium: 133 mmol/L — ABNORMAL LOW (ref 135–145)

## 2023-01-02 LAB — POTASSIUM: Potassium: 4.6 mmol/L (ref 3.5–5.1)

## 2023-01-02 MED ORDER — LACTULOSE 10 GM/15ML PO SOLN: 10.0000 g | Freq: Two times a day (BID) | ORAL | 0 refills | Status: DC

## 2023-01-02 MED ORDER — HYDROCHLOROTHIAZIDE 25 MG PO TABS
25.0000 mg | ORAL_TABLET | Freq: Every day | ORAL | Status: DC
  Administered 2023-01-02: 25 mg via ORAL
  Filled 2023-01-02: qty 1

## 2023-01-02 MED ORDER — HYDROCHLOROTHIAZIDE 25 MG PO TABS: 25.0000 mg | ORAL_TABLET | Freq: Every day | ORAL | Status: DC

## 2023-01-02 MED ORDER — LACTULOSE 10 GM/15ML PO SOLN: 10.0000 g | Freq: Two times a day (BID) | ORAL | Status: DC

## 2023-01-02 MED ORDER — INSULIN ASPART 100 UNIT/ML IJ SOLN: 10.0000 [IU] | Freq: Three times a day (TID) | INTRAMUSCULAR | Status: DC

## 2023-01-02 MED ORDER — SODIUM ZIRCONIUM CYCLOSILICATE 10 G PO PACK
10.0000 g | PACK | Freq: Once | ORAL | Status: AC
  Administered 2023-01-02: 10 g via ORAL
  Filled 2023-01-02: qty 1

## 2023-01-02 MED ORDER — LIVING WELL WITH DIABETES BOOK
Freq: Once | Status: AC
  Filled 2023-01-02 (×2): qty 1

## 2023-01-02 MED ORDER — ENSURE ENLIVE PO LIQD: 237.0000 mL | Freq: Two times a day (BID) | ORAL | Status: AC

## 2023-01-02 MED ORDER — METOPROLOL SUCCINATE ER 100 MG PO TB24: 100.0000 mg | ORAL_TABLET | Freq: Every day | ORAL | Status: AC

## 2023-01-02 MED ORDER — TRESIBA FLEXTOUCH 100 UNIT/ML ~~LOC~~ SOPN: 60.0000 [IU] | PEN_INJECTOR | Freq: Every day | SUBCUTANEOUS | Status: DC

## 2023-01-02 MED ORDER — FUROSEMIDE 10 MG/ML IJ SOLN
20.0000 mg | Freq: Once | INTRAMUSCULAR | Status: AC
  Administered 2023-01-02: 20 mg via INTRAVENOUS
  Filled 2023-01-02: qty 2

## 2023-01-02 NOTE — Plan of Care (Signed)
  Problem: Education: Goal: Ability to describe self-care measures that may prevent or decrease complications (Diabetes Survival Skills Education) will improve Outcome: Not Progressing Goal: Individualized Educational Video(s) Outcome: Not Progressing   Problem: Coping: Goal: Ability to adjust to condition or change in health will improve Outcome: Not Progressing   Problem: Fluid Volume: Goal: Ability to maintain a balanced intake and output will improve Outcome: Not Progressing   Problem: Health Behavior/Discharge Planning: Goal: Ability to identify and utilize available resources and services will improve Outcome: Not Progressing Goal: Ability to manage health-related needs will improve Outcome: Not Progressing   Problem: Metabolic: Goal: Ability to maintain appropriate glucose levels will improve Outcome: Not Progressing   Problem: Nutritional: Goal: Maintenance of adequate nutrition will improve Outcome: Not Progressing Goal: Progress toward achieving an optimal weight will improve Outcome: Not Progressing   Problem: Skin Integrity: Goal: Risk for impaired skin integrity will decrease Outcome: Not Progressing   

## 2023-01-02 NOTE — TOC Transition Note (Signed)
Transition of Care [facility]) - CM/SW Discharge Note   Patient Details  Name: Denise Klein MRN: [id number] Date of Birth: 1951-03-02  Transition of Care [facility]) CM/SW Contact:  Lorri Frederick, LCSW Phone Number: 01/02/2023, 2:45 PM   Clinical Narrative:   Pt is discharging to [facility].  RN call report to [phone].    Final next level of care: Skilled Nursing Facility Barriers to Discharge: Barriers Resolved   Patient Goals and CMS Choice CMS Medicare.gov Compare Post Acute Care list provided to:: Patient Represenative (must comment) (husband, son, daughter in law) Choice offered to / list presented to : Spouse  Discharge Placement                Patient chooses bed at: [facility] [place] Patient to be transferred to facility by: [facility] Name of family member notified: pt niece in room, will tell husband Patient and family notified of of transfer: 01/02/23  Discharge Plan and Services Additional resources added to the After Visit Summary for   In-house Referral: Clinical Social Work   Post Acute Care Choice: Skilled Nursing Facility                               Social Determinants of Health (SDOH) Interventions SDOH Screenings   Tobacco Use: Low Risk  (01/02/2023)     Readmission Risk Interventions     No data to display

## 2023-01-02 NOTE — Progress Notes (Signed)
Mobility Specialist Progress Note   01/02/23 1150  Mobility  Activity Transferred to/from [facility]  Level of Assistance Minimal assist, patient does 75% or more  Assistive Device Front wheel walker  Distance Ambulated (ft) 3 ft  Range of Motion/Exercises Active;All extremities  RLE Weight Bearing WBAT  Activity Response Tolerated well   Patient received in supine and agreeable to participate. Presents A&Ox4, but with cognitive delay and needing extra time for processing. Required mod A for bed mobility, more so assisting RLE from supine to sitting. Stood and took slow steps along bedside to [facility] with min A + verbal cues for navigation. Tolerated without complaint or incident. Was left on [facility] per patient request with all needs met, call bell in reach.   [place] Samhita Kretsch, BS EXP Mobility Specialist Please contact via SecureChat or Rehab office at [phone]

## 2023-01-02 NOTE — Inpatient Diabetes Management (Addendum)
Inpatient Diabetes Program Recommendations  AACE/ADA: New Consensus Statement on Inpatient Glycemic Control (2015)  Target Ranges:  Prepandial:   less than 140 mg/dL      Peak postprandial:   less than 180 mg/dL (1-2 hours)      Critically ill patients:  140 - 180 mg/dL   Lab Results  Component Value Date   GLUCAP 175 (H) 01/02/2023   HGBA1C 6.5 (H) 12/30/2022    Review of Glycemic Control   Latest Reference Range & Units 01/01/23 07:17 01/01/23 11:13 01/01/23 15:55 01/01/23 21:40 01/02/23 07:50  Glucose-Capillary 70 - 99 mg/dL 213 (H) 086 (H) 578 (H) 343 (H) 175 (H)  (H): Data is abnormally high  Diabetes history: DM2 Outpatient Diabetes medications: Tresiba 170 units QD, Metformin 500 mg QD Current orders for Inpatient glycemic control: Semglee 60 units QD, Novolog units 0-15 TID and 0-5 units QHS   Inpatient Diabetes Program Recommendations:     Might consider:   Novolog 4 units TID with meals if she consumes at least 50% as PP are elevated >300 mg/dL.    Spoke with patient and husband at bedside. She is very hard of hearing. She confirms above home medications.  She has recently decreased her Tresiba to 165 units QAM.  Husband states she wakes up every morning with hypoglycemia in the 50's.  She treats with juice.  She see's Dr. Clelia Croft for PCP.  Explained to patient and spouse, this is too much basal insulin.  She has never taken rapid insulin before.  This dose of 170 units is covering her meals as well.  I recommend discharging her on a safer regimen (basal/bolus).    Dr. Mal Misty in to speak with her.  Will follow up with her after.    Addendum@1133 :  Explained basal/bolus insulin.  Asked her and her husband to discuss with Dr. Clelia Croft.  Also educated them on CGM's.  I feel she would benefit from a [employer] 3 as this will alert her when she is low.  Benefit check reveals $0 co-pay for the Freestyle Libre 3 and Dexcom G7.  Novolog flex pen is $35.    If appropriate, please  consider Ensure Max (4 grams of carbohydrates) instead of Ensure Enlive which has 45 grams of carbohydrates.    Educated on The Plate Method, CHO's, portion control, CBGs at home fasting and mid afternoon, F/U with PCP every 3 months, bring meter to PCP office, long and short term complications of uncontrolled BG, and importance of exercise.  Ordered Living Well with Diabetes booklet.    Will continue to follow while inpatient.   Thank you, Dulce Sellar, MSN, CDCES Diabetes Coordinator Inpatient Diabetes Program [phone] (team pager from 8a-5p)

## 2023-01-02 NOTE — TOC Benefit Eligibility Note (Signed)
Patient Product/process development scientist completed.    The patient is currently admitted and upon discharge could be taking Novolog Flex Pen.  The current 30 day co-pay is $35.00.   The patient is currently admitted and upon discharge could be taking Freestyle [employer].  The current 30 day co-pay is $0.00.   The patient is currently admitted and upon discharge could be taking Dexcom G7 Sensors.  The current 30 day co-pay is $0.00.   The patient is insured through [employer] Part D   This test claim was processed through [facility]- copay amounts may vary at other pharmacies due to pharmacy/plan contracts, or as the patient moves through the different stages of their insurance plan.  Roland Earl, CPHT Pharmacy Patient Advocate Specialist [facility] Patient Advocate Team Direct Number: [phone]  Fax: [phone]

## 2023-01-02 NOTE — Discharge Summary (Signed)
Physician Discharge Summary   Patient: Denise Klein MRN: [id number] DOB: 1951/05/17  Admit date:     12/29/2022  Discharge date: 01/02/23  Discharge Physician: Jacquelin Hawking, MD   PCP: Lupita Raider, MD   Recommendations at discharge:  PCP follow-up for Klein follow-up Hold spironolactone and Lasix pending stable kidney function and potassium Titrate insulin as needed for good blood sugar control without glycemia  Discharge Diagnoses: Principal Problem:   Closed right hip fracture Denise Klein) Active Problems:   Normocytic anemia   Type 2 diabetes mellitus without complication, with long-term current use of insulin (HCC)   Essential hypertension   Liver cirrhosis secondary to NASH (HCC)   Thrombocytopenia (HCC)   Obesity (BMI 30-39.9)  Resolved Problems:   * No resolved Klein problems. [facility] Course: Denise Klein is a 72 y.o. female with a history of diabetes mellitus type 2, hypertension, thrombocytopenia, cirrhosis, obesity.  Patient presented secondary to a fall and subsequent right hip pain and found to have a right hip fracture.  Orthopedic surgery was consulted and performed IM nail placement on 5/20.  Patient is weightbearing as tolerated with recommendations for SNF on discharge.  Assessment and Plan:  Right hip fracture Secondary to fall with subsequent right hip pain, found to have right hip fracture.  Orthopedic surgery consulted on admission and performed IM implant.  Orthopedic surgery recommendation for weightbearing as tolerated of right lower extremity, aspirin 81 mg twice daily, okay to shower with dressing.  PT/OT recommending SNF on discharge.   Diabetes mellitus type 2 Hemoglobin A1C of 6.5% which is well controlled for age. Patient is managed on insulin degludec on NSTEMI units daily in addition to metformin XR 500 mg daily.  Patient started on insulin glargine 40 units daily on admission in addition to SSI.  Complicated by 1 dose of  dexamethasone. Recommendation to decrease to insulin degludec 60 units daily with the addition of Novolog 10 units TID with meals.   Hyperkalemia Possibly secondary to kidney dysfunction but also could be related to spironolactone. Potassium improved with holding spironolactone, in addition to a dose of Lasix and resumption of hydrochlorothiazide.  Potassium of 4.6 on day of discharge.  Recommend repeat BMP in 3 days prior to consideration of restarting spironolactone.   Primary hypertension Patient is on olmesartan-hydrochlorothiazide, spironolactone, metoprolol as an outpatient. Discontinue olmesartan and spironolactone. Continue hydrochlorothiazide and metoprolol.  Elevated BUN No evidence of bleeding at this time.  Possibly related to mild kidney impairment.  Fluid status difficult to ascertain but patient appears to be euvolemic. Improved with holding diuretics. Repeat BMP in 3 days.   NASH cirrhosis Stable. No evidence of fluid overload. Ammonia slightly elevated at 39; patient has some confusion but family states this is close to baseline. Discharge on lactulose 10 g BID and titrate for daily stools. Hold lasix and spironolactone until stable renal function and potassium levels. 2 gram sodium diet.  Commend restarting the lactone and Lasix at reduced dose once able.   Depressed mood Noticed by family. Patient with loss in family one year prior. Recommend outpatient follow-up with PCP with consideration of psychotherapy vs medication management.   Thrombocytopenia Chronic and stable.  No evidence of bleeding at this time.   Obesity Estimated body mass index is 31.07 kg/m as calculated from the following:   Height as of this encounter: 5\' 4"  (1.626 m).   Weight as of this encounter: 82.1 kg.  Consultants: Orthopedic surgery  Procedures performed:  5/20: Treatment of  intertrochanteric, pertrochanteric, subtrochanteric fracture with intramedullary implant    Disposition: Skilled  nursing facility Diet recommendation: 2 g sodium diet   DISCHARGE MEDICATION: Allergies as of 01/02/2023       Reactions   Sulfamethoxazole-trimethoprim Other (See Comments), Rash   Acetaminophen Other (See Comments)   Liver affected   Ibuprofen Other (See Comments)   Liver affected   Meloxicam Other (See Comments)   Molds & Smuts    Naproxen Sodium Other (See Comments)   Liver affected   Pentazocine Other (See Comments)   hallucinations   Propoxyphene Swelling   Statins Other (See Comments)   Lisinopril Other (See Comments), Nausea And Vomiting   Penicillin G Rash   Sulfamethoxazole Rash        Medication List     STOP taking these medications    furosemide 20 MG tablet Commonly known as: LASIX   spironolactone 50 MG tablet Commonly known as: ALDACTONE   telmisartan-hydrochlorothiazide 80-12.5 MG tablet Commonly known as: MICARDIS HCT       TAKE these medications    Accu-Chek Aviva Plus test strip Generic drug: glucose blood   amLODipine 2.5 MG tablet Commonly known as: NORVASC Take 2.5 mg by mouth daily.   aspirin EC 81 MG tablet Take 1 tablet (81 mg total) by mouth 2 (two) times daily. Swallow whole.   cetirizine 10 MG tablet Commonly known as: ZYRTEC Take 10 mg by mouth daily.   colesevelam 625 MG tablet Commonly known as: WELCHOL Take 3 tablets by mouth 2 (two) times daily.   feeding supplement Liqd Take 237 mLs by mouth 2 (two) times daily between meals.   hydrochlorothiazide 25 MG tablet Commonly known as: HYDRODIURIL Take 1 tablet (25 mg total) by mouth daily. Start taking on: Jan 03, 2023   insulin aspart 100 UNIT/ML injection Commonly known as: novoLOG Inject 10 Units into the skin 3 (three) times daily with meals.   lactulose 10 GM/15ML solution Commonly known as: CHRONULAC Take 15 mLs (10 g total) by mouth 2 (two) times daily.   metFORMIN 500 MG 24 hr tablet Commonly known as: GLUCOPHAGE-XR Take 500 mg by mouth daily with  breakfast.   metoprolol succinate 100 MG 24 hr tablet Commonly known as: TOPROL-XL Take 1 tablet (100 mg total) by mouth daily. What changed: when to take this   oxyCODONE 5 MG immediate release tablet Commonly known as: Roxicodone Take 1 tablet (5 mg total) by mouth every 6 (six) hours as needed for severe pain.   Evaristo Bury FlexTouch 100 UNIT/ML FlexTouch Pen Generic drug: insulin degludec Inject 60 Units into the skin daily. What changed: how much to take        Contact information for follow-up providers     Yolonda Kida, MD Follow up in 2 week(s).   Specialty: Orthopedic Surgery Why: For suture removal, For wound re-check Contact information: [address] [place] [address] [phone]              Contact information for after-discharge care     Destination     [facility], [place] Preferred SNF .   Service: Skilled Nursing Contact information: [address] [place] [address] [phone]                    Discharge Exam: BP (!) 140/45 (BP Location: Right Arm)   Pulse 60   Temp 97.8 F (36.6 C) (Oral) Comment: machine malfunction for original value , -IT  Resp 16   Ht 5\' 4"  (1.626 m)   Wt 82.1 kg   SpO2 98%   BMI 31.07 kg/m   General exam: Appears calm and comfortable Respiratory system: Clear to auscultation. Respiratory effort normal. Cardiovascular system: S1 & S2 heard, RRR. Gastrointestinal system: Abdomen is nondistended, soft and nontender. Normal bowel sounds heard. Central nervous system: Alert and oriented. No focal neurological deficits. Musculoskeletal: No edema. No calf tenderness Skin: No cyanosis. No rashes Psychiatry: Judgement and insight appear normal. Mood & affect appropriate.   Condition at discharge: stable  The results of significant diagnostics from this hospitalization (including imaging, microbiology, ancillary and laboratory) are listed  below for reference.   Imaging Studies: DG FEMUR, MIN 2 VIEWS RIGHT  Result Date: 12/29/2022 CLINICAL DATA:  Right hip fracture status post ORIF EXAM: RIGHT FEMUR 2 VIEWS COMPARISON:  12/29/2022 FINDINGS: 6 fluoroscopic images are obtained during the performance of the procedure and are provided for interpretation only. Images demonstrate inter trochanteric right hip fracture, with subsequent placement of an intramedullary rod with proximal dynamic and distal interlocking screw. Alignment is anatomic. Fluoroscopy time: 46 seconds, 9.31 mGy IMPRESSION: 1. ORIF of an intertrochanteric right hip fracture as above. Anatomic alignment. Electronically Signed   By: Sharlet Salina M.D.   On: 12/29/2022 17:09   DG C-Arm 1-60 Min-No Report  Result Date: 12/29/2022 Fluoroscopy was utilized by the requesting physician.  No radiographic interpretation.   DG Hip Unilat  With Pelvis 2-3 Views Right  Result Date: 12/29/2022 CLINICAL DATA:  Fall. EXAM: DG HIP (WITH OR WITHOUT PELVIS) 2-3V RIGHT COMPARISON:  None Available. FINDINGS: Moderately displaced and comminuted fracture is seen involving intertrochanteric portion of right proximal femur. IMPRESSION: Moderately displaced and comminuted intertrochanteric fracture of proximal right femur. Electronically Signed   By: Lupita Raider M.D.   On: 12/29/2022 11:34   DG Chest Port 1 View  Result Date: 12/29/2022 CLINICAL DATA:  Fall. EXAM: PORTABLE CHEST 1 VIEW COMPARISON:  May 07, 2022. FINDINGS: Stable cardiomediastinal silhouette. Both lungs are clear. The visualized skeletal structures are unremarkable. IMPRESSION: No active disease. Electronically Signed   By: Lupita Raider M.D.   On: 12/29/2022 11:32    Microbiology: Results for orders placed or performed during the Klein encounter of 12/29/22  Surgical pcr screen     Status: None   Collection Time: 12/29/22  1:47 PM   Specimen: Nasal Mucosa; Nasal Swab  Result Value Ref Range Status   MRSA,  PCR NEGATIVE NEGATIVE Final   Staphylococcus aureus NEGATIVE NEGATIVE Final    Comment: (NOTE) The Xpert SA Assay (FDA approved for NASAL specimens in patients 19 years of age and older), is one component of a comprehensive surveillance program. It is not intended to diagnose infection nor to guide or monitor treatment. Performed at [facility], [address] [place], [place] [address]     Labs: CBC: Recent Labs  Lab 12/29/22 1047 12/30/22 0805 12/31/22 0235  WBC 5.5 11.1* 11.7*  NEUTROABS 3.3  --   --   HGB 11.3* 10.3* 9.1*  HCT 33.9* 29.1* 26.1*  MCV 92.9 87.7 88.8  PLT 79* 105* 86*   Basic Metabolic Panel: Recent Labs  Lab 12/30/22 0805 12/31/22 0235 12/31/22 1506 01/01/23 0055 01/01/23 1026 01/02/23 0331 01/02/23 1223  NA 132* 129*  --  131* 132* 133*  --   K 4.8 5.3* 5.1 5.4* 5.0 5.3* 4.6  CL 103 102  --  101 102 106  --  CO2 20* 21*  --  22 21* 22  --   GLUCOSE 168* 181*  --  326* 314* 219*  --   BUN 24* 31*  --  36* 35* 33*  --   CREATININE 1.10* 1.07*  --  1.27* 1.04* 0.89  --   CALCIUM 8.7* 8.6*  --  8.6* 8.5* 8.6*  --    Liver Function Tests: No results for input(s): "AST", "ALT", "ALKPHOS", "BILITOT", "PROT", "ALBUMIN" in the last 168 hours. CBG: Recent Labs  Lab 01/01/23 1113 01/01/23 1555 01/01/23 2140 01/02/23 0750 01/02/23 1113  GLUCAP 332* 376* 343* 175* 294*    Discharge time spent: 35 minutes.  Signed: Jacquelin Hawking, MD [facility] 01/02/2023

## 2023-01-02 NOTE — Plan of Care (Addendum)
AVS Placed in Discharge Packet.  All Questions answered.  Patient excited for Discharge.  Report Called.

## 2023-01-11 ENCOUNTER — Other Ambulatory Visit: Payer: Self-pay

## 2023-01-11 ENCOUNTER — Inpatient Hospital Stay (HOSPITAL_COMMUNITY)
Admission: EM | Admit: 2023-01-11 | Discharge: 2023-01-14 | DRG: 441 | Disposition: A | Discharge status: Skilled Nursing Facility | Payer: Medicare PPO | Source: Skilled Nursing Facility | Attending: Internal Medicine | Admitting: Internal Medicine

## 2023-01-11 ENCOUNTER — Emergency Department (HOSPITAL_COMMUNITY): Payer: Medicare PPO

## 2023-01-11 DIAGNOSIS — E785 Hyperlipidemia, unspecified: Secondary | ICD-10-CM | POA: Diagnosis present

## 2023-01-11 DIAGNOSIS — R4182 Altered mental status, unspecified: Secondary | ICD-10-CM | POA: Diagnosis not present

## 2023-01-11 DIAGNOSIS — R4781 Slurred speech: Secondary | ICD-10-CM | POA: Diagnosis not present

## 2023-01-11 DIAGNOSIS — R0602 Shortness of breath: Secondary | ICD-10-CM | POA: Diagnosis not present

## 2023-01-11 DIAGNOSIS — G9341 Metabolic encephalopathy: Secondary | ICD-10-CM | POA: Diagnosis present

## 2023-01-11 DIAGNOSIS — Z888 Allergy status to other drugs, medicaments and biological substances status: Secondary | ICD-10-CM

## 2023-01-11 DIAGNOSIS — E871 Hypo-osmolality and hyponatremia: Secondary | ICD-10-CM | POA: Diagnosis not present

## 2023-01-11 DIAGNOSIS — Z90710 Acquired absence of both cervix and uterus: Secondary | ICD-10-CM

## 2023-01-11 DIAGNOSIS — K7581 Nonalcoholic steatohepatitis (NASH): Secondary | ICD-10-CM | POA: Diagnosis present

## 2023-01-11 DIAGNOSIS — W1830XA Fall on same level, unspecified, initial encounter: Secondary | ICD-10-CM | POA: Diagnosis present

## 2023-01-11 DIAGNOSIS — I10 Essential (primary) hypertension: Secondary | ICD-10-CM | POA: Diagnosis not present

## 2023-01-11 DIAGNOSIS — Z79899 Other long term (current) drug therapy: Secondary | ICD-10-CM

## 2023-01-11 DIAGNOSIS — Z8249 Family history of ischemic heart disease and other diseases of the circulatory system: Secondary | ICD-10-CM

## 2023-01-11 DIAGNOSIS — S72001D Fracture of unspecified part of neck of right femur, subsequent encounter for closed fracture with routine healing: Secondary | ICD-10-CM | POA: Diagnosis not present

## 2023-01-11 DIAGNOSIS — Z7984 Long term (current) use of oral hypoglycemic drugs: Secondary | ICD-10-CM | POA: Diagnosis not present

## 2023-01-11 DIAGNOSIS — I959 Hypotension, unspecified: Secondary | ICD-10-CM | POA: Diagnosis not present

## 2023-01-11 DIAGNOSIS — Z886 Allergy status to analgesic agent status: Secondary | ICD-10-CM | POA: Diagnosis not present

## 2023-01-11 DIAGNOSIS — D696 Thrombocytopenia, unspecified: Secondary | ICD-10-CM | POA: Diagnosis not present

## 2023-01-11 DIAGNOSIS — Z7982 Long term (current) use of aspirin: Secondary | ICD-10-CM

## 2023-01-11 DIAGNOSIS — S72001P Fracture of unspecified part of neck of right femur, subsequent encounter for closed fracture with malunion: Secondary | ICD-10-CM | POA: Diagnosis not present

## 2023-01-11 DIAGNOSIS — Z9181 History of falling: Secondary | ICD-10-CM | POA: Diagnosis not present

## 2023-01-11 DIAGNOSIS — Z96641 Presence of right artificial hip joint: Secondary | ICD-10-CM | POA: Diagnosis present

## 2023-01-11 DIAGNOSIS — E119 Type 2 diabetes mellitus without complications: Secondary | ICD-10-CM | POA: Diagnosis present

## 2023-01-11 DIAGNOSIS — Z6831 Body mass index (BMI) 31.0-31.9, adult: Secondary | ICD-10-CM

## 2023-01-11 DIAGNOSIS — D649 Anemia, unspecified: Secondary | ICD-10-CM | POA: Diagnosis not present

## 2023-01-11 DIAGNOSIS — S72001A Fracture of unspecified part of neck of right femur, initial encounter for closed fracture: Secondary | ICD-10-CM | POA: Diagnosis not present

## 2023-01-11 DIAGNOSIS — K7460 Unspecified cirrhosis of liver: Secondary | ICD-10-CM | POA: Diagnosis present

## 2023-01-11 DIAGNOSIS — S72141D Displaced intertrochanteric fracture of right femur, subsequent encounter for closed fracture with routine healing: Secondary | ICD-10-CM | POA: Diagnosis not present

## 2023-01-11 DIAGNOSIS — D638 Anemia in other chronic diseases classified elsewhere: Secondary | ICD-10-CM | POA: Diagnosis present

## 2023-01-11 DIAGNOSIS — S72141A Displaced intertrochanteric fracture of right femur, initial encounter for closed fracture: Secondary | ICD-10-CM | POA: Diagnosis not present

## 2023-01-11 DIAGNOSIS — Z794 Long term (current) use of insulin: Secondary | ICD-10-CM | POA: Diagnosis not present

## 2023-01-11 DIAGNOSIS — Z882 Allergy status to sulfonamides status: Secondary | ICD-10-CM | POA: Diagnosis not present

## 2023-01-11 DIAGNOSIS — E669 Obesity, unspecified: Secondary | ICD-10-CM | POA: Diagnosis not present

## 2023-01-11 DIAGNOSIS — R17 Unspecified jaundice: Secondary | ICD-10-CM | POA: Diagnosis not present

## 2023-01-11 DIAGNOSIS — D6959 Other secondary thrombocytopenia: Secondary | ICD-10-CM | POA: Diagnosis present

## 2023-01-11 DIAGNOSIS — K7682 Hepatic encephalopathy: Secondary | ICD-10-CM | POA: Diagnosis not present

## 2023-01-11 DIAGNOSIS — Z880 Allergy status to penicillin: Secondary | ICD-10-CM

## 2023-01-11 DIAGNOSIS — K5900 Constipation, unspecified: Secondary | ICD-10-CM | POA: Diagnosis present

## 2023-01-11 DIAGNOSIS — E1169 Type 2 diabetes mellitus with other specified complication: Secondary | ICD-10-CM | POA: Diagnosis not present

## 2023-01-11 DIAGNOSIS — Z833 Family history of diabetes mellitus: Secondary | ICD-10-CM | POA: Diagnosis not present

## 2023-01-11 DIAGNOSIS — R739 Hyperglycemia, unspecified: Secondary | ICD-10-CM | POA: Diagnosis not present

## 2023-01-11 DIAGNOSIS — R55 Syncope and collapse: Secondary | ICD-10-CM | POA: Diagnosis not present

## 2023-01-11 DIAGNOSIS — R531 Weakness: Secondary | ICD-10-CM | POA: Diagnosis not present

## 2023-01-11 DIAGNOSIS — Z7401 Bed confinement status: Secondary | ICD-10-CM | POA: Diagnosis not present

## 2023-01-11 DIAGNOSIS — M25551 Pain in right hip: Secondary | ICD-10-CM | POA: Diagnosis not present

## 2023-01-11 LAB — CBC WITH DIFFERENTIAL/PLATELET
Abs Immature Granulocytes: 0.03 10*3/uL (ref 0.00–0.07)
Basophils Absolute: 0 10*3/uL (ref 0.0–0.1)
Basophils Relative: 0 %
Eosinophils Absolute: 0.2 10*3/uL (ref 0.0–0.5)
Eosinophils Relative: 3 %
HCT: 27.7 % — ABNORMAL LOW (ref 36.0–46.0)
Hemoglobin: 9.3 g/dL — ABNORMAL LOW (ref 12.0–15.0)
Immature Granulocytes: 0 %
Lymphocytes Relative: 24 %
Lymphs Abs: 1.7 10*3/uL (ref 0.7–4.0)
MCH: 32.2 pg (ref 26.0–34.0)
MCHC: 33.6 g/dL (ref 30.0–36.0)
MCV: 95.8 fL (ref 80.0–100.0)
Monocytes Absolute: 0.7 10*3/uL (ref 0.1–1.0)
Monocytes Relative: 10 %
Neutro Abs: 4.3 10*3/uL (ref 1.7–7.7)
Neutrophils Relative %: 63 %
Platelets: 105 10*3/uL — ABNORMAL LOW (ref 150–400)
RBC: 2.89 MIL/uL — ABNORMAL LOW (ref 3.87–5.11)
RDW: 17.6 % — ABNORMAL HIGH (ref 11.5–15.5)
WBC: 6.9 10*3/uL (ref 4.0–10.5)
nRBC: 0 % (ref 0.0–0.2)

## 2023-01-11 LAB — COMPREHENSIVE METABOLIC PANEL
ALT: 28 U/L (ref 0–44)
AST: 35 U/L (ref 15–41)
Albumin: 2.5 g/dL — ABNORMAL LOW (ref 3.5–5.0)
Alkaline Phosphatase: 109 U/L (ref 38–126)
Anion gap: 8 (ref 5–15)
BUN: 17 mg/dL (ref 8–23)
CO2: 20 mmol/L — ABNORMAL LOW (ref 22–32)
Calcium: 8.7 mg/dL — ABNORMAL LOW (ref 8.9–10.3)
Chloride: 108 mmol/L (ref 98–111)
Creatinine, Ser: 0.74 mg/dL (ref 0.44–1.00)
GFR, Estimated: >60 mL/min (ref >60)
Glucose, Bld: 227 mg/dL — ABNORMAL HIGH (ref 70–99)
Potassium: 4.6 mmol/L (ref 3.5–5.1)
Sodium: 136 mmol/L (ref 135–145)
Total Bilirubin: 1.5 mg/dL — ABNORMAL HIGH (ref 0.3–1.2)
Total Protein: 5.6 g/dL — ABNORMAL LOW (ref 6.5–8.1)

## 2023-01-11 LAB — URINALYSIS, ROUTINE W REFLEX MICROSCOPIC
Bilirubin Urine: NEGATIVE
Glucose, UA: NEGATIVE mg/dL
Hgb urine dipstick: NEGATIVE
Ketones, ur: NEGATIVE mg/dL
Leukocytes,Ua: NEGATIVE
Nitrite: NEGATIVE
Protein, ur: NEGATIVE mg/dL
Specific Gravity, Urine: 1.014 (ref 1.005–1.030)
pH: 5 (ref 5.0–8.0)

## 2023-01-11 LAB — LIPASE, BLOOD: Lipase: 65 U/L — ABNORMAL HIGH (ref 11–51)

## 2023-01-11 LAB — AMMONIA: Ammonia: 94 umol/L — ABNORMAL HIGH (ref 9–35)

## 2023-01-11 LAB — TROPONIN I (HIGH SENSITIVITY)
Troponin I (High Sensitivity): 6 ng/L (ref <18)
Troponin I (High Sensitivity): 6 ng/L (ref <18)

## 2023-01-11 MED ORDER — ASPIRIN 81 MG PO TBEC
81.0000 mg | DELAYED_RELEASE_TABLET | Freq: Two times a day (BID) | ORAL | Status: DC
  Administered 2023-01-12 – 2023-01-14 (×5): 81 mg via ORAL
  Filled 2023-01-11 (×5): qty 1

## 2023-01-11 MED ORDER — LACTULOSE 10 GM/15ML PO SOLN
10.0000 g | Freq: Three times a day (TID) | ORAL | Status: DC
  Filled 2023-01-11: qty 15

## 2023-01-11 MED ORDER — INSULIN DEGLUDEC 100 UNIT/ML ~~LOC~~ SOPN: 60.0000 [IU] | PEN_INJECTOR | Freq: Every day | SUBCUTANEOUS | Status: DC

## 2023-01-11 MED ORDER — METOPROLOL SUCCINATE ER 100 MG PO TB24
100.0000 mg | ORAL_TABLET | Freq: Every day | ORAL | Status: DC
  Administered 2023-01-12 – 2023-01-14 (×3): 100 mg via ORAL
  Filled 2023-01-11 (×3): qty 1

## 2023-01-11 MED ORDER — AMLODIPINE BESYLATE 5 MG PO TABS: 2.5000 mg | ORAL_TABLET | Freq: Every day | ORAL | Status: DC

## 2023-01-11 MED ORDER — LACTULOSE 10 GM/15ML PO SOLN
20.0000 g | Freq: Three times a day (TID) | ORAL | Status: DC
  Administered 2023-01-12 – 2023-01-14 (×7): 20 g via ORAL
  Filled 2023-01-11 (×7): qty 30

## 2023-01-11 MED ORDER — INSULIN ASPART 100 UNIT/ML IJ SOLN
0.0000 [IU] | Freq: Three times a day (TID) | INTRAMUSCULAR | Status: DC
  Administered 2023-01-12: 3 [IU] via SUBCUTANEOUS
  Administered 2023-01-12: 8 [IU] via SUBCUTANEOUS
  Administered 2023-01-12: 11 [IU] via SUBCUTANEOUS
  Administered 2023-01-13: 3 [IU] via SUBCUTANEOUS
  Administered 2023-01-13: 8 [IU] via SUBCUTANEOUS
  Administered 2023-01-13: 5 [IU] via SUBCUTANEOUS
  Administered 2023-01-14: 3 [IU] via SUBCUTANEOUS

## 2023-01-11 MED ORDER — ONDANSETRON HCL 4 MG/2ML IJ SOLN: 4.0000 mg | Freq: Four times a day (QID) | INTRAMUSCULAR | Status: DC | PRN

## 2023-01-11 MED ORDER — INSULIN GLARGINE-YFGN 100 UNIT/ML ~~LOC~~ SOLN
30.0000 [IU] | Freq: Every day | SUBCUTANEOUS | Status: DC
  Administered 2023-01-12: 30 [IU] via SUBCUTANEOUS
  Filled 2023-01-11 (×7): qty 0.3

## 2023-01-11 MED ORDER — COLESEVELAM HCL 625 MG PO TABS
1875.0000 mg | ORAL_TABLET | Freq: Two times a day (BID) | ORAL | Status: DC
  Administered 2023-01-12 – 2023-01-14 (×4): 1875 mg via ORAL
  Filled 2023-01-11 (×6): qty 3

## 2023-01-11 MED ORDER — GLUCERNA SHAKE PO LIQD
237.0000 mL | Freq: Two times a day (BID) | ORAL | Status: DC
  Administered 2023-01-12 – 2023-01-14 (×6): 237 mL via ORAL
  Filled 2023-01-11: qty 237

## 2023-01-11 MED ORDER — ACETAMINOPHEN 650 MG RE SUPP: 650.0000 mg | Freq: Four times a day (QID) | RECTAL | Status: DC | PRN

## 2023-01-11 MED ORDER — LACTULOSE 10 GM/15ML PO SOLN
30.0000 g | Freq: Once | ORAL | Status: AC
  Administered 2023-01-11: 30 g via ORAL
  Filled 2023-01-11: qty 45

## 2023-01-11 MED ORDER — SODIUM CHLORIDE 0.9 % IV SOLN: INTRAVENOUS | Status: AC

## 2023-01-11 MED ORDER — LORATADINE 10 MG PO TABS
10.0000 mg | ORAL_TABLET | Freq: Every day | ORAL | Status: DC
  Administered 2023-01-12 – 2023-01-14 (×3): 10 mg via ORAL
  Filled 2023-01-11 (×3): qty 1

## 2023-01-11 MED ORDER — ONDANSETRON HCL 4 MG PO TABS: 4.0000 mg | ORAL_TABLET | Freq: Four times a day (QID) | ORAL | Status: DC | PRN

## 2023-01-11 MED ORDER — ACETAMINOPHEN 325 MG PO TABS: 650.0000 mg | ORAL_TABLET | Freq: Four times a day (QID) | ORAL | Status: DC | PRN

## 2023-01-11 MED ORDER — ENOXAPARIN SODIUM 40 MG/0.4ML IJ SOSY
40.0000 mg | PREFILLED_SYRINGE | Freq: Every day | INTRAMUSCULAR | Status: DC
  Administered 2023-01-12 – 2023-01-14 (×3): 40 mg via SUBCUTANEOUS
  Filled 2023-01-11 (×3): qty 0.4

## 2023-01-11 MED ORDER — HYDROCHLOROTHIAZIDE 25 MG PO TABS
25.0000 mg | ORAL_TABLET | Freq: Every day | ORAL | Status: DC
  Administered 2023-01-12: 25 mg via ORAL
  Filled 2023-01-11: qty 1

## 2023-01-11 NOTE — ED Triage Notes (Signed)
Patient bib [facility] from [facility] nursing facility. Facility called due to patient have intermittent LOC. Patient was discharged 2 weeks ago after hip surgery. While in the hospital her liver enzymes were elevated. She was last seen normal (A&Ox4) 01/10/23 at 1915. She is currently A&Ox3. Disoriented to situation. EMS reports VSS.

## 2023-01-11 NOTE — Assessment & Plan Note (Signed)
Following a mechanical fall Status post intramedullary nailing on 12/29/22 and discharged to rehab Continue physical therapy once able Fall precautions

## 2023-01-11 NOTE — Assessment & Plan Note (Signed)
With complications of thrombocytopenia Continue lactulose will increase dose to 20 g 3 times daily

## 2023-01-11 NOTE — ED Provider Notes (Signed)
[facility] EMERGENCY DEPARTMENT AT [facility] Provider Note   CSN: [id number] Arrival date & time: 01/11/23  4098     History Chief Complaint  Patient presents with   Altered Mental Status    HPI Denise Klein is a 72 y.o. female presenting for progressive altered mental status from her nursing facility.  She has a history of Nash, hypertension, hyperlipidemia.  She was recently admitted to a SNF after a right hip replacement.  Posterior still very hard physical therapy yesterday walked over 160 feet.  Today she has been progressively confused.  Not able to take any medications.  She was given 1 dose of oxycodone as she was complaining about pain to her skilled nursing assistant. Progressively altered throughout the day only oriented to self, answering questions inappropriately..   Patient's recorded medical, surgical, social, medication list and allergies were reviewed in the Snapshot window as part of the initial history.   Review of Systems   Review of Systems  Constitutional:  Negative for chills and fever.  HENT:  Negative for ear pain and sore throat.   Eyes:  Negative for pain and visual disturbance.  Respiratory:  Negative for cough and shortness of breath.   Cardiovascular:  Negative for chest pain and palpitations.  Gastrointestinal:  Negative for abdominal pain and vomiting.  Genitourinary:  Negative for dysuria and hematuria.  Musculoskeletal:  Negative for arthralgias and back pain.  Skin:  Negative for color change and rash.  Neurological:  Negative for seizures and syncope.  Psychiatric/Behavioral:  Positive for confusion.   All other systems reviewed and are negative.   Physical Exam Updated Vital Signs BP (!) 160/52   Pulse 82   Temp 98.3 F (36.8 C)   Resp 19   Ht 5\' 4"  (1.626 m)   Wt 83 kg   SpO2 100%   BMI 31.41 kg/m  Physical Exam Vitals and nursing note reviewed.  Constitutional:      General: She is not in acute distress.     Appearance: She is well-developed.  HENT:     Head: Normocephalic and atraumatic.  Eyes:     Conjunctiva/sclera: Conjunctivae normal.  Cardiovascular:     Rate and Rhythm: Normal rate and regular rhythm.     Heart sounds: No murmur heard. Pulmonary:     Effort: Pulmonary effort is normal. No respiratory distress.     Breath sounds: Normal breath sounds.  Abdominal:     General: There is no distension.     Palpations: Abdomen is soft.     Tenderness: There is no abdominal tenderness. There is no right CVA tenderness or left CVA tenderness.  Musculoskeletal:        General: No swelling or tenderness. Normal range of motion.     Cervical back: Neck supple.  Skin:    General: Skin is warm and dry.  Neurological:     General: No focal deficit present.     Mental Status: She is alert. She is disoriented.     Cranial Nerves: No cranial nerve deficit.     Sensory: No sensory deficit.     Motor: No weakness.     Comments: Asterixis in both upper and lower extremities.  Not following commands.  Nonfocal upper lower extremity exam      ED Course/ Medical Decision Making/ A&P    Procedures .Critical Care  Performed by: Glyn Ade, MD Authorized by: Glyn Ade, MD   Critical care provider statement:  Critical care time (minutes):  30   Critical care was necessary to treat or prevent imminent or life-threatening deterioration of the following conditions:  Hepatic failure   Critical care was time spent personally by me on the following activities:  Development of treatment plan with patient or surrogate, discussions with consultants, evaluation of patient's response to treatment, examination of patient, ordering and review of laboratory studies, ordering and review of radiographic studies, ordering and performing treatments and interventions, pulse oximetry, re-evaluation of patient's condition and review of old charts    Medications Ordered in ED Medications   lactulose (CHRONULAC) 10 GM/15ML solution 30 g (30 g Oral Given 01/11/23 2118)   Medical Decision Making:   Denise Klein is a 72 y.o. female who presented to the ED today with altered mental status detailed above.    Patient's presentation is complicated by their history of multiple comorbid medical history.  Patient placed on continuous vitals and telemetry monitoring while in ED which was reviewed periodically.  Complete initial physical exam performed, notably the patient  was HDS in NAD.    Reviewed and confirmed nursing documentation for past medical history, family history, social history.    Initial Assessment:   With the patient's presentation of altered mental status, most likely diagnosis is delerium 2/2 infectious etiology (UTI/CAP/URI) vs metabolic abnormality (Na/K/Mg/Ca) vs nonspecific etiology. Other diagnoses were considered including (but not limited to) CVA, ICH, intracranial mass, critical dehydration, heptatic dysfunction, uremia, hypercarbia, intoxication, endrocrine abnormality, toxidrome. These are considered less likely due to history of present illness and physical exam findings.   This is most consistent with an acute life/limb threatening illness complicated by underlying chronic conditions.  Initial Plan:  CTH to evaluate for intracranial etiology of patient's symptoms  Screening labs including CBC and Metabolic panel to evaluate for infectious or metabolic etiology of disease.  Urinalysis with reflex culture ordered to evaluate for UTI or relevant urologic/nephrologic pathology.  CXR to evaluate for structural/infectious intrathoracic pathology.  Ammonia to evaluate for hepatic encephalopathy objective evaluation as below reviewed   Initial Study Results:   Laboratory  All laboratory results reviewed without evidence of clinically relevant pathology.    EKG EKG was reviewed independently. Rate, rhythm, axis, intervals all examined and without medically  relevant abnormality. ST segments without concerns for elevations.    Radiology:  All images reviewed independently. Agree with radiology report at this time.   CT HEAD WO CONTRAST ( )  Result Date: 01/11/2023 CLINICAL DATA:  Minor head trauma. Acute neurological deficit with stroke suspected. Intermittent loss of consciousness. Hip surgery 2 weeks ago. EXAM: CT HEAD WITHOUT CONTRAST TECHNIQUE: Contiguous axial images were obtained from the base of the skull through the vertex without intravenous contrast. RADIATION DOSE REDUCTION: This exam was performed according to the departmental dose-optimization program which includes automated exposure control, adjustment of the mA and/or kV according to patient size and/or use of iterative reconstruction technique. COMPARISON:  None Available. FINDINGS: Brain: Diffuse cerebral atrophy. Ventricular dilatation consistent with central atrophy. Low-attenuation changes in the deep white matter consistent with small vessel ischemia. No abnormal extra-axial fluid collections. No mass effect or midline shift. Gray-white matter junctions are distinct. Basal cisterns are not effaced. No acute intracranial hemorrhage. Vascular: No hyperdense vessel or unexpected calcification. Skull: Normal. Negative for fracture or focal lesion. Sinuses/Orbits: No acute finding. Other: None. IMPRESSION: No acute intracranial abnormalities. Chronic cerebral atrophy and small vessel ischemic changes. Electronically Signed   By: Burman Nieves M.D.   On:  01/11/2023 21:53   DG Chest Portable 1 View  Result Date: 01/11/2023 CLINICAL DATA:  Shortness of breath. Intermittent loss of consciousness. EXAM: PORTABLE CHEST 1 VIEW COMPARISON:  12/29/2022 FINDINGS: Shallow inspiration. Heart size and pulmonary vascularity are normal for technique. No airspace disease or consolidation in the lungs. No pleural effusions. No pneumothorax. Mediastinal contours appear intact. Central interstitial changes  may indicate chronic bronchitis. Degenerative changes in the spine and shoulders. IMPRESSION: Shallow inspiration. Probable chronic bronchitic changes in the lungs. No focal consolidation. Electronically Signed   By: Burman Nieves M.D.   On: 01/11/2023 20:26   DG FEMUR, MIN 2 VIEWS RIGHT  Result Date: 12/29/2022 CLINICAL DATA:  Right hip fracture status post ORIF EXAM: RIGHT FEMUR 2 VIEWS COMPARISON:  12/29/2022 FINDINGS: 6 fluoroscopic images are obtained during the performance of the procedure and are provided for interpretation only. Images demonstrate inter trochanteric right hip fracture, with subsequent placement of an intramedullary rod with proximal dynamic and distal interlocking screw. Alignment is anatomic. Fluoroscopy time: 46 seconds, 9.31 mGy IMPRESSION: 1. ORIF of an intertrochanteric right hip fracture as above. Anatomic alignment. Electronically Signed   By: Sharlet Salina M.D.   On: 12/29/2022 17:09   DG C-Arm 1-60 Min-No Report  Result Date: 12/29/2022 Fluoroscopy was utilized by the requesting physician.  No radiographic interpretation.   DG Hip Unilat  With Pelvis 2-3 Views Right  Result Date: 12/29/2022 CLINICAL DATA:  Fall. EXAM: DG HIP (WITH OR WITHOUT PELVIS) 2-3V RIGHT COMPARISON:  None Available. FINDINGS: Moderately displaced and comminuted fracture is seen involving intertrochanteric portion of right proximal femur. IMPRESSION: Moderately displaced and comminuted intertrochanteric fracture of proximal right femur. Electronically Signed   By: Lupita Raider M.D.   On: 12/29/2022 11:34   DG Chest Port 1 View  Result Date: 12/29/2022 CLINICAL DATA:  Fall. EXAM: PORTABLE CHEST 1 VIEW COMPARISON:  May 07, 2022. FINDINGS: Stable cardiomediastinal silhouette. Both lungs are clear. The visualized skeletal structures are unremarkable. IMPRESSION: No active disease. Electronically Signed   By: Lupita Raider M.D.   On: 12/29/2022 11:32      Consults: Case discussed  with hospitalist.   Final Assessment and Plan:   Patient's altered mental status is most likely secondary to hepatic encephalopathy based on elevated ammonia and clinical evidence of asterixis.  Per family she has not been having any bowel movements either related to noncompliance with lactulose or too low dose.  Will raise up to 30 g 3 times daily and arrange for admission to medicine for ongoing care management titration.     Clinical Impression:  1. Altered mental status, unspecified altered mental status type      Data Unavailable   Final Clinical Impression(s) / ED Diagnoses Final diagnoses:  Altered mental status, unspecified altered mental status type    Rx / DC Orders ED Discharge Orders     None         Glyn Ade, MD 01/11/23 2237

## 2023-01-11 NOTE — Assessment & Plan Note (Signed)
Continue long-acting insulin but decrease to one half of patient's scheduled dose due to poor oral intake Sliding scale coverage with short acting insulin Maintain consistent carbohydrate diet

## 2023-01-11 NOTE — Assessment & Plan Note (Signed)
-   Continue hydrochlorothiazide and metoprolol 

## 2023-01-11 NOTE — ED Notes (Signed)
ED TO INPATIENT HANDOFF REPORT  ED Nurse Name and Phone #: [place] Name/Age/Gender Denise Klein 72 y.o. female Room/Bed: 021C/021C  Code Status   Code Status: Full Code  Home/SNF/Other Rehab Patient oriented to: self, place, and time Is this baseline? No   Triage Complete: Triage complete  Chief Complaint Hepatic encephalopathy [facility]) [K76.82]  Triage Note Patient bib [facility] from [facility] nursing facility. Facility called due to patient have intermittent LOC. Patient was discharged 2 weeks ago after hip surgery. While in the hospital her liver enzymes were elevated. She was last seen normal (A&Ox4) 01/10/23 at 1915. She is currently A&Ox3. Disoriented to situation. EMS reports VSS.    Allergies Allergies  Allergen Reactions   Sulfamethoxazole-Trimethoprim Other (See Comments) and Rash   Acetaminophen Other (See Comments)    Liver affected   Ibuprofen Other (See Comments)    Liver affected   Meloxicam Other (See Comments)   Molds & Smuts    Naproxen Sodium Other (See Comments)    Liver affected   Pentazocine Other (See Comments)    hallucinations   Propoxyphene Swelling   Statins Other (See Comments)   Lisinopril Other (See Comments) and Nausea And Vomiting   Penicillin G Rash   Sulfamethoxazole Rash    Level of Care/Admitting Diagnosis ED Disposition   ED Disposition: Admit Condition: None Comment: Hospital Area: [facility] [[id number]]  Level of Care: Med-Surg [16]  May admit patient to [facility] or [facility] if equivalent level of care is available:: Yes  Covid Evaluation: Asymptomatic - no recent exposure (last 10 days) testing not required  Diagnosis: Hepatic encephalopathy (HCC) [572.2.ICD-9-CM]  Admitting Physician: Lonia Mad  Attending Physician: Lonia Mad  Certification:: I certify this patient will need inpatient services for at least 2 midnights  Estimated Length of Stay: 2       B Medical/Surgery History Past Medical History:  Diagnosis Date   Broken ankle 1982   right   Broken wrist 1999   left (external fixator rod)   Carpal tunnel syndrome, right 2000   Degenerative disc disease at L5-S1 level    Diabetes (HCC)    Endometriosis 1978   HTN (hypertension)    Thrombocytopenia (HCC)    Past Surgical History:  Procedure Laterality Date   ABDOMINAL HYSTERECTOMY  1999   CARPAL TUNNEL RELEASE Right    INTRAMEDULLARY (IM) NAIL INTERTROCHANTERIC Right 12/29/2022   Procedure: INTRAMEDULLARY (IM) NAILING OF RIGHT FEMUR;  Surgeon: Yolonda Kida, MD;  Location: [facility] OR;  Service: Orthopedics;  Laterality: Right;   TUBAL LIGATION  1986   WRIST SURGERY       A IV Location/Drains/Wounds Patient Lines/Drains/Airways Status     Active Line/Drains/Airways     Name Placement date Placement time Site Days   Peripheral IV 01/11/23 20 G Left;Posterior Hand 01/11/23  1900  Hand  less than 1            Intake/Output Last 24 hours No intake or output data in the 24 hours ending 01/11/23 2333  Labs/Imaging Results for orders placed or performed during the hospital encounter of 01/11/23 (from the past 48 hour(s))  CBC with Differential     Status: Abnormal   Collection Time: 01/11/23  7:20 PM  Result Value Ref Range   WBC 6.9 4.0 - 10.5 K/uL   RBC 2.89 (L) 3.87 - 5.11 MIL/uL   Hemoglobin 9.3 (L) 12.0 - 15.0 g/dL   HCT 40.9 (L) 81.1 -  46.0 %   MCV 95.8 80.0 - 100.0 fL   MCH 32.2 26.0 - 34.0 pg   MCHC 33.6 30.0 - 36.0 g/dL   RDW 16.1 (H) 09.6 - 04.5 %   Platelets 105 (L) 150 - 400 K/uL   nRBC 0.0 0.0 - 0.2 %   Neutrophils Relative % 63 %   Neutro Abs 4.3 1.7 - 7.7 K/uL   Lymphocytes Relative 24 %   Lymphs Abs 1.7 0.7 - 4.0 K/uL   Monocytes Relative 10 %   Monocytes Absolute 0.7 0.1 - 1.0 K/uL   Eosinophils Relative 3 %   Eosinophils Absolute 0.2 0.0 - 0.5 K/uL   Basophils Relative 0 %   Basophils Absolute 0.0 0.0 - 0.1 K/uL   Immature  Granulocytes 0 %   Abs Immature Granulocytes 0.03 0.00 - 0.07 K/uL    Comment: Performed at [facility], [address], [place], [place] [address]  Comprehensive metabolic panel     Status: Abnormal   Collection Time: 01/11/23  7:20 PM  Result Value Ref Range   Sodium 136 135 - 145 mmol/L   Potassium 4.6 3.5 - 5.1 mmol/L   Chloride 108 98 - 111 mmol/L   CO2 20 (L) 22 - 32 mmol/L   Glucose, Bld 227 (H) 70 - 99 mg/dL    Comment: Glucose reference range applies only to samples taken after fasting for at least 8 hours.   BUN 17 8 - 23 mg/dL   Creatinine, Ser 1.91 0.44 - 1.00 mg/dL   Calcium 8.7 (L) 8.9 - 10.3 mg/dL   Total Protein 5.6 (L) 6.5 - 8.1 g/dL   Albumin 2.5 (L) 3.5 - 5.0 g/dL   AST 35 15 - 41 U/L   ALT 28 0 - 44 U/L   Alkaline Phosphatase 109 38 - 126 U/L   Total Bilirubin 1.5 (H) 0.3 - 1.2 mg/dL   GFR, Estimated >47 >82 mL/min    Comment: (NOTE) Calculated using the CKD-EPI Creatinine Equation (2021)    Anion gap 8 5 - 15    Comment: Performed at [facility], [address], [place], [place] [address]  Troponin I (High Sensitivity)     Status: None   Collection Time: 01/11/23  7:20 PM  Result Value Ref Range   Troponin I (High Sensitivity) 6 <18 ng/L    Comment: (NOTE) Elevated high sensitivity troponin I (hsTnI) values and significant  changes across serial measurements may suggest ACS but many other  chronic and acute conditions are known to elevate hsTnI results.  Refer to the "Links" section for chest pain algorithms and additional  guidance. Performed at [facility], [address], [place], [place] [address]   Lipase, blood     Status: Abnormal   Collection Time: 01/11/23  7:20 PM  Result Value Ref Range   Lipase 65 (H) 11 - 51 U/L    Comment: Performed at [facility], [address], [place], [place] [address]  Ammonia     Status: Abnormal   Collection Time: 01/11/23  7:20 PM  Result Value Ref Range   Ammonia 94 (H) 9 - 35  umol/L    Comment: Performed at [facility], [address], [place], [place] [address]  Troponin I (High Sensitivity)     Status: None   Collection Time: 01/11/23 10:24 PM  Result Value Ref Range   Troponin I (High Sensitivity) 6 <18 ng/L  Comment: (NOTE) Elevated high sensitivity troponin I (hsTnI) values and significant  changes across serial measurements may suggest ACS but many other  chronic and acute conditions are known to elevate hsTnI results.  Refer to the "Links" section for chest pain algorithms and additional  guidance. Performed at [facility], [address], [place], [place] [address]    CT HEAD WO CONTRAST ( )  Result Date: 01/11/2023 CLINICAL DATA:  Minor head trauma. Acute neurological deficit with stroke suspected. Intermittent loss of consciousness. Hip surgery 2 weeks ago. EXAM: CT HEAD WITHOUT CONTRAST TECHNIQUE: Contiguous axial images were obtained from the base of the skull through the vertex without intravenous contrast. RADIATION DOSE REDUCTION: This exam was performed according to the departmental dose-optimization program which includes automated exposure control, adjustment of the mA and/or kV according to patient size and/or use of iterative reconstruction technique. COMPARISON:  None Available. FINDINGS: Brain: Diffuse cerebral atrophy. Ventricular dilatation consistent with central atrophy. Low-attenuation changes in the deep white matter consistent with small vessel ischemia. No abnormal extra-axial fluid collections. No mass effect or midline shift. Gray-white matter junctions are distinct. Basal cisterns are not effaced. No acute intracranial hemorrhage. Vascular: No hyperdense vessel or unexpected calcification. Skull: Normal. Negative for fracture or focal lesion. Sinuses/Orbits: No acute finding. Other: None. IMPRESSION: No acute intracranial abnormalities. Chronic cerebral atrophy and small vessel ischemic changes. Electronically Signed    By: Burman Nieves M.D.   On: 01/11/2023 21:53   DG Chest Portable 1 View  Result Date: 01/11/2023 CLINICAL DATA:  Shortness of breath. Intermittent loss of consciousness. EXAM: PORTABLE CHEST 1 VIEW COMPARISON:  12/29/2022 FINDINGS: Shallow inspiration. Heart size and pulmonary vascularity are normal for technique. No airspace disease or consolidation in the lungs. No pleural effusions. No pneumothorax. Mediastinal contours appear intact. Central interstitial changes may indicate chronic bronchitis. Degenerative changes in the spine and shoulders. IMPRESSION: Shallow inspiration. Probable chronic bronchitic changes in the lungs. No focal consolidation. Electronically Signed   By: Burman Nieves M.D.   On: 01/11/2023 20:26    Pending Labs Unresulted Labs (From admission, onward)     Start     Ordered   01/18/23 0500  Creatinine, serum  (enoxaparin (LOVENOX)    CrCl >/= 30 ml/min)  Weekly,   R     Comments: while on enoxaparin therapy    01/11/23 2257   01/12/23 0500  Basic metabolic panel  Tomorrow morning,   R        01/11/23 2257   01/12/23 0500  CBC  Tomorrow morning,   R        01/11/23 2257   01/11/23 1911  Urinalysis, Routine w reflex microscopic -Urine, Clean Catch  Once,   URGENT       Question:  Specimen Source  Answer:  Urine, Clean Catch   01/11/23 1919            Vitals/Pain Today's Vitals   01/11/23 1945 01/11/23 2015 01/11/23 2045 01/11/23 2310  BP: (Abnormal) 138/45 (Abnormal) 138/47 (Abnormal) 160/52   Pulse: 74 71 82   Resp: (Abnormal) 22 15 19    Temp:    97.6 F (36.4 C)  TempSrc:    Oral  SpO2: 98% 98% 100%   Weight:      Height:        Isolation Precautions No active isolations  Medications Medications  aspirin EC tablet 81 mg (has no administration in time range)  colesevelam [facility]) tablet 1,875 mg (has no administration in  time range)  hydrochlorothiazide (HYDRODIURIL) tablet 25 mg (has no administration in time range)  metoprolol  succinate (TOPROL-XL) 24 hr tablet 100 mg (has no administration in time range)  loratadine (CLARITIN) tablet 10 mg (has no administration in time range)  feeding supplement (GLUCERNA SHAKE) (GLUCERNA SHAKE) liquid 237 mL (has no administration in time range)  insulin aspart (novoLOG) injection 0-15 Units (has no administration in time range)  acetaminophen (TYLENOL) tablet 650 mg (has no administration in time range)    Or  acetaminophen (TYLENOL) suppository 650 mg (has no administration in time range)  ondansetron (ZOFRAN) tablet 4 mg (has no administration in time range)    Or  ondansetron (ZOFRAN) injection 4 mg (has no administration in time range)  enoxaparin (LOVENOX) injection 40 mg (has no administration in time range)  insulin glargine-yfgn (SEMGLEE) injection 30 Units (has no administration in time range)  lactulose (CHRONULAC) 10 GM/15ML solution 20 g (has no administration in time range)  0.9 %  sodium chloride infusion ( Intravenous New Bag/Given 01/11/23 2331)  lactulose (CHRONULAC) 10 GM/15ML solution 30 g (30 g Oral Given 01/11/23 2118)    Mobility walks with device     Focused Assessments Neuro Assessment Handoff:  Swallow screen pass?  N/A     Last date known well: 01/10/23 Last time known well: 1915 Neuro Assessment: Exceptions to WDL Neuro Checks:      Has TPA been given? No If patient is a Neuro Trauma and patient is going to OR before floor call report to 4N Charge nurse: [phone] or [phone]   R Recommendations: See Admitting Provider Note  Report given to:   Additional Notes:  Recent hip surgery. Walking short distances with a walker

## 2023-01-11 NOTE — H&P (Signed)
History and Physical    Patient: Denise Klein ZOX:[id number] DOB: [birth year] DOA: 01/11/2023 DOS: the patient was seen and examined on 01/11/2023 PCP: Lupita Raider, MD  Patient coming from: SNF  Chief Complaint:  Chief Complaint  Patient presents with   Altered Mental Status    Most of the history was obtained from patient's daughter-in-law who is at the bedside. HPI: PAULINE RITCHIE is a 72 y.o. female with medical history significant for recent right total hip arthroplasty, liver cirrhosis secondary to NASH, thrombocytopenia, hypertension, obesity who presents to the emergency room via EMS for evaluation of altered mental status. Per her daughter-in-law who provides the history, patient was in her usual state of health until this morning when she became confused.  She was unable to feed herself and required assistance with her meals which is unusual for her.  She was also noted to be in a lot of pain and received a dose of oxycodone.  Due to progressively worsening mental status changes and confusion she was brought into the ER for evaluation. Per her daughter-in-law she has been compliant with her lactulose but has had issues with constipation. During my assessment she is oriented to person and place but not to time.  She has no complaints. Abnormal labs include ammonia of 94, glucose of 227, lipase 65 CT scan of the head without contrast showed no acute intracranial abnormalities. Chronic cerebral atrophy and small vessel ischemic changes. Chest x-ray reviewed by me shows shallow inspiration. Probable chronic bronchitic changes in the lungs. No focal consolidation. Twelve-lead EKG reviewed by me shows sinus rhythm with LVH. Patient received a dose of lactulose in the ER and will be admitted to the hospital for further evaluation.    Review of Systems: unable to review all systems due to the inability of the patient to answer questions. Past Medical History:  Diagnosis Date    Broken ankle 1982   right   Broken wrist 1999   left (external fixator rod)   Carpal tunnel syndrome, right 2000   Degenerative disc disease at L5-S1 level    Diabetes (HCC)    Endometriosis 1978   HTN (hypertension)    Thrombocytopenia (HCC)    Past Surgical History:  Procedure Laterality Date   ABDOMINAL HYSTERECTOMY  1999   CARPAL TUNNEL RELEASE Right    INTRAMEDULLARY (IM) NAIL INTERTROCHANTERIC Right 12/29/2022   Procedure: INTRAMEDULLARY (IM) NAILING OF RIGHT FEMUR;  Surgeon: Yolonda Kida, MD;  Location: [facility] OR;  Service: Orthopedics;  Laterality: Right;   TUBAL LIGATION  1986   WRIST SURGERY     Social History:  reports that she has never smoked. She has never used smokeless tobacco. No history on file for alcohol use and drug use.  Allergies  Allergen Reactions   Sulfamethoxazole-Trimethoprim Other (See Comments) and Rash   Acetaminophen Other (See Comments)    Liver affected   Ibuprofen Other (See Comments)    Liver affected   Meloxicam Other (See Comments)   Molds & Smuts    Naproxen Sodium Other (See Comments)    Liver affected   Pentazocine Other (See Comments)    hallucinations   Propoxyphene Swelling   Statins Other (See Comments)   Lisinopril Other (See Comments) and Nausea And Vomiting   Penicillin G Rash   Sulfamethoxazole Rash    Family History  Problem Relation Age of Onset   Heart disease Father    Heart disease Maternal Grandfather    Diabetes Mother  CAD Sister     Prior to Admission medications   Medication Sig Start Date End Date Taking? Authorizing Provider  ACCU-CHEK AVIVA PLUS test strip  04/04/20   [provider]  amLODipine (NORVASC) 2.5 MG tablet Take 2.5 mg by mouth daily.    [provider]  aspirin EC 81 MG tablet Take 1 tablet (81 mg total) by mouth 2 (two) times daily. Swallow whole. 12/31/22 02/14/23  Dion Saucier D, PA  cetirizine (ZYRTEC) 10 MG tablet Take 10 mg by mouth daily. 06/14/20   [provider]  colesevelam (WELCHOL) 625 MG tablet Take 3 tablets by mouth 2 (two) times daily. 11/06/15   [provider]  feeding supplement (ENSURE ENLIVE / ENSURE PLUS) LIQD Take 237 mLs by mouth 2 (two) times daily between meals. 01/02/23   Narda Bonds, MD  hydrochlorothiazide (HYDRODIURIL) 25 MG tablet Take 1 tablet (25 mg total) by mouth daily. 01/03/23   Narda Bonds, MD  insulin aspart (NOVOLOG) 100 UNIT/ML injection Inject 10 Units into the skin 3 (three) times daily with meals. 01/02/23   Narda Bonds, MD  insulin degludec (TRESIBA FLEXTOUCH) 100 UNIT/ML FlexTouch Pen Inject 60 Units into the skin daily. 01/02/23   Narda Bonds, MD  lactulose (CHRONULAC) 10 GM/15ML solution Take 15 mLs (10 g total) by mouth 2 (two) times daily. 01/02/23   Narda Bonds, MD  metFORMIN (GLUCOPHAGE-XR) 500 MG 24 hr tablet Take 500 mg by mouth daily with breakfast. 11/06/15   [provider]  metoprolol succinate (TOPROL-XL) 100 MG 24 hr tablet Take 1 tablet (100 mg total) by mouth daily. 01/02/23   Narda Bonds, MD  oxyCODONE (ROXICODONE) 5 MG immediate release tablet Take 1 tablet (5 mg total) by mouth every 6 (six) hours as needed for severe pain. 12/30/22   Dion Saucier D, PA    Physical Exam: Vitals:   01/11/23 1945 01/11/23 2015 01/11/23 2045 01/11/23 2310  BP: (!) 138/45 (!) 138/47 (!) 160/52   Pulse: 74 71 82   Resp: (!) 22 15 19    Temp:    97.6 F (36.4 C)  TempSrc:    Oral  SpO2: 98% 98% 100%   Weight:      Height:       Physical Exam Vitals and nursing note reviewed.  Constitutional:      Comments: Sleepy but arousable.  Oriented to person and place but not time.  Chronically ill-appearing  HENT:     Head: Normocephalic.     Nose: Nose normal.     Mouth/Throat:     Mouth: Mucous membranes are dry.  Eyes:     Comments: Pale conjunctiva  Cardiovascular:     Rate and Rhythm: Normal rate and regular rhythm.  Pulmonary:     Effort: Pulmonary effort is  normal.     Breath sounds: Normal breath sounds.  Abdominal:     General: Abdomen is flat. Bowel sounds are normal.     Palpations: Abdomen is soft.  Musculoskeletal:     Cervical back: Normal range of motion and neck supple.     Comments: Decreased range of motion right hip  Skin:    General: Skin is warm and dry.  Neurological:     General: No focal deficit present.     Motor: Weakness present.     Comments: Asterixis  Psychiatric:     Comments: Depressed mood.  Flat affect.     Data Reviewed: Relevant notes  from primary care and specialist visits, past discharge summaries as available in EHR, including Care Everywhere. Prior diagnostic testing as pertinent to current admission diagnoses Updated medications and problem lists for reconciliation ED course, including vitals, labs, imaging, treatment and response to treatment Triage notes, nursing and pharmacy notes and ED provider's notes Notable results as noted in HPI Labs reviewed.  Troponin 6, sodium 136, potassium 4.6, chloride 108, bicarb 20, glucose 227, BUN 17, creatinine 0.74, calcium 8.7, total protein 5.6, albumin 2.5, AST 35, ALT 28, alkaline phosphatase 109, total bilirubin 1.5, lipase 65, ammonia 94, white count 6.9, hemoglobin 9.3, hematocrit 27.7, platelet count 105 There are no new results to review at this time.  Assessment and Plan: * Acute metabolic encephalopathy Patient presents to the ER for evaluation of altered mental status described mainly as confusion. Has a known history of liver cirrhosis secondary to [facility] and has elevated ammonia levels. Said to be compliant with her lactulose but not having regular bowel movements Increase lactulose to 20 g p.o. 3 times daily and titrate for 2 more loose stools Hold oxycodone for now Send UA  Liver cirrhosis secondary to NASH [facility]) With complications of thrombocytopenia Continue lactulose will increase dose to 20 g 3 times daily  Type 2 diabetes mellitus  without complication, with long-term current use of insulin (HCC) Continue long-acting insulin but decrease to one half of patient's scheduled dose due to poor oral intake Sliding scale coverage with short acting insulin Maintain consistent carbohydrate diet  Essential hypertension Continue hydrochlorothiazide and metoprolol  Closed right hip fracture (HCC) Following a mechanical fall Status post intramedullary nailing on 12/29/22 and discharged to rehab Continue physical therapy once able Fall precautions   Thrombocytopenia (HCC) Secondary to known liver cirrhosis No evidence of bleeding Monitor platelet count closely      Advance Care Planning:   Code Status: Full Code   Consults: PT  Family Communication: Greater than 50% of time was spent discussing patient's condition and plan of care with her daughter-in-law at the bedside.  All questions and concerns have been addressed.  She verbalizes understanding and agrees with the plan.  Patient's husband is her healthcare power of attorney.  Severity of Illness: The appropriate patient status for this patient is INPATIENT. Inpatient status is judged to be reasonable and necessary in order to provide the required intensity of service to ensure the patient's safety. The patient's presenting symptoms, physical exam findings, and initial radiographic and laboratory data in the context of their chronic comorbidities is felt to place them at high risk for further clinical deterioration. Furthermore, it is not anticipated that the patient will be medically stable for discharge from the hospital within 2 midnights of admission.   * I certify that at the point of admission it is my clinical judgment that the patient will require inpatient hospital care spanning beyond 2 midnights from the point of admission due to high intensity of service, high risk for further deterioration and high frequency of surveillance required.*  Author: Lucile Shutters, MD 01/11/2023 11:16 PM  For on call review [email or web address].

## 2023-01-11 NOTE — Assessment & Plan Note (Signed)
Patient presents to the ER for evaluation of altered mental status described mainly as confusion. Has a known history of liver cirrhosis secondary to [facility] and has elevated ammonia levels. Said to be compliant with her lactulose but not having regular bowel movements Increase lactulose to 20 g p.o. 3 times daily and titrate for 2 more loose stools Hold oxycodone for now Send UA

## 2023-01-11 NOTE — Assessment & Plan Note (Signed)
Secondary to known liver cirrhosis No evidence of bleeding Monitor platelet count closely

## 2023-01-12 DIAGNOSIS — K7581 Nonalcoholic steatohepatitis (NASH): Secondary | ICD-10-CM | POA: Diagnosis not present

## 2023-01-12 DIAGNOSIS — I10 Essential (primary) hypertension: Secondary | ICD-10-CM | POA: Diagnosis not present

## 2023-01-12 DIAGNOSIS — K7460 Unspecified cirrhosis of liver: Secondary | ICD-10-CM

## 2023-01-12 DIAGNOSIS — S72001D Fracture of unspecified part of neck of right femur, subsequent encounter for closed fracture with routine healing: Secondary | ICD-10-CM

## 2023-01-12 DIAGNOSIS — Z794 Long term (current) use of insulin: Secondary | ICD-10-CM

## 2023-01-12 DIAGNOSIS — E119 Type 2 diabetes mellitus without complications: Secondary | ICD-10-CM

## 2023-01-12 DIAGNOSIS — G9341 Metabolic encephalopathy: Secondary | ICD-10-CM | POA: Diagnosis not present

## 2023-01-12 DIAGNOSIS — D696 Thrombocytopenia, unspecified: Secondary | ICD-10-CM

## 2023-01-12 LAB — BASIC METABOLIC PANEL
Anion gap: 8 (ref 5–15)
BUN: 13 mg/dL (ref 8–23)
CO2: 19 mmol/L — ABNORMAL LOW (ref 22–32)
Calcium: 8.6 mg/dL — ABNORMAL LOW (ref 8.9–10.3)
Chloride: 113 mmol/L — ABNORMAL HIGH (ref 98–111)
Creatinine, Ser: 0.64 mg/dL (ref 0.44–1.00)
GFR, Estimated: >60 mL/min (ref >60)
Glucose, Bld: 161 mg/dL — ABNORMAL HIGH (ref 70–99)
Potassium: 4.2 mmol/L (ref 3.5–5.1)
Sodium: 140 mmol/L (ref 135–145)

## 2023-01-12 LAB — LIPASE, BLOOD: Lipase: 62 U/L — ABNORMAL HIGH (ref 11–51)

## 2023-01-12 LAB — CBC
HCT: 25.9 % — ABNORMAL LOW (ref 36.0–46.0)
Hemoglobin: 8.6 g/dL — ABNORMAL LOW (ref 12.0–15.0)
MCH: 31.4 pg (ref 26.0–34.0)
MCHC: 33.2 g/dL (ref 30.0–36.0)
MCV: 94.5 fL (ref 80.0–100.0)
Platelets: 101 10*3/uL — ABNORMAL LOW (ref 150–400)
RBC: 2.74 MIL/uL — ABNORMAL LOW (ref 3.87–5.11)
RDW: 18 % — ABNORMAL HIGH (ref 11.5–15.5)
WBC: 4.6 10*3/uL (ref 4.0–10.5)
nRBC: 0 % (ref 0.0–0.2)

## 2023-01-12 LAB — GLUCOSE, CAPILLARY
Glucose-Capillary: 155 mg/dL — ABNORMAL HIGH (ref 70–99)
Glucose-Capillary: 302 mg/dL — ABNORMAL HIGH (ref 70–99)
Glucose-Capillary: 297 mg/dL — ABNORMAL HIGH (ref 70–99)
Glucose-Capillary: 260 mg/dL — ABNORMAL HIGH (ref 70–99)

## 2023-01-12 LAB — AMMONIA: Ammonia: 60 umol/L — ABNORMAL HIGH (ref 9–35)

## 2023-01-12 MED ORDER — INSULIN ASPART 100 UNIT/ML IJ SOLN
3.0000 [IU] | Freq: Three times a day (TID) | INTRAMUSCULAR | Status: DC
  Administered 2023-01-12 – 2023-01-14 (×5): 3 [IU] via SUBCUTANEOUS

## 2023-01-12 MED ORDER — AMLODIPINE BESYLATE 5 MG PO TABS
5.0000 mg | ORAL_TABLET | Freq: Every day | ORAL | Status: DC
  Administered 2023-01-12 – 2023-01-14 (×3): 5 mg via ORAL
  Filled 2023-01-12 (×3): qty 1

## 2023-01-12 MED ORDER — GERHARDT'S BUTT CREAM
TOPICAL_CREAM | Freq: Two times a day (BID) | CUTANEOUS | Status: DC
  Filled 2023-01-12: qty 1

## 2023-01-12 MED ORDER — SENNOSIDES-DOCUSATE SODIUM 8.6-50 MG PO TABS
2.0000 | ORAL_TABLET | Freq: Two times a day (BID) | ORAL | Status: DC
  Administered 2023-01-13 – 2023-01-14 (×2): 2 via ORAL
  Filled 2023-01-12 (×4): qty 2

## 2023-01-12 MED ORDER — INSULIN GLARGINE-YFGN 100 UNIT/ML ~~LOC~~ SOLN
40.0000 [IU] | Freq: Every day | SUBCUTANEOUS | Status: DC
  Administered 2023-01-13 – 2023-01-14 (×2): 40 [IU] via SUBCUTANEOUS
  Filled 2023-01-12 (×2): qty 0.4

## 2023-01-12 MED ORDER — POLYETHYLENE GLYCOL 3350 17 G PO PACK: 17.0000 g | PACK | Freq: Every day | ORAL | Status: DC | PRN

## 2023-01-12 NOTE — Plan of Care (Signed)
  Problem: Education: Goal: Knowledge of General Education information will improve Description Including pain rating scale, medication(s)/side effects and non-pharmacologic comfort measures Outcome: Progressing   Problem: Health Behavior/Discharge Planning: Goal: Ability to manage health-related needs will improve Outcome: Progressing   

## 2023-01-12 NOTE — Progress Notes (Signed)
Mobility Specialist Progress Note   01/12/23 1755  Mobility  Activity Ambulated with assistance in room  Level of Assistance Minimal assist, patient does 75% or more  Assistive Device Front wheel walker  Distance Ambulated (ft) 12 ft  RLE Weight Bearing WBAT  Activity Response Tolerated well  Mobility Referral Yes  $Mobility charge 1 Mobility  Mobility Specialist Start Time (ACUTE ONLY) 1718  Mobility Specialist Stop Time (ACUTE ONLY) 1754  Mobility Specialist Time Calculation (min) (ACUTE ONLY) 36 min   Received in chair disinclined for mobility d/t pain in RLE but after encouragement from MS and husband pt agreeable. Pt limited by pain but able to stand and ambulate w/ minA and increased time. Pt having limited activity tolerance and only able to ambulate short distance before LE's began to tremor. Placed back in chair w/o fault, call bell in reach and chair alarm on. Pt c/o rectum pain post mobility, RN notified.  Frederico Hamman Mobility Specialist Please contact via SecureChat or  Rehab office at [phone]

## 2023-01-12 NOTE — Inpatient Diabetes Management (Signed)
Inpatient Diabetes Program Recommendations  AACE/ADA: New Consensus Statement on Inpatient Glycemic Control   Target Ranges:  Prepandial:   less than 140 mg/dL      Peak postprandial:   less than 180 mg/dL (1-2 hours)      Critically ill patients:  140 - 180 mg/dL    Latest Reference Range & Units 01/12/23 07:24 01/12/23 11:09  Glucose-Capillary 70 - 99 mg/dL 161 (H) 096 (H)    Latest Reference Range & Units 01/11/23 19:20  Glucose 70 - 99 mg/dL 045 (H)   Review of Glycemic Control  Diabetes history: DM2 Outpatient Diabetes medications: Tresiba 60 units daily, Novolog 10 units TID with meals, Metformin XR 500 mg QAM Current orders for Inpatient glycemic control: Semglee 30 units daily, Novolog 0-15 units TID with meals  Inpatient Diabetes Program Recommendations:    Insulin: Please consider ordering Novolog 3 units TID with meals for meal coverage if patient eats at least 50% of meals and adding Novolog 0-5 units QHS.  Thanks, Orlando Penner, RN, MSN, CDCES Diabetes Coordinator Inpatient Diabetes Program [phone] (Team Pager from 8am to 5pm)

## 2023-01-12 NOTE — Progress Notes (Signed)
[facility]                                                                               Denise Klein, is a 72 y.o. female, DOB - [birth year], ZOX:[id number] Admit date - 01/11/2023    Outpatient Primary MD for the patient is Denise Raider, MD  LOS - 1  days    Brief summary   Denise Klein is a 72 y.o. female with medical history significant for recent right total hip arthroplasty, liver cirrhosis secondary to NASH, thrombocytopenia, hypertension, obesity who presents to the emergency room via EMS for evaluation of altered mental status.  T scan of the head without contrast showed no acute intracranial abnormalities. Chronic cerebral atrophy and small vessel ischemic changes. Chest x-ray reviewed by me shows shallow inspiration. Probable chronic bronchitic changes in the lungs. No focal consolidation. Twelve-lead EKG reviewed by me shows sinus rhythm with LVH. Patient received a dose of lactulose in the ER and was admitted to the hospital for further evaluation.  Assessment & Plan    Assessment and Plan: * Acute metabolic encephalopathy  Suspect from hepatic encephalopathy.  Has a known history of liver cirrhosis secondary to [facility] and has elevated ammonia levels. Increased lactulose 20 mg tid.  2 BM this morning.  Improvement in the ammonia level.   Liver cirrhosis secondary to NASH [facility]) With complications of thrombocytopenia Increase lactulose to 20 mg TID.   Type 2 diabetes mellitus without complication, with long-term current use of insulin (HCC) CBG (last 3)  Recent Labs    01/12/23 0724 01/12/23 1109  GLUCAP 155* 302*   Continue with SSI. Continue with SEMGLEE 40 units daily, and SSI, novolog 3 units TIDAC.   Essential hypertension Not well controlled. Continue with metoprolol 100 mg daily and Norvasc 5 mg daily. Hold hydrochlorothiazide.   Closed right hip fracture (HCC) Following a mechanical fall Status post intramedullary nailing on  12/29/22 and discharged to rehab  Therapy evaluations ordered.  Fall precautions   Thrombocytopenia (HCC) Secondary to known liver cirrhosis No evidence of bleeding Monitor platelet count closely   Body mass index is 31.41 kg/m. Obesity.    Estimated body mass index is 31.41 kg/m as calculated from the following:   Height as of this encounter: 5\' 4"  (1.626 m).   Weight as of this encounter: 83 kg.  Code Status: full code.  DVT Prophylaxis:  enoxaparin (LOVENOX) injection 40 mg Start: 01/12/23 1000   Level of Care: Level of care: Med-Surg Family Communication: updated family at bedside.   Disposition Plan:     Remains inpatient appropriate:  acute encephalopathy.   Procedures:  None.   Consultants:   None.   Antimicrobials:   Anti-infectives (From admission, onward)    None        Medications  Scheduled Meds:  aspirin EC  81 mg Oral BID   colesevelam  1,875 mg Oral BID WC   enoxaparin (LOVENOX) injection  40 mg Subcutaneous Daily   feeding supplement (GLUCERNA SHAKE)  237 mL Oral BID BM   hydrochlorothiazide  25 mg Oral Daily   insulin aspart  0-15 Units Subcutaneous TID WC   insulin  glargine-yfgn  30 Units Subcutaneous Daily   lactulose  20 g Oral TID   loratadine  10 mg Oral Daily   metoprolol succinate  100 mg Oral Daily   Continuous Infusions:  sodium chloride 50 mL/hr at 01/11/23 2331   PRN Meds:.acetaminophen **OR** acetaminophen, ondansetron **OR** ondansetron (ZOFRAN) IV    Subjective:   Denise Klein was seen and examined today.  SHE IS MORE ALERT and answering simple questions.  No chest pain.  Objective:   Vitals:   01/11/23 2336 01/12/23 0049 01/12/23 0610 01/12/23 0741  BP: (!) 147/44 (!) 148/46 (!) 150/46 (!) 158/43  Pulse: 79 79 90 95  Resp: 17 16 17 18   Temp:  98.3 F (36.8 C) 98.6 F (37 C) 98.8 F (37.1 C)  TempSrc:  Oral Oral Oral  SpO2: 100% 100% 99% 96%  Weight:      Height:        Intake/Output Summary (Last  24 hours) at 01/12/2023 0853 Last data filed at 01/12/2023 0414 Gross per 24 hour  Intake 235.83 ml  Output --  Net 235.83 ml   Filed Weights   01/11/23 1906  Weight: 83 kg     Exam General exam: Appears calm and comfortable  Respiratory system: Clear to auscultation. Respiratory effort normal. Cardiovascular system: S1 & S2 heard, RRR.  Gastrointestinal system: Abdomen is nondistended, soft and nontender.  Central nervous system: Alert and oriented.  Extremities: no pedal edema.  Skin: No rashes,  Psychiatry: Mood & affect appropriate.     Data Reviewed:  I have personally reviewed following labs and imaging studies   CBC Lab Results  Component Value Date   WBC 4.6 01/12/2023   RBC 2.74 (L) 01/12/2023   HGB 8.6 (L) 01/12/2023   HCT 25.9 (L) 01/12/2023   MCV 94.5 01/12/2023   MCH 31.4 01/12/2023   PLT 101 (L) 01/12/2023   MCHC 33.2 01/12/2023   RDW 18.0 (H) 01/12/2023   LYMPHSABS 1.7 01/11/2023   MONOABS 0.7 01/11/2023   EOSABS 0.2 01/11/2023   BASOSABS 0.0 01/11/2023     Last metabolic panel Lab Results  Component Value Date   NA 136 01/11/2023   K 4.6 01/11/2023   CL 108 01/11/2023   CO2 20 (L) 01/11/2023   BUN 17 01/11/2023   CREATININE 0.74 01/11/2023   GLUCOSE 227 (H) 01/11/2023   GFRNONAA >60 01/11/2023   GFRAA >60 11/17/2019   CALCIUM 8.7 (L) 01/11/2023   PROT 5.6 (L) 01/11/2023   ALBUMIN 2.5 (L) 01/11/2023   BILITOT 1.5 (H) 01/11/2023   ALKPHOS 109 01/11/2023   AST 35 01/11/2023   ALT 28 01/11/2023   ANIONGAP 8 01/11/2023    CBG (last 3)  Recent Labs    01/12/23 0724  GLUCAP 155*      Coagulation Profile: No results for input(s): "INR", "PROTIME" in the last 168 hours.   Radiology Studies: CT HEAD WO CONTRAST ( )  Result Date: 01/11/2023 CLINICAL DATA:  Minor head trauma. Acute neurological deficit with stroke suspected. Intermittent loss of consciousness. Hip surgery 2 weeks ago. EXAM: CT HEAD WITHOUT CONTRAST TECHNIQUE:  Contiguous axial images were obtained from the base of the skull through the vertex without intravenous contrast. RADIATION DOSE REDUCTION: This exam was performed according to the departmental dose-optimization program which includes automated exposure control, adjustment of the mA and/or kV according to patient size and/or use of iterative reconstruction technique. COMPARISON:  None Available. FINDINGS: Brain: Diffuse cerebral atrophy. Ventricular dilatation consistent with central atrophy.  Low-attenuation changes in the deep white matter consistent with small vessel ischemia. No abnormal extra-axial fluid collections. No mass effect or midline shift. Gray-white matter junctions are distinct. Basal cisterns are not effaced. No acute intracranial hemorrhage. Vascular: No hyperdense vessel or unexpected calcification. Skull: Normal. Negative for fracture or focal lesion. Sinuses/Orbits: No acute finding. Other: None. IMPRESSION: No acute intracranial abnormalities. Chronic cerebral atrophy and small vessel ischemic changes. Electronically Signed   By: Burman Nieves M.D.   On: 01/11/2023 21:53   DG Chest Portable 1 View  Result Date: 01/11/2023 CLINICAL DATA:  Shortness of breath. Intermittent loss of consciousness. EXAM: PORTABLE CHEST 1 VIEW COMPARISON:  12/29/2022 FINDINGS: Shallow inspiration. Heart size and pulmonary vascularity are normal for technique. No airspace disease or consolidation in the lungs. No pleural effusions. No pneumothorax. Mediastinal contours appear intact. Central interstitial changes may indicate chronic bronchitis. Degenerative changes in the spine and shoulders. IMPRESSION: Shallow inspiration. Probable chronic bronchitic changes in the lungs. No focal consolidation. Electronically Signed   By: Burman Nieves M.D.   On: 01/11/2023 20:26       Kathlen Mody M.D. [facility] 01/12/2023, 8:53 AM  Available via Epic secure chat 7am-7pm After 7 pm, please refer to night  coverage provider listed on amion.

## 2023-01-12 NOTE — ED Notes (Signed)
Soiled brief removed and replace with clean and dry brief.

## 2023-01-13 DIAGNOSIS — G9341 Metabolic encephalopathy: Secondary | ICD-10-CM | POA: Diagnosis not present

## 2023-01-13 DIAGNOSIS — E119 Type 2 diabetes mellitus without complications: Secondary | ICD-10-CM | POA: Diagnosis not present

## 2023-01-13 DIAGNOSIS — K7581 Nonalcoholic steatohepatitis (NASH): Secondary | ICD-10-CM | POA: Diagnosis not present

## 2023-01-13 DIAGNOSIS — I10 Essential (primary) hypertension: Secondary | ICD-10-CM | POA: Diagnosis not present

## 2023-01-13 LAB — BASIC METABOLIC PANEL
Anion gap: 6 (ref 5–15)
BUN: 14 mg/dL (ref 8–23)
CO2: 22 mmol/L (ref 22–32)
Calcium: 8.9 mg/dL (ref 8.9–10.3)
Chloride: 105 mmol/L (ref 98–111)
Creatinine, Ser: 0.77 mg/dL (ref 0.44–1.00)
GFR, Estimated: >60 mL/min (ref >60)
Glucose, Bld: 285 mg/dL — ABNORMAL HIGH (ref 70–99)
Potassium: 4.2 mmol/L (ref 3.5–5.1)
Sodium: 133 mmol/L — ABNORMAL LOW (ref 135–145)

## 2023-01-13 LAB — CBC WITH DIFFERENTIAL/PLATELET
Abs Immature Granulocytes: 0.03 10*3/uL (ref 0.00–0.07)
Basophils Absolute: 0 10*3/uL (ref 0.0–0.1)
Basophils Relative: 1 %
Eosinophils Absolute: 0.2 10*3/uL (ref 0.0–0.5)
Eosinophils Relative: 3 %
HCT: 27.3 % — ABNORMAL LOW (ref 36.0–46.0)
Hemoglobin: 9.1 g/dL — ABNORMAL LOW (ref 12.0–15.0)
Immature Granulocytes: 1 %
Lymphocytes Relative: 16 %
Lymphs Abs: 0.9 10*3/uL (ref 0.7–4.0)
MCH: 31.6 pg (ref 26.0–34.0)
MCHC: 33.3 g/dL (ref 30.0–36.0)
MCV: 94.8 fL (ref 80.0–100.0)
Monocytes Absolute: 0.7 10*3/uL (ref 0.1–1.0)
Monocytes Relative: 11 %
Neutro Abs: 4.1 10*3/uL (ref 1.7–7.7)
Neutrophils Relative %: 68 %
Platelets: 103 10*3/uL — ABNORMAL LOW (ref 150–400)
RBC: 2.88 MIL/uL — ABNORMAL LOW (ref 3.87–5.11)
RDW: 17.9 % — ABNORMAL HIGH (ref 11.5–15.5)
WBC: 5.9 10*3/uL (ref 4.0–10.5)
nRBC: 0 % (ref 0.0–0.2)

## 2023-01-13 LAB — GLUCOSE, CAPILLARY
Glucose-Capillary: 169 mg/dL — ABNORMAL HIGH (ref 70–99)
Glucose-Capillary: 294 mg/dL — ABNORMAL HIGH (ref 70–99)
Glucose-Capillary: 205 mg/dL — ABNORMAL HIGH (ref 70–99)
Glucose-Capillary: 257 mg/dL — ABNORMAL HIGH (ref 70–99)

## 2023-01-13 LAB — AMMONIA: Ammonia: 19 umol/L (ref 9–35)

## 2023-01-13 MED ORDER — WITCH HAZEL-GLYCERIN EX PADS: MEDICATED_PAD | CUTANEOUS | Status: DC | PRN

## 2023-01-13 MED ORDER — HYDROCORTISONE (PERIANAL) 2.5 % EX CREA
TOPICAL_CREAM | Freq: Three times a day (TID) | CUTANEOUS | Status: DC
  Filled 2023-01-13: qty 28.35

## 2023-01-13 NOTE — TOC Initial Note (Signed)
Transition of Care [facility]) - Initial/Assessment Note    Patient Details  Name: Denise Klein MRN: [id number] Date of Birth: 01-26-1951  [facility]) CM/SW Contact:    Baldemar Lenis, LCSW Phone Number: 01/13/2023, 2:56 PM  Clinical Narrative:          CSW spoke with patient's spouse, Molly Maduro, to discuss disposition. Patient from [facility] where she was receiving rehab, and family has been holding the bed for patient to return. Patient will need new insurance authorization, awaiting PT to eval. CSW answered spouse's questions on insurance approval. CSW confirmed with [facility] that patient can return once approved. CSW updated MD on barriers to discharge. CSW to follow.         Expected Discharge Plan: Skilled Nursing Facility Barriers to Discharge: Continued Medical Work up, English as a second language teacher   Patient Goals and CMS Choice Patient states their goals for this hospitalization and ongoing recovery are:: unable to assess, not fully oriented CMS Medicare.gov Compare Post Acute Care list provided to:: Patient Represenative (must comment) Choice offered to / list presented to : Spouse [place] ownership interest in [facility].provided to:: Spouse    Expected Discharge Plan and Services     Post Acute Care Choice: Skilled Nursing Facility Living arrangements for the past 2 months: Single Family Home                                      Prior Living Arrangements/Services Living arrangements for the past 2 months: Single Family Home Lives with:: Spouse Patient language and need for interpreter reviewed:: Yes Do you feel safe going back to the place where you live?: Yes      Need for Family Participation in Patient Care: Yes (Comment) Care giver support system in place?: No (comment)   Criminal Activity/Legal Involvement Pertinent to Current Situation/Hospitalization: No - Comment as needed  Activities of Daily Living      Permission  Sought/Granted Permission sought to share information with : Facility Medical sales representative, Family Supports Permission granted to share information with : Yes, Verbal Permission Granted  Share Information with NAME: Molly Maduro  Permission granted to share info w AGENCY: [facility]  Permission granted to share info w Relationship: Spouse     Emotional Assessment Appearance:: Appears stated age Attitude/Demeanor/Rapport: Unable to Assess Affect (typically observed): Unable to Assess Orientation: : Oriented to Self, Oriented to Place, Oriented to Situation Alcohol / Substance Use: Not Applicable Psych Involvement: No (comment)  Admission diagnosis:  Hepatic encephalopathy (HCC) [K76.82] Altered mental status, unspecified altered mental status type [R41.82] Patient Active Problem List   Diagnosis Date Noted   Acute metabolic encephalopathy 01/11/2023   Closed right hip fracture (HCC) 12/29/2022   Normocytic anemia 12/29/2022   Type 2 diabetes mellitus without complication, with long-term current use of insulin (HCC) 12/29/2022   Essential hypertension 12/29/2022   Liver cirrhosis secondary to NASH (HCC) 12/29/2022   Obesity (BMI 30-39.9) 12/29/2022   Other pancytopenia (HCC) 12/27/2020   Thrombocytopenia (HCC) 11/17/2019   PCP:  Lupita Raider, MD Pharmacy:   [facility] [phone] - [place], [place] - [address] AT [facility] [address] [place] [address] Phone: [phone] Fax: [phone]     Social Determinants of Health (SDOH) Social History: SDOH Screenings   Tobacco Use: Low Risk  (01/02/2023)   SDOH Interventions:     Readmission Risk Interventions  No data to display

## 2023-01-13 NOTE — Evaluation (Signed)
Occupational Therapy Evaluation Patient Details Name: Denise Klein MRN: [id number] DOB: [birth year] Today's Date: 01/13/2023   History of Present Illness Pt is 72 yo female who presents on 01/11/23 with AMS. PMH: R femur IM nail 12/29/22, liver cirrhosis secondary to NASH, DM, thrombocytopenia, hypertension, obesity   Clinical Impression   PTA, pt admitted from SNF rehab with deficits noted below. Prior to initial R hip fx/sx in May, pt was completely independent in all daily tasks without need for AD. Overall, pt currently requires Min-Mod A to stand with RW, but able to mobilize short distances with min guard once up. Pt requires Setup for UB ADL and Min-Mod A for LB ADLs. Pt still with some confusion but showing insight into reason for hospitalization and current deficits. Patient will benefit from continued inpatient follow up therapy, <3 hours/day.      Recommendations for follow up therapy are one component of a multi-disciplinary discharge planning process, led by the attending physician.  Recommendations may be updated based on patient status, additional functional criteria and insurance authorization.   Assistance Recommended at Discharge Frequent or constant Supervision/Assistance  Patient can return home with the following A little help with walking and/or transfers;A lot of help with bathing/dressing/bathroom;Assistance with cooking/housework;Direct supervision/assist for medications management;Direct supervision/assist for financial management;Assist for transportation;Help with stairs or ramp for entrance    Functional Status Assessment  Patient has had a recent decline in their functional status and demonstrates the ability to make significant improvements in function in a reasonable and predictable amount of time.  Equipment Recommendations  BSC/3in1;Other (comment) (RW)    Recommendations for Other Services       Precautions / Restrictions Precautions Precautions:  Fall Restrictions Weight Bearing Restrictions: Yes RLE Weight Bearing: Weight bearing as tolerated      Mobility Bed Mobility Overal bed mobility: Needs Assistance Bed Mobility: Supine to Sit     Supine to sit: Mod assist, HOB elevated     General bed mobility comments: assist for RLE to EOB and to lift trunk    Transfers Overall transfer level: Needs assistance Equipment used: Rolling walker (2 wheels) Transfers: Sit to/from Stand, Bed to chair/wheelchair/BSC Sit to Stand: Min assist, Mod assist     Step pivot transfers: Min guard     General transfer comment: Mod A to stand from low bed, progress to Min A from [facility] (pushing from armrests). Once up, pt able to step to [facility] and then mobilize around bed to chair using RW with increased time      Balance Overall balance assessment: Needs assistance Sitting-balance support: Feet supported Sitting balance-Leahy Scale: Fair     Standing balance support: Bilateral upper extremity supported, During functional activity, Reliant on assistive device for balance Standing balance-Leahy Scale: Poor                             ADL either performed or assessed with clinical judgement   ADL Overall ADL's : Needs assistance/impaired Eating/Feeding: Independent   Grooming: Set up;Sitting   Upper Body Bathing: Set up;Sitting   Lower Body Bathing: Minimal assistance;Sit to/from stand Lower Body Bathing Details (indicate cue type and reason): assist for thoroughness of posterior hygiene though pt assisting well in standing, assist for lower LE Upper Body Dressing : Set up;Sitting   Lower Body Dressing: Moderate assistance;Sit to/from stand   Toilet Transfer: Minimal assistance;Stand-pivot;BSC/3in1;Rolling walker (2 wheels) Toilet Transfer Details (indicate cue type and reason): assist to  stand but once up, able to step to [facility] using RW with slow pacing Toileting- Clothing Manipulation and Hygiene: Moderate  assistance;Sitting/lateral lean;Sit to/from stand Toileting - Clothing Manipulation Details (indicate cue type and reason): clothing mgmt assist with mesh underwear and hygiene     Functional mobility during ADLs: Min guard;Rolling walker (2 wheels)       Vision Baseline Vision/History: 1 Wears glasses (reading) Ability to See in Adequate Light: 0 Adequate Patient Visual Report: No change from baseline Vision Assessment?: No apparent visual deficits     Perception     Praxis      Pertinent Vitals/Pain Pain Assessment Pain Assessment: Faces Faces Pain Scale: Hurts a little bit Pain Location: R hip Pain Descriptors / Indicators: Discomfort Pain Intervention(s): Monitored during session     Hand Dominance Right   Extremity/Trunk Assessment Upper Extremity Assessment Upper Extremity Assessment: Generalized weakness   Lower Extremity Assessment Lower Extremity Assessment: Defer to PT evaluation   Cervical / Trunk Assessment Cervical / Trunk Assessment: Normal   Communication Communication Communication: HOH   Cognition Arousal/Alertness: Awake/alert Behavior During Therapy: WFL for tasks assessed/performed, Flat affect Overall Cognitive Status: Difficult to assess Area of Impairment: Memory, Safety/judgement, Awareness, Problem solving                     Memory: Decreased short-term memory   Safety/Judgement: Decreased awareness of safety, Decreased awareness of deficits Awareness: Emergent Problem Solving: Slow processing, Decreased initiation, Difficulty sequencing, Requires verbal cues, Requires tactile cues General Comments: pleasant, difficult to fully assess cognition due to [facility] but follows commands consistently. shows insight into reason for hospitalization. minor confusion and mild memory deficits noted - asking for items, assist repeatedly despite already educating that assistance would be provided, etc (set up to brush teeth, etc)     General  Comments  NT present and assisting    Exercises     Shoulder Instructions      Home Living Family/patient expects to be discharged to:: Private residence Living Arrangements: Spouse/significant other Available Help at Discharge: Family;Available 24 hours/day Type of Home: House Home Access: Stairs to enter [employer] of Steps: 3-4 steps side entrance. 5 steps back entrance Entrance Stairs-Rails: Right;Left;Can reach both Home Layout: One level     Bathroom Shower/Tub: Tub/shower unit;Walk-in shower         Home Equipment: None          Prior Functioning/Environment Prior Level of Function : Independent/Modified Independent;Driving             Mobility Comments: Independent prior to 5/20 IM nailing of R hip; now working with SNF rehab using RW for mobility ADLs Comments: prior to 5/20 IM nailing of R hip fx, pt Independent with ADLs, IADLs. Now working with therapy at [facility] SNF rehab -making progress though some assist still needed for ADLs.        OT Problem List: Decreased strength;Pain;Decreased cognition;Decreased activity tolerance;Decreased safety awareness;Impaired balance (sitting and/or standing);Decreased knowledge of use of DME or AE;Decreased knowledge of precautions      OT Treatment/Interventions: Self-care/ADL training;Therapeutic exercise;Therapeutic activities;Cognitive remediation/compensation;Neuromuscular education;Energy conservation;DME and/or AE instruction;Patient/family education;Balance training;Manual therapy    OT Goals(Current goals can be found in the care plan section) Acute Rehab OT Goals Patient Stated Goal: R hip pain control; continue progress with therapy to go home OT Goal Formulation: With patient Time For Goal Achievement: 01/27/23 Potential to Achieve Goals: Good ADL Goals Pt Will Perform Lower Body Bathing: sit to/from stand;with supervision  Pt Will Perform Lower Body Dressing: with supervision;sit to/from  stand Pt Will Transfer to Toilet: with set-up;ambulating Pt Will Perform Toileting - Clothing Manipulation and hygiene: with supervision;sit to/from stand;sitting/lateral leans  OT Frequency: Min 2X/week    Co-evaluation              AM-PAC OT "6 Clicks" Daily Activity     Outcome Measure Help from another person eating meals?: A Little Help from another person taking care of personal grooming?: A Little Help from another person toileting, which includes using toliet, bedpan, or urinal?: A Lot Help from another person bathing (including washing, rinsing, drying)?: A Little Help from another person to put on and taking off regular upper body clothing?: A Little Help from another person to put on and taking off regular lower body clothing?: A Lot 6 Click Score: 16   End of Session Equipment Utilized During Treatment: Rolling walker (2 wheels) Nurse Communication: Mobility status  Activity Tolerance: Patient tolerated treatment well Patient left: in chair;with call bell/phone within reach;with chair alarm set;with nursing/sitter in room  OT Visit Diagnosis: Unsteadiness on feet (R26.81);Muscle weakness (generalized) (M62.81);History of falling (Z91.81);Pain Pain - Right/Left: Right Pain - part of body: Hip                Time: 0800-0828 OT Time Calculation (min): 28 min Charges:  OT General Charges $OT Visit: 1 Visit OT Evaluation $OT Eval Moderate Complexity: 1 Mod OT Treatments $Self Care/Home Management : 8-22 mins  Bradd Canary, OTR/L Acute Rehab Services Office: [phone]   Lorre Munroe 01/13/2023, 8:53 AM

## 2023-01-13 NOTE — [place] FL2 (Signed)
[place] MEDICAID FL2 LEVEL OF CARE FORM     IDENTIFICATION  Patient Name: Denise Klein Birthdate: 1950-09-27 Sex: female Admission Date (Current Location): 01/11/2023  [facility] and [place] Number:  Producer, television/film/video and Address:  [facility], [address] [place], [place] [address]      Provider Number: [id number]  Attending Physician Name and Address:  Kathlen Mody, MD  Relative Name and Phone Number:       Current Level of Care: Hospital Recommended Level of Care: Skilled Nursing Facility Prior Approval Number:    Date Approved/Denied:   PASRR Number:    Discharge Plan: SNF    Current Diagnoses: Patient Active Problem List   Diagnosis Date Noted   Acute metabolic encephalopathy 01/11/2023   Closed right hip fracture (HCC) 12/29/2022   Normocytic anemia 12/29/2022   Type 2 diabetes mellitus without complication, with long-term current use of insulin (HCC) 12/29/2022   Essential hypertension 12/29/2022   Liver cirrhosis secondary to NASH (HCC) 12/29/2022   Obesity (BMI 30-39.9) 12/29/2022   Other pancytopenia (HCC) 12/27/2020   Thrombocytopenia (HCC) 11/17/2019    Orientation RESPIRATION BLADDER Height & Weight     Self, Situation, Place  Normal Continent Weight: 182 lb 15.7 oz (83 kg) Height:  5\' 4"  (162.6 cm)  BEHAVIORAL SYMPTOMS/MOOD NEUROLOGICAL BOWEL NUTRITION STATUS      Incontinent, Continent (incontinent at times) Diet (heart healthy/carb modified)  AMBULATORY STATUS COMMUNICATION OF NEEDS Skin   Extensive Assist Verbally Surgical wounds, Other (Comment) (closed right hip, aquacel; moisture associated skin damage, sacrum: foam dressing, lift every shift to assess, change PRN)                       Personal Care Assistance Level of Assistance  Bathing, Feeding, Dressing Bathing Assistance: Maximum assistance Feeding assistance: Limited assistance Dressing Assistance: Maximum assistance     Functional  Limitations Info             SPECIAL CARE FACTORS FREQUENCY  PT (By licensed PT), OT (By licensed OT)     PT Frequency: 5x/wk OT Frequency: 5x/wk            Contractures Contractures Info: Not present    Additional Factors Info  Code Status, Allergies, Insulin Sliding Scale Code Status Info: Full Allergies Info: Sulfamethoxazole-trimethoprim, Acetaminophen, Ibuprofen, Meloxicam, Molds & Smuts, Naproxen Sodium, Pentazocine, Propoxyphene, Statins, Lisinopril, Penicillin G, Sulfamethoxazole   Insulin Sliding Scale Info: see DC summary       Current Medications (01/13/2023):  This is the current hospital active medication list Current Facility-Administered Medications  Medication Dose Route Frequency Provider Last Rate Last Admin   acetaminophen (TYLENOL) tablet 650 mg  650 mg Oral Q6H PRN Agbata, Tochukwu, MD       Or   acetaminophen (TYLENOL) suppository 650 mg  650 mg Rectal Q6H PRN Agbata, Tochukwu, MD       amLODipine (NORVASC) tablet 5 mg  5 mg Oral Daily Kathlen Mody, MD   5 mg at 01/13/23 8119   aspirin EC tablet 81 mg  81 mg Oral BID Agbata, Tochukwu, MD   81 mg at 01/13/23 0929   colesevelam [facility]) tablet 1,875 mg  1,875 mg Oral BID WC Agbata, Tochukwu, MD   1,875 mg at 01/13/23 0929   enoxaparin (LOVENOX) injection 40 mg  40 mg Subcutaneous Daily Agbata, Tochukwu, MD   40 mg at 01/13/23 0930   feeding supplement (GLUCERNA SHAKE) (GLUCERNA SHAKE) liquid  237 mL  237 mL Oral BID BM Agbata, Tochukwu, MD   237 mL at 01/13/23 1338   Gerhardt's butt cream   Topical BID Kathlen Mody, MD   Given at 01/13/23 0931   insulin aspart (novoLOG) injection 0-15 Units  0-15 Units Subcutaneous TID WC Agbata, Tochukwu, MD   8 Units at 01/13/23 1428   insulin aspart (novoLOG) injection 3 Units  3 Units Subcutaneous TID WC Kathlen Mody, MD   3 Units at 01/13/23 1429   insulin glargine-yfgn (SEMGLEE) injection 40 Units  40 Units Subcutaneous Daily Kathlen Mody, MD   40 Units at  01/13/23 0930   lactulose (CHRONULAC) 10 GM/15ML solution 20 g  20 g Oral TID Agbata, Tochukwu, MD   20 g at 01/13/23 0931   loratadine (CLARITIN) tablet 10 mg  10 mg Oral Daily Agbata, Tochukwu, MD   10 mg at 01/13/23 0929   metoprolol succinate (TOPROL-XL) 24 hr tablet 100 mg  100 mg Oral Daily Agbata, Tochukwu, MD   100 mg at 01/13/23 0929   ondansetron (ZOFRAN) tablet 4 mg  4 mg Oral Q6H PRN Agbata, Tochukwu, MD       Or   ondansetron (ZOFRAN) injection 4 mg  4 mg Intravenous Q6H PRN Agbata, Tochukwu, MD       polyethylene glycol (MIRALAX / GLYCOLAX) packet 17 g  17 g Oral Daily PRN Kathlen Mody, MD       senna-docusate (Senokot-S) tablet 2 tablet  2 tablet Oral BID Kathlen Mody, MD   2 tablet at 01/13/23 1610     Discharge Medications: Please see discharge summary for a list of discharge medications.  Relevant Imaging Results:  Relevant Lab Results:   Additional Information SS#: [id number]  Baldemar Lenis, LCSW

## 2023-01-13 NOTE — Evaluation (Signed)
Physical Therapy Evaluation Patient Details Name: Denise Klein MRN: [id number] DOB: 18-Feb-1951 Today's Date: 01/13/2023  History of Present Illness  Pt is 72 yo female who presents on 01/11/23 with AMS. PMH: R femur IM nail 12/29/22, liver cirrhosis secondary to NASH, DM, thrombocytopenia, hypertension, obesity  Clinical Impression  Pt admitted with above diagnosis. Pt from [facility] where she had been in rehab after R hip fx. Pt hopes to return there to continue her rehab, was independent before last admission. Pt with mild confusion on eval, esp to recent events. Eval significantly limited by bowel and bladder incontinence. Was able to lead pt through LE strengthening exercises for hip fx. Pt able to sit>stand with min A to RW but could not ambulate away from [facility] due to frequency. Patient will benefit from continued inpatient follow up therapy, <3 hours/day.  Pt currently with functional limitations due to the deficits listed below (see PT Problem List). Pt will benefit from acute skilled PT to increase their independence and safety with mobility to allow discharge.          Recommendations for follow up therapy are one component of a multi-disciplinary discharge planning process, led by the attending physician.  Recommendations may be updated based on patient status, additional functional criteria and insurance authorization.  Follow Up Recommendations Can patient physically be transported by private vehicle: No     Assistance Recommended at Discharge Frequent or constant Supervision/Assistance  Patient can return home with the following  A lot of help with walking and/or transfers;A lot of help with bathing/dressing/bathroom;Assist for transportation    Equipment Recommendations None recommended by PT  Recommendations for Other Services       Functional Status Assessment Patient has had a recent decline in their functional status and demonstrates the ability to make significant  improvements in function in a reasonable and predictable amount of time.     Precautions / Restrictions Precautions Precautions: Fall Restrictions Weight Bearing Restrictions: Yes RLE Weight Bearing: Weight bearing as tolerated      Mobility  Bed Mobility               General bed mobility comments: pt up on BSC when PT entered    Transfers Overall transfer level: Needs assistance Equipment used: Rolling walker (2 wheels) Transfers: Sit to/from Stand, Bed to chair/wheelchair/BSC Sit to Stand: Min assist   Step pivot transfers: Min guard       General transfer comment: min A to stand from [facility], performed multiple times for bowel and bladder cleanup. Has difficulty shifting wt to R to step LLE with step pivot.    Ambulation/Gait               General Gait Details: deferred due to pt having bowel mvmt when she moves due to lactulose  Stairs            Wheelchair Mobility    Modified Rankin (Stroke Patients Only)       Balance Overall balance assessment: Needs assistance Sitting-balance support: Feet supported Sitting balance-Leahy Scale: Fair     Standing balance support: Bilateral upper extremity supported, During functional activity, Reliant on assistive device for balance Standing balance-Leahy Scale: Poor Standing balance comment: relying on rolling walker for support, min guard for safety                             Pertinent Vitals/Pain Pain Assessment Pain Assessment: Faces Faces Pain Scale: Hurts  a little bit Pain Location: R hip and hemorrhoids Pain Descriptors / Indicators: Discomfort Pain Intervention(s): Limited activity within patient's tolerance, Monitored during session    Home Living Family/patient expects to be discharged to:: Skilled nursing facility Living Arrangements: Spouse/significant other Available Help at Discharge: Family;Available 24 hours/day Type of Home: House Home Access: Stairs to  enter Entrance Stairs-Rails: Right;Left;Can reach both Entrance Stairs-Number of Steps: 3-4 steps side entrance. 5 steps back entrance   Home Layout: One level Home Equipment: None      Prior Function Prior Level of Function : Needs assist             Mobility Comments: Independent prior to 5/20 IM nailing of R hip; now working with SNF rehab using RW for mobility ADLs Comments: prior to 5/20 IM nailing of R hip fx, pt Independent with ADLs, IADLs. Now working with therapy at [facility] rehab -making progress though some assist still needed for ADLs.     Hand Dominance   Dominant Hand: Right    Extremity/Trunk Assessment   Upper Extremity Assessment Upper Extremity Assessment: Generalized weakness    Lower Extremity Assessment Lower Extremity Assessment: Generalized weakness;RLE deficits/detail RLE Deficits / Details: hip flex 2-/5, knee ext 2+/5, knee flex 2+/5 RLE Sensation: WNL RLE Coordination: decreased gross motor    Cervical / Trunk Assessment Cervical / Trunk Assessment: Normal  Communication   Communication: HOH (pt lip reads)  Cognition Arousal/Alertness: Awake/alert Behavior During Therapy: WFL for tasks assessed/performed, Flat affect Overall Cognitive Status: Difficult to assess Area of Impairment: Memory, Safety/judgement, Awareness, Problem solving                   Current Attention Level: Sustained Memory: Decreased short-term memory Following Commands: Follows one step commands with increased time Safety/Judgement: Decreased awareness of safety, Decreased awareness of deficits Awareness: Emergent Problem Solving: Slow processing, Decreased initiation, Difficulty sequencing, Requires verbal cues, Requires tactile cues General Comments: pleasant, difficult to fully assess cognition due to [facility] but follows commands consistently. shows insight into reason for hospitalization. minor confusion and mild memory deficits noted - can relay what she  was doing at SNF with PT but not what has happened the past couple of days        General Comments General comments (skin integrity, edema, etc.): session significantly limited by incontinence of bowel and bladder, pt also relays hemorrhoids being flared from this    Exercises Total Joint Exercises Knee Flexion: AROM, Right, 10 reps, Standing Standing Hip Extension: AROM, Right, 10 reps, Standing General Exercises - Lower Extremity Ankle Circles/Pumps: AROM, Both, 10 reps, Seated Quad Sets: AROM, Right, 10 reps, Seated Long Arc Quad: AAROM, Right, 10 reps, Seated Straight Leg Raises: AAROM, Right, 10 reps, Seated Hip Flexion/Marching: AAROM, Right, 10 reps, Seated, Standing Toe Raises: AROM, Both, 10 reps, Seated Heel Raises: AROM, Both, 10 reps, Seated   Assessment/Plan    PT Assessment Patient needs continued PT services  PT Problem List Decreased strength;Decreased range of motion;Decreased activity tolerance;Decreased balance;Decreased mobility;Decreased safety awareness;Decreased knowledge of use of DME;Decreased cognition;Decreased coordination;Pain;Decreased skin integrity       PT Treatment Interventions DME instruction;Gait training;Therapeutic exercise;Balance training;Stair training;Functional mobility training;Therapeutic activities;Patient/family education;Cognitive remediation;Neuromuscular re-education    PT Goals (Current goals can be found in the Care Plan section)  Acute Rehab PT Goals Patient Stated Goal: to get better PT Goal Formulation: With patient Time For Goal Achievement: 01/27/23 Potential to Achieve Goals: Good    Frequency Min 3X/week  Co-evaluation               AM-PAC PT "6 Clicks" Mobility  Outcome Measure Help needed turning from your back to your side while in a flat bed without using bedrails?: A Little Help needed moving from lying on your back to sitting on the side of a flat bed without using bedrails?: A Little Help needed  moving to and from a bed to a chair (including a wheelchair)?: A Little Help needed standing up from a chair using your arms (e.g., wheelchair or bedside chair)?: A Little Help needed to walk in hospital room?: Total Help needed climbing 3-5 steps with a railing? : Total 6 Click Score: 14    End of Session Equipment Utilized During Treatment: Gait belt Activity Tolerance: Patient tolerated treatment well Patient left: in chair;with call bell/phone within reach;with chair alarm set Nurse Communication: Mobility status PT Visit Diagnosis: Unsteadiness on feet (R26.81);Other abnormalities of gait and mobility (R26.89);Muscle weakness (generalized) (M62.81);History of falling (Z91.81);Difficulty in walking, not elsewhere classified (R26.2);Pain Pain - Right/Left: Right Pain - part of body: Hip    Time: 1610-9604 PT Time Calculation (min) (ACUTE ONLY): 41 min   Charges:   PT Evaluation $PT Eval Moderate Complexity: 1 Mod PT Treatments $Therapeutic Exercise: 8-22 mins $Therapeutic Activity: 8-22 mins        Lyanne Co, PT  Acute Rehab Services Secure chat preferred Office [phone]   Lawana Chambers Sadeen Wiegel 01/13/2023, 4:06 PM

## 2023-01-13 NOTE — Progress Notes (Signed)
[facility]                                                                               Denise Klein, is a 72 y.o. female, DOB - 05-Jul-1951, WGN:[id number] Admit date - 01/11/2023    Outpatient Primary MD for the patient is Lupita Raider, MD  LOS - 2  days    Brief summary   Denise Klein is a 72 y.o. female with medical history significant for recent right total hip arthroplasty, liver cirrhosis secondary to NASH, thrombocytopenia, hypertension, obesity who presents to the emergency room via EMS for evaluation of altered mental status.  T scan of the head without contrast showed no acute intracranial abnormalities. Chronic cerebral atrophy and small vessel ischemic changes. Chest x-ray reviewed by me shows shallow inspiration. Probable chronic bronchitic changes in the lungs. No focal consolidation. Twelve-lead EKG reviewed by me shows sinus rhythm with LVH. Patient received a dose of lactulose in the ER and was admitted to the hospital for further evaluation.  Assessment & Plan    Assessment and Plan: * Acute metabolic encephalopathy  Suspect from hepatic encephalopathy.  Has a known history of liver cirrhosis secondary to [facility] and has elevated ammonia levels on admission.  Increased lactulose 20 mg tid.  Ammonia level wnl.   Liver cirrhosis secondary to NASH [facility]) With complications of thrombocytopenia Increase lactulose to 20 mg TID.   Type 2 diabetes mellitus without complication, with long-term current use of insulin (HCC) CBG (last 3)  Recent Labs    01/12/23 1639 01/12/23 2102 01/13/23 0625  GLUCAP 297* 260* 169*    Continue with SSI. Continue with SEMGLEE 40 units daily, and SSI,  continue with Novolog  3 units TIDAC.   Essential hypertension Better controlled.  Continue with metoprolol 100 mg daily and Norvasc 5 mg daily.  Hold hydrochlorothiazide.   Closed right hip fracture (HCC) Following a mechanical fall Status post  intramedullary nailing on 12/29/22 and discharged to rehab  Therapy evaluations ordered.  Fall precautions   Thrombocytopenia (HCC) Secondary to known liver cirrhosis No evidence of bleeding Monitor platelet count closely.  Mild hyponatremia:  Monitor.    Anemia of chronic disease:  Hemoglobin around 9.    Body mass index is 31.41 kg/m. Obesity.    Estimated body mass index is 31.41 kg/m as calculated from the following:   Height as of this encounter: 5\' 4"  (1.626 m).   Weight as of this encounter: 83 kg.  Code Status: full code.  DVT Prophylaxis:  enoxaparin (LOVENOX) injection 40 mg Start: 01/12/23 1000   Level of Care: Level of care: Med-Surg Family Communication: none at bedside.   Disposition Plan:     Remains inpatient appropriate:  stable for discharge back to SNF.    Procedures:  None.   Consultants:   None.   Antimicrobials:   Anti-infectives (From admission, onward)    None        Medications  Scheduled Meds:  amLODipine  5 mg Oral Daily   aspirin EC  81 mg Oral BID   colesevelam  1,875 mg Oral BID WC   enoxaparin (LOVENOX) injection  40 mg Subcutaneous  Daily   feeding supplement (GLUCERNA SHAKE)  237 mL Oral BID BM   Gerhardt's butt cream   Topical BID   insulin aspart  0-15 Units Subcutaneous TID WC   insulin aspart  3 Units Subcutaneous TID WC   insulin glargine-yfgn  40 Units Subcutaneous Daily   lactulose  20 g Oral TID   loratadine  10 mg Oral Daily   metoprolol succinate  100 mg Oral Daily   senna-docusate  2 tablet Oral BID   Continuous Infusions:   PRN Meds:.acetaminophen **OR** acetaminophen, ondansetron **OR** ondansetron (ZOFRAN) IV, polyethylene glycol    Subjective:   Usha Brookens was seen and examined today.  No new complaints.  She is alert and oriented.   Objective:   Vitals:   01/12/23 2002 01/13/23 0010 01/13/23 0838 01/13/23 1242  BP: (!) 138/50 (!) 153/50 (!) 151/51 (!) 149/48  Pulse: 81 82 81 76   Resp: 17 16 20 17   Temp: 98.6 F (37 C) 99.3 F (37.4 C) 98.1 F (36.7 C) 97.9 F (36.6 C)  TempSrc: Oral Oral Oral Oral  SpO2: 99% 98% 100% 100%  Weight:      Height:        Intake/Output Summary (Last 24 hours) at 01/13/2023 1355 Last data filed at 01/13/2023 1244 Gross per 24 hour  Intake 477 ml  Output 250 ml  Net 227 ml    Filed Weights   01/11/23 1906  Weight: 83 kg     Exam General exam: Appears calm and comfortable  Respiratory system: Clear to auscultation. Respiratory effort normal. Cardiovascular system: S1 & S2 heard, RRR. No JVD,  Gastrointestinal system: Abdomen is nondistended, soft and nontender.  Central nervous system: Alert and oriented. Grossly non focal.  Extremities: no pedal edema.  Skin: No rashes,  Psychiatry: Mood & affect appropriate.      Data Reviewed:  I have personally reviewed following labs and imaging studies   CBC Lab Results  Component Value Date   WBC 5.9 01/13/2023   RBC 2.88 (L) 01/13/2023   HGB 9.1 (L) 01/13/2023   HCT 27.3 (L) 01/13/2023   MCV 94.8 01/13/2023   MCH 31.6 01/13/2023   PLT 103 (L) 01/13/2023   MCHC 33.3 01/13/2023   RDW 17.9 (H) 01/13/2023   LYMPHSABS 0.9 01/13/2023   MONOABS 0.7 01/13/2023   EOSABS 0.2 01/13/2023   BASOSABS 0.0 01/13/2023     Last metabolic panel Lab Results  Component Value Date   NA 133 (L) 01/13/2023   K 4.2 01/13/2023   CL 105 01/13/2023   CO2 22 01/13/2023   BUN 14 01/13/2023   CREATININE 0.77 01/13/2023   GLUCOSE 285 (H) 01/13/2023   GFRNONAA >60 01/13/2023   GFRAA >60 11/17/2019   CALCIUM 8.9 01/13/2023   PROT 5.6 (L) 01/11/2023   ALBUMIN 2.5 (L) 01/11/2023   BILITOT 1.5 (H) 01/11/2023   ALKPHOS 109 01/11/2023   AST 35 01/11/2023   ALT 28 01/11/2023   ANIONGAP 6 01/13/2023    CBG (last 3)  Recent Labs    01/12/23 1639 01/12/23 2102 01/13/23 0625  GLUCAP 297* 260* 169*       Coagulation Profile: No results for input(s): "INR", "PROTIME" in the  last 168 hours.   Radiology Studies: CT HEAD WO CONTRAST ( )  Result Date: 01/11/2023 CLINICAL DATA:  Minor head trauma. Acute neurological deficit with stroke suspected. Intermittent loss of consciousness. Hip surgery 2 weeks ago. EXAM: CT HEAD WITHOUT CONTRAST TECHNIQUE: Contiguous axial images were  obtained from the base of the skull through the vertex without intravenous contrast. RADIATION DOSE REDUCTION: This exam was performed according to the departmental dose-optimization program which includes automated exposure control, adjustment of the mA and/or kV according to patient size and/or use of iterative reconstruction technique. COMPARISON:  None Available. FINDINGS: Brain: Diffuse cerebral atrophy. Ventricular dilatation consistent with central atrophy. Low-attenuation changes in the deep white matter consistent with small vessel ischemia. No abnormal extra-axial fluid collections. No mass effect or midline shift. Gray-white matter junctions are distinct. Basal cisterns are not effaced. No acute intracranial hemorrhage. Vascular: No hyperdense vessel or unexpected calcification. Skull: Normal. Negative for fracture or focal lesion. Sinuses/Orbits: No acute finding. Other: None. IMPRESSION: No acute intracranial abnormalities. Chronic cerebral atrophy and small vessel ischemic changes. Electronically Signed   By: Burman Nieves M.D.   On: 01/11/2023 21:53   DG Chest Portable 1 View  Result Date: 01/11/2023 CLINICAL DATA:  Shortness of breath. Intermittent loss of consciousness. EXAM: PORTABLE CHEST 1 VIEW COMPARISON:  12/29/2022 FINDINGS: Shallow inspiration. Heart size and pulmonary vascularity are normal for technique. No airspace disease or consolidation in the lungs. No pleural effusions. No pneumothorax. Mediastinal contours appear intact. Central interstitial changes may indicate chronic bronchitis. Degenerative changes in the spine and shoulders. IMPRESSION: Shallow inspiration. Probable  chronic bronchitic changes in the lungs. No focal consolidation. Electronically Signed   By: Burman Nieves M.D.   On: 01/11/2023 20:26       Kathlen Mody M.D. [facility] 01/13/2023, 1:55 PM  Available via Epic secure chat 7am-7pm After 7 pm, please refer to night coverage provider listed on amion.

## 2023-01-14 DIAGNOSIS — S72141A Displaced intertrochanteric fracture of right femur, initial encounter for closed fracture: Secondary | ICD-10-CM | POA: Diagnosis not present

## 2023-01-14 DIAGNOSIS — I10 Essential (primary) hypertension: Secondary | ICD-10-CM | POA: Diagnosis not present

## 2023-01-14 DIAGNOSIS — S72001P Fracture of unspecified part of neck of right femur, subsequent encounter for closed fracture with malunion: Secondary | ICD-10-CM | POA: Diagnosis not present

## 2023-01-14 DIAGNOSIS — R531 Weakness: Secondary | ICD-10-CM | POA: Diagnosis not present

## 2023-01-14 DIAGNOSIS — F329 Major depressive disorder, single episode, unspecified: Secondary | ICD-10-CM | POA: Diagnosis not present

## 2023-01-14 DIAGNOSIS — S72141D Displaced intertrochanteric fracture of right femur, subsequent encounter for closed fracture with routine healing: Secondary | ICD-10-CM | POA: Diagnosis not present

## 2023-01-14 DIAGNOSIS — K7682 Hepatic encephalopathy: Secondary | ICD-10-CM

## 2023-01-14 DIAGNOSIS — Z794 Long term (current) use of insulin: Secondary | ICD-10-CM | POA: Diagnosis not present

## 2023-01-14 DIAGNOSIS — K7581 Nonalcoholic steatohepatitis (NASH): Secondary | ICD-10-CM | POA: Diagnosis not present

## 2023-01-14 DIAGNOSIS — Z7401 Bed confinement status: Secondary | ICD-10-CM | POA: Diagnosis not present

## 2023-01-14 DIAGNOSIS — G9341 Metabolic encephalopathy: Secondary | ICD-10-CM | POA: Diagnosis not present

## 2023-01-14 DIAGNOSIS — D649 Anemia, unspecified: Secondary | ICD-10-CM | POA: Diagnosis not present

## 2023-01-14 DIAGNOSIS — Z4789 Encounter for other orthopedic aftercare: Secondary | ICD-10-CM | POA: Diagnosis not present

## 2023-01-14 DIAGNOSIS — K7460 Unspecified cirrhosis of liver: Secondary | ICD-10-CM | POA: Diagnosis not present

## 2023-01-14 DIAGNOSIS — E119 Type 2 diabetes mellitus without complications: Secondary | ICD-10-CM | POA: Diagnosis not present

## 2023-01-14 DIAGNOSIS — E1169 Type 2 diabetes mellitus with other specified complication: Secondary | ICD-10-CM | POA: Diagnosis not present

## 2023-01-14 DIAGNOSIS — Z9181 History of falling: Secondary | ICD-10-CM | POA: Diagnosis not present

## 2023-01-14 LAB — GLUCOSE, CAPILLARY: Glucose-Capillary: 172 mg/dL — ABNORMAL HIGH (ref 70–99)

## 2023-01-14 MED ORDER — WITCH HAZEL-GLYCERIN EX PADS: MEDICATED_PAD | CUTANEOUS | 12 refills | Status: AC | PRN

## 2023-01-14 MED ORDER — LACTULOSE 10 GM/15ML PO SOLN: 20.0000 g | Freq: Three times a day (TID) | ORAL | 0 refills | Status: AC

## 2023-01-14 MED ORDER — OXYCODONE HCL 5 MG PO TABS: 5.0000 mg | ORAL_TABLET | Freq: Four times a day (QID) | ORAL | 0 refills | Status: AC | PRN

## 2023-01-14 MED ORDER — TRESIBA FLEXTOUCH 100 UNIT/ML ~~LOC~~ SOPN: 40.0000 [IU] | PEN_INJECTOR | Freq: Every day | SUBCUTANEOUS | Status: AC

## 2023-01-14 MED ORDER — GERHARDT'S BUTT CREAM: 1.0000 | TOPICAL_CREAM | Freq: Two times a day (BID) | CUTANEOUS | Status: AC

## 2023-01-14 MED ORDER — ACETAMINOPHEN 325 MG PO TABS: 650.0000 mg | ORAL_TABLET | Freq: Four times a day (QID) | ORAL | Status: AC | PRN

## 2023-01-14 MED ORDER — INSULIN ASPART 100 UNIT/ML IJ SOLN: 5.0000 [IU] | Freq: Three times a day (TID) | INTRAMUSCULAR | 11 refills | Status: AC

## 2023-01-14 MED ORDER — AMLODIPINE BESYLATE 5 MG PO TABS: 5.0000 mg | ORAL_TABLET | Freq: Every day | ORAL | 1 refills | Status: AC

## 2023-01-14 MED ORDER — HYDROCORTISONE (PERIANAL) 2.5 % EX CREA: TOPICAL_CREAM | Freq: Three times a day (TID) | CUTANEOUS | 0 refills | Status: AC

## 2023-01-14 NOTE — Discharge Summary (Signed)
Physician Discharge Summary  CHING LAWRENZ ZOX:[id number] DOB: 1951/03/25 DOA: 01/11/2023  PCP: Lupita Raider, MD  Admit date: 01/11/2023 Discharge date: 01/14/2023  Time spent: 60 minutes  Recommendations for Outpatient Follow-up:  Follow-up with MD at SNF.  Patient's diabetes will need to be reassessed and insulin uptitrated as needed for better blood glucose control.  Patient will need a basic metabolic profile, CBC done in 1 week to follow-up on electrolytes and renal function.     Discharge Diagnoses:  Principal Problem:   Acute metabolic encephalopathy Active Problems:   Liver cirrhosis secondary to NASH (HCC)   Type 2 diabetes mellitus without complication, with long-term current use of insulin (HCC)   Essential hypertension   Thrombocytopenia (HCC)   Closed right hip fracture (HCC)   Acute hepatic encephalopathy (HCC)   Discharge Condition: Stable and improved.  Diet recommendation: Heart healthy  Filed Weights   01/11/23 1906  Weight: 83 kg    History of present illness:  HPI per Dr. Joylene Igo Most of the history was obtained from patient's daughter-in-law who is at the bedside. HPI: Denise Klein is a 72 y.o. female with medical history significant for recent right total hip arthroplasty, liver cirrhosis secondary to NASH, thrombocytopenia, hypertension, obesity who presents to the emergency room via EMS for evaluation of altered mental status. Per her daughter-in-law who provides the history, patient was in her usual state of health until this morning when she became confused.  She was unable to feed herself and required assistance with her meals which is unusual for her.  She was also noted to be in a lot of pain and received a dose of oxycodone.  Due to progressively worsening mental status changes and confusion she was brought into the ER for evaluation. Per her daughter-in-law she has been compliant with her lactulose but has had issues with constipation. During  my assessment she is oriented to person and place but not to time.  She has no complaints. Abnormal labs include ammonia of 94, glucose of 227, lipase 65 CT scan of the head without contrast showed no acute intracranial abnormalities. Chronic cerebral atrophy and small vessel ischemic changes. Chest x-ray reviewed by me shows shallow inspiration. Probable chronic bronchitic changes in the lungs. No focal consolidation. Twelve-lead EKG reviewed by me shows sinus rhythm with LVH. Patient received a dose of lactulose in the ER and will be admitted to the hospital for further evaluation.   Hospital Course:  #1 acute metabolic encephalopathy likely secondary to acute hepatic encephalopathy -Patient had presented from nursing facility due to altered mental status.  Patient with history of liver cirrhosis secondary to [place]. -Patient seen in the ED noted to have an elevated ammonia levels of 94, glucose of 227, lipase of 65. -Patient was noted on admission to have been compliant with lactulose at facility however was not having regular bowel movements. -Patient placed on lactulose and dose increased to 20 g 3 times daily and to titrate for approximately 3-5 loose stools a day. -Patient improved clinically, ammonia levels trended down and patient's mental status was back to baseline by day of discharge. -Patient will be discharged on increased dose of lactulose of 20 g 3 times daily.  2.  Liver cirrhosis secondary to NASH -Patient with noted complications of thrombocytopenia. -Patient had no bleeding during the hospitalization. -Lactulose dose was increased to 20 mg 3 times daily. -Outpatient follow-up.  3.  Type 2 diabetes mellitus without complication, long-term current use of insulin, POA -Patient  maintained on long-acting insulin of Semglee and dose adjusted to 40 units daily in addition to SSI.  Patient also maintained on NovoLog meal coverage 3 units 3 times daily with meals.  Patient's metformin  was held. -Blood glucose levels improved during the hospitalization patient be discharged on a decreased dose of long-acting insulin of 40 units daily, on home regimen of meal coverage NovoLog at 5 units 3 times daily with meals and dose uptitrated as needed for better blood glucose control in addition to resumption of patient's metformin. -Outpatient follow-up.  4.  Hypertension -Patient's home regimen Norvasc was increased to 5 mg daily from 2.5 daily and patient maintained on home regimen metoprolol.  HCTZ was held and discontinued during the hospitalization. -Outpatient follow-up.  5.  Closed right hip fracture -During last hospitalization patient admitted with a closed right hip fracture secondary to mechanical fall, status post IM nailing 12/29/2022 and discharged to rehab. -Patient seen by PT/OT. -Outpatient follow-up with orthopedics as previously scheduled.  6.  Thrombocytopenia -Secondary to known liver cirrhosis. -Patient with no evidence of bleeding during the hospitalization. -Platelet function stabilized at 103 by day of discharge.  7.  Mild hyponatremia -Remained stable.  8.  Anemia of chronic disease -Patient noted with no overt bleeding. -Hemoglobin remained stable at 9.1 by day of discharge. -Outpatient follow-up.  Procedures: CT head 01/11/2023 Chest x-ray 01/11/2023   Consultations: None  Discharge Exam: Vitals:   01/14/23 0809 01/14/23 1127  BP: (!) 131/42 (!) 133/44  Pulse: 81 74  Resp: 18 18  Temp: 98.7 F (37.1 C) 98.7 F (37.1 C)  SpO2: 97% 99%    General: NAD Cardiovascular: RRR no murmurs rubs or gallops.  No JVD.  No lower extremity edema. Respiratory: Clear to auscultation bilaterally.  No wheezes, no crackles, no rhonchi.  Fair air movement.  Speaking in full sentences.  Discharge Instructions   Discharge Instructions     Diet - low sodium heart healthy   Complete by: As directed    Increase activity slowly   Complete by: As directed     No wound care   Complete by: As directed       Allergies as of 01/14/2023       Reactions   Sulfamethoxazole-trimethoprim Other (See Comments), Rash   Acetaminophen Other (See Comments)   Liver affected   Ibuprofen Other (See Comments)   Liver affected   Meloxicam Other (See Comments)   Molds & Smuts    Naproxen Sodium Other (See Comments)   Liver affected   Pentazocine Other (See Comments)   hallucinations   Propoxyphene Swelling   Statins Other (See Comments)   Lisinopril Other (See Comments), Nausea And Vomiting   Penicillin G Rash   Sulfamethoxazole Rash        Medication List     STOP taking these medications    docusate sodium 100 MG capsule Commonly known as: COLACE   hydrochlorothiazide 25 MG tablet Commonly known as: HYDRODIURIL       TAKE these medications    Accu-Chek Aviva Plus test strip Generic drug: glucose blood   acetaminophen 325 MG tablet Commonly known as: TYLENOL Take 2 tablets (650 mg total) by mouth every 6 (six) hours as needed for mild pain (or Fever >/= 101).   amLODipine 5 MG tablet Commonly known as: NORVASC Take 1 tablet (5 mg total) by mouth daily. What changed:  medication strength how much to take   aspirin EC 81 MG tablet Take 1 tablet (  81 mg total) by mouth 2 (two) times daily. Swallow whole.   cetirizine 10 MG tablet Commonly known as: ZYRTEC Take 10 mg by mouth daily.   colesevelam 625 MG tablet Commonly known as: WELCHOL Take 3 tablets by mouth 2 (two) times daily.   feeding supplement Liqd Take 237 mLs by mouth 2 (two) times daily between meals.   Gerhardt's butt cream Crea Apply 1 Application topically 2 (two) times daily.   hydrocortisone 2.5 % rectal cream Commonly known as: ANUSOL-HC Place rectally 3 (three) times daily for 10 days.   hydrocortisone 25 MG suppository Commonly known as: ANUSOL-HC Place 25 mg rectally 2 (two) times daily.   insulin aspart 100 UNIT/ML injection Commonly known  as: novoLOG Inject 5 Units into the skin 3 (three) times daily with meals. What changed: how much to take   lactulose 10 GM/15ML solution Commonly known as: CHRONULAC Take 30 mLs (20 g total) by mouth 3 (three) times daily. What changed:  how much to take when to take this   metFORMIN 500 MG 24 hr tablet Commonly known as: GLUCOPHAGE-XR Take 500 mg by mouth daily with breakfast.   metoprolol succinate 100 MG 24 hr tablet Commonly known as: TOPROL-XL Take 1 tablet (100 mg total) by mouth daily.   oxyCODONE 5 MG immediate release tablet Commonly known as: Roxicodone Take 1 tablet (5 mg total) by mouth every 6 (six) hours as needed for severe pain. What changed:  when to take this reasons to take this   Tresiba FlexTouch 100 UNIT/ML FlexTouch Pen Generic drug: insulin degludec Inject 40 Units into the skin daily. What changed: how much to take   witch hazel-glycerin pad Commonly known as: TUCKS Apply topically as needed for itching.       Allergies  Allergen Reactions   Sulfamethoxazole-Trimethoprim Other (See Comments) and Rash   Acetaminophen Other (See Comments)    Liver affected   Ibuprofen Other (See Comments)    Liver affected   Meloxicam Other (See Comments)   Molds & Smuts    Naproxen Sodium Other (See Comments)    Liver affected   Pentazocine Other (See Comments)    hallucinations   Propoxyphene Swelling   Statins Other (See Comments)   Lisinopril Other (See Comments) and Nausea And Vomiting   Penicillin G Rash   Sulfamethoxazole Rash    Contact information for follow-up providers     MD AT SNF Follow up.               Contact information for after-discharge care     Destination     [facility] [place] Preferred SNF .   Service: Skilled Nursing Contact information: [address] [place] [address] [phone]                      The results of significant diagnostics from this  hospitalization (including imaging, microbiology, ancillary and laboratory) are listed below for reference.    Significant Diagnostic Studies: CT HEAD WO CONTRAST ( )  Result Date: 01/11/2023 CLINICAL DATA:  Minor head trauma. Acute neurological deficit with stroke suspected. Intermittent loss of consciousness. Hip surgery 2 weeks ago. EXAM: CT HEAD WITHOUT CONTRAST TECHNIQUE: Contiguous axial images were obtained from the base of the skull through the vertex without intravenous contrast. RADIATION DOSE REDUCTION: This exam was performed according to the departmental dose-optimization program which includes automated exposure control, adjustment of the mA and/or kV according to patient size and/or use  of iterative reconstruction technique. COMPARISON:  None Available. FINDINGS: Brain: Diffuse cerebral atrophy. Ventricular dilatation consistent with central atrophy. Low-attenuation changes in the deep white matter consistent with small vessel ischemia. No abnormal extra-axial fluid collections. No mass effect or midline shift. Gray-white matter junctions are distinct. Basal cisterns are not effaced. No acute intracranial hemorrhage. Vascular: No hyperdense vessel or unexpected calcification. Skull: Normal. Negative for fracture or focal lesion. Sinuses/Orbits: No acute finding. Other: None. IMPRESSION: No acute intracranial abnormalities. Chronic cerebral atrophy and small vessel ischemic changes. Electronically Signed   By: Burman Nieves M.D.   On: 01/11/2023 21:53   DG Chest Portable 1 View  Result Date: 01/11/2023 CLINICAL DATA:  Shortness of breath. Intermittent loss of consciousness. EXAM: PORTABLE CHEST 1 VIEW COMPARISON:  12/29/2022 FINDINGS: Shallow inspiration. Heart size and pulmonary vascularity are normal for technique. No airspace disease or consolidation in the lungs. No pleural effusions. No pneumothorax. Mediastinal contours appear intact. Central interstitial changes may indicate chronic  bronchitis. Degenerative changes in the spine and shoulders. IMPRESSION: Shallow inspiration. Probable chronic bronchitic changes in the lungs. No focal consolidation. Electronically Signed   By: Burman Nieves M.D.   On: 01/11/2023 20:26   DG FEMUR, MIN 2 VIEWS RIGHT  Result Date: 12/29/2022 CLINICAL DATA:  Right hip fracture status post ORIF EXAM: RIGHT FEMUR 2 VIEWS COMPARISON:  12/29/2022 FINDINGS: 6 fluoroscopic images are obtained during the performance of the procedure and are provided for interpretation only. Images demonstrate inter trochanteric right hip fracture, with subsequent placement of an intramedullary rod with proximal dynamic and distal interlocking screw. Alignment is anatomic. Fluoroscopy time: 46 seconds, 9.31 mGy IMPRESSION: 1. ORIF of an intertrochanteric right hip fracture as above. Anatomic alignment. Electronically Signed   By: Sharlet Salina M.D.   On: 12/29/2022 17:09   DG C-Arm 1-60 Min-No Report  Result Date: 12/29/2022 Fluoroscopy was utilized by the requesting physician.  No radiographic interpretation.   DG Hip Unilat  With Pelvis 2-3 Views Right  Result Date: 12/29/2022 CLINICAL DATA:  Fall. EXAM: DG HIP (WITH OR WITHOUT PELVIS) 2-3V RIGHT COMPARISON:  None Available. FINDINGS: Moderately displaced and comminuted fracture is seen involving intertrochanteric portion of right proximal femur. IMPRESSION: Moderately displaced and comminuted intertrochanteric fracture of proximal right femur. Electronically Signed   By: Lupita Raider M.D.   On: 12/29/2022 11:34   DG Chest Port 1 View  Result Date: 12/29/2022 CLINICAL DATA:  Fall. EXAM: PORTABLE CHEST 1 VIEW COMPARISON:  May 07, 2022. FINDINGS: Stable cardiomediastinal silhouette. Both lungs are clear. The visualized skeletal structures are unremarkable. IMPRESSION: No active disease. Electronically Signed   By: Lupita Raider M.D.   On: 12/29/2022 11:32    Microbiology: No results found for this or any  previous visit (from the past 240 hour(s)).   Labs: Basic Metabolic Panel: Recent Labs  Lab 01/11/23 1920 01/12/23 0752 01/13/23 0913  NA 136 140 133*  K 4.6 4.2 4.2  CL 108 113* 105  CO2 20* 19* 22  GLUCOSE 227* 161* 285*  BUN 17 13 14   CREATININE 0.74 0.64 0.77  CALCIUM 8.7* 8.6* 8.9   Liver Function Tests: Recent Labs  Lab 01/11/23 1920  AST 35  ALT 28  ALKPHOS 109  BILITOT 1.5*  PROT 5.6*  ALBUMIN 2.5*   Recent Labs  Lab 01/11/23 1920 01/12/23 1012  LIPASE 65* 62*   Recent Labs  Lab 01/11/23 1920 01/12/23 1012 01/13/23 0913  AMMONIA 94* 60* 19   CBC: Recent  Labs  Lab 01/11/23 1920 01/12/23 0752 01/13/23 0913  WBC 6.9 4.6 5.9  NEUTROABS 4.3  --  4.1  HGB 9.3* 8.6* 9.1*  HCT 27.7* 25.9* 27.3*  MCV 95.8 94.5 94.8  PLT 105* 101* 103*   Cardiac Enzymes: No results for input(s): "CKTOTAL", "CKMB", "CKMBINDEX", "TROPONINI" in the last 168 hours. BNP: BNP (last 3 results) No results for input(s): "BNP" in the last 8760 hours.  ProBNP (last 3 results) No results for input(s): "PROBNP" in the last 8760 hours.  CBG: Recent Labs  Lab 01/13/23 0625 01/13/23 1417 01/13/23 1648 01/13/23 2108 01/14/23 0622  GLUCAP 169* 294* 205* 257* 172*       Signed:  Ramiro Harvest MD.  [facility] 01/14/2023, 12:37 PM

## 2023-01-14 NOTE — Care Management Important Message (Signed)
Important Message  Patient Details  Name: Denise Klein MRN: [id number] Date of Birth: [birth year]   Medicare Important Message Given:  Yes     Dorena Bodo 01/14/2023, 2:45 PM

## 2023-01-14 NOTE — Progress Notes (Signed)
Mobility Specialist Progress Note   01/14/23 1454  Mobility  Activity Ambulated with assistance in hallway;Transferred to/from [facility]  Level of Assistance Minimal assist, patient does 75% or more  Assistive Device Front wheel walker  Distance Ambulated (ft) 24 ft  RLE Weight Bearing WBAT  Activity Response Tolerated well  Mobility Referral Yes  $Mobility charge 1 Mobility  Mobility Specialist Start Time (ACUTE ONLY) 1412  Mobility Specialist Stop Time (ACUTE ONLY) 1450  Mobility Specialist Time Calculation (min) (ACUTE ONLY) 38 min   Received pt in chair requesting assistance to [facility] MinA to stand and place on [facility]. Successful void and BM. MinA to stand for pericare once finished. Ambulated to hallway w/o fault, session cut short d/t EMS coming to pick up pt. Assisted pt on to stretcher w/o fault, left w/ EMS staff.   Frederico Hamman Mobility Specialist Please contact via SecureChat or  Rehab office at [phone]'

## 2023-01-14 NOTE — TOC Transition Note (Signed)
Transition of Care [facility]) - CM/SW Discharge Note   Patient Details  Name: Denise Klein MRN: [id number] Date of Birth: [birth year]  [facility] CM/SW Contact:  Baldemar Lenis, LCSW Phone Number: 01/14/2023, 12:59 PM   Clinical Narrative:   CSW noting this morning that PT evaluation was completed. CSW requested insurance authorization with CMA, and authorization has been received. CSW notified MD, patient is stable for discharge. CSW confirmed with [facility] that bed is available. CSW sent discharge summary. Transport arranged with [facility] for next available. CSW notified spouse at bedside, who is in agreement.  Nurse to call report to [phone].    Final next level of care: Skilled Nursing Facility Barriers to Discharge: Barriers Resolved   Patient Goals and CMS Choice CMS Medicare.gov Compare Post Acute Care list provided to:: Patient Represenative (must comment) Choice offered to / list presented to : Spouse  Discharge Placement                Patient chooses bed at: [facility], [place] Patient to be transferred to facility by: [facility] Name of family member notified: Molly Maduro Patient and family notified of of transfer: 01/14/23  Discharge Plan and Services Additional resources added to the After Visit Summary for       Post Acute Care Choice: Skilled Nursing Facility                               Social Determinants of Health (SDOH) Interventions SDOH Screenings   Tobacco Use: Low Risk  (01/02/2023)     Readmission Risk Interventions     No data to display

## 2023-01-16 DIAGNOSIS — F329 Major depressive disorder, single episode, unspecified: Secondary | ICD-10-CM | POA: Diagnosis not present

## 2023-01-19 DIAGNOSIS — Z4789 Encounter for other orthopedic aftercare: Secondary | ICD-10-CM | POA: Diagnosis not present

## 2023-01-27 DIAGNOSIS — D6959 Other secondary thrombocytopenia: Secondary | ICD-10-CM | POA: Diagnosis not present

## 2023-01-27 DIAGNOSIS — E119 Type 2 diabetes mellitus without complications: Secondary | ICD-10-CM | POA: Diagnosis not present

## 2023-01-27 DIAGNOSIS — K7581 Nonalcoholic steatohepatitis (NASH): Secondary | ICD-10-CM | POA: Diagnosis not present

## 2023-01-27 DIAGNOSIS — K7469 Other cirrhosis of liver: Secondary | ICD-10-CM | POA: Diagnosis not present

## 2023-01-27 DIAGNOSIS — S72141D Displaced intertrochanteric fracture of right femur, subsequent encounter for closed fracture with routine healing: Secondary | ICD-10-CM | POA: Diagnosis not present

## 2023-01-27 DIAGNOSIS — I10 Essential (primary) hypertension: Secondary | ICD-10-CM | POA: Diagnosis not present

## 2023-01-27 DIAGNOSIS — G9341 Metabolic encephalopathy: Secondary | ICD-10-CM | POA: Diagnosis not present

## 2023-01-27 DIAGNOSIS — Z96649 Presence of unspecified artificial hip joint: Secondary | ICD-10-CM | POA: Diagnosis not present

## 2023-01-27 DIAGNOSIS — D649 Anemia, unspecified: Secondary | ICD-10-CM | POA: Diagnosis not present

## 2023-01-28 DIAGNOSIS — D6959 Other secondary thrombocytopenia: Secondary | ICD-10-CM | POA: Diagnosis not present

## 2023-01-28 DIAGNOSIS — E119 Type 2 diabetes mellitus without complications: Secondary | ICD-10-CM | POA: Diagnosis not present

## 2023-01-28 DIAGNOSIS — D649 Anemia, unspecified: Secondary | ICD-10-CM | POA: Diagnosis not present

## 2023-01-28 DIAGNOSIS — K7469 Other cirrhosis of liver: Secondary | ICD-10-CM | POA: Diagnosis not present

## 2023-01-28 DIAGNOSIS — I10 Essential (primary) hypertension: Secondary | ICD-10-CM | POA: Diagnosis not present

## 2023-01-28 DIAGNOSIS — S72141D Displaced intertrochanteric fracture of right femur, subsequent encounter for closed fracture with routine healing: Secondary | ICD-10-CM | POA: Diagnosis not present

## 2023-01-28 DIAGNOSIS — K7581 Nonalcoholic steatohepatitis (NASH): Secondary | ICD-10-CM | POA: Diagnosis not present

## 2023-01-28 DIAGNOSIS — Z96649 Presence of unspecified artificial hip joint: Secondary | ICD-10-CM | POA: Diagnosis not present

## 2023-01-28 DIAGNOSIS — G9341 Metabolic encephalopathy: Secondary | ICD-10-CM | POA: Diagnosis not present

## 2023-01-30 DIAGNOSIS — D6959 Other secondary thrombocytopenia: Secondary | ICD-10-CM | POA: Diagnosis not present

## 2023-01-30 DIAGNOSIS — I129 Hypertensive chronic kidney disease with stage 1 through stage 4 chronic kidney disease, or unspecified chronic kidney disease: Secondary | ICD-10-CM | POA: Diagnosis not present

## 2023-01-30 DIAGNOSIS — Z794 Long term (current) use of insulin: Secondary | ICD-10-CM | POA: Diagnosis not present

## 2023-01-30 DIAGNOSIS — K5900 Constipation, unspecified: Secondary | ICD-10-CM | POA: Diagnosis not present

## 2023-01-30 DIAGNOSIS — K7581 Nonalcoholic steatohepatitis (NASH): Secondary | ICD-10-CM | POA: Diagnosis not present

## 2023-01-30 DIAGNOSIS — Z96649 Presence of unspecified artificial hip joint: Secondary | ICD-10-CM | POA: Diagnosis not present

## 2023-01-30 DIAGNOSIS — G9341 Metabolic encephalopathy: Secondary | ICD-10-CM | POA: Diagnosis not present

## 2023-01-30 DIAGNOSIS — E119 Type 2 diabetes mellitus without complications: Secondary | ICD-10-CM | POA: Diagnosis not present

## 2023-01-30 DIAGNOSIS — G9340 Encephalopathy, unspecified: Secondary | ICD-10-CM | POA: Diagnosis not present

## 2023-01-30 DIAGNOSIS — D696 Thrombocytopenia, unspecified: Secondary | ICD-10-CM | POA: Diagnosis not present

## 2023-01-30 DIAGNOSIS — S72141D Displaced intertrochanteric fracture of right femur, subsequent encounter for closed fracture with routine healing: Secondary | ICD-10-CM | POA: Diagnosis not present

## 2023-01-30 DIAGNOSIS — M810 Age-related osteoporosis without current pathological fracture: Secondary | ICD-10-CM | POA: Diagnosis not present

## 2023-01-30 DIAGNOSIS — I10 Essential (primary) hypertension: Secondary | ICD-10-CM | POA: Diagnosis not present

## 2023-01-30 DIAGNOSIS — E1169 Type 2 diabetes mellitus with other specified complication: Secondary | ICD-10-CM | POA: Diagnosis not present

## 2023-01-30 DIAGNOSIS — K7469 Other cirrhosis of liver: Secondary | ICD-10-CM | POA: Diagnosis not present

## 2023-01-30 DIAGNOSIS — D638 Anemia in other chronic diseases classified elsewhere: Secondary | ICD-10-CM | POA: Diagnosis not present

## 2023-01-30 DIAGNOSIS — D649 Anemia, unspecified: Secondary | ICD-10-CM | POA: Diagnosis not present

## 2023-02-02 DIAGNOSIS — K7581 Nonalcoholic steatohepatitis (NASH): Secondary | ICD-10-CM | POA: Diagnosis not present

## 2023-02-04 DIAGNOSIS — K7581 Nonalcoholic steatohepatitis (NASH): Secondary | ICD-10-CM | POA: Diagnosis not present

## 2023-02-04 DIAGNOSIS — D6959 Other secondary thrombocytopenia: Secondary | ICD-10-CM | POA: Diagnosis not present

## 2023-02-04 DIAGNOSIS — Z96649 Presence of unspecified artificial hip joint: Secondary | ICD-10-CM | POA: Diagnosis not present

## 2023-02-04 DIAGNOSIS — S72141D Displaced intertrochanteric fracture of right femur, subsequent encounter for closed fracture with routine healing: Secondary | ICD-10-CM | POA: Diagnosis not present

## 2023-02-04 DIAGNOSIS — I10 Essential (primary) hypertension: Secondary | ICD-10-CM | POA: Diagnosis not present

## 2023-02-04 DIAGNOSIS — E119 Type 2 diabetes mellitus without complications: Secondary | ICD-10-CM | POA: Diagnosis not present

## 2023-02-04 DIAGNOSIS — D649 Anemia, unspecified: Secondary | ICD-10-CM | POA: Diagnosis not present

## 2023-02-04 DIAGNOSIS — K7469 Other cirrhosis of liver: Secondary | ICD-10-CM | POA: Diagnosis not present

## 2023-02-04 DIAGNOSIS — G9341 Metabolic encephalopathy: Secondary | ICD-10-CM | POA: Diagnosis not present

## 2023-02-06 DIAGNOSIS — D649 Anemia, unspecified: Secondary | ICD-10-CM | POA: Diagnosis not present

## 2023-02-06 DIAGNOSIS — G9341 Metabolic encephalopathy: Secondary | ICD-10-CM | POA: Diagnosis not present

## 2023-02-06 DIAGNOSIS — I10 Essential (primary) hypertension: Secondary | ICD-10-CM | POA: Diagnosis not present

## 2023-02-06 DIAGNOSIS — K7581 Nonalcoholic steatohepatitis (NASH): Secondary | ICD-10-CM | POA: Diagnosis not present

## 2023-02-06 DIAGNOSIS — D6959 Other secondary thrombocytopenia: Secondary | ICD-10-CM | POA: Diagnosis not present

## 2023-02-06 DIAGNOSIS — E119 Type 2 diabetes mellitus without complications: Secondary | ICD-10-CM | POA: Diagnosis not present

## 2023-02-06 DIAGNOSIS — K7469 Other cirrhosis of liver: Secondary | ICD-10-CM | POA: Diagnosis not present

## 2023-02-06 DIAGNOSIS — S72141D Displaced intertrochanteric fracture of right femur, subsequent encounter for closed fracture with routine healing: Secondary | ICD-10-CM | POA: Diagnosis not present

## 2023-02-06 DIAGNOSIS — Z96649 Presence of unspecified artificial hip joint: Secondary | ICD-10-CM | POA: Diagnosis not present

## 2023-02-09 DIAGNOSIS — S72141D Displaced intertrochanteric fracture of right femur, subsequent encounter for closed fracture with routine healing: Secondary | ICD-10-CM | POA: Diagnosis not present

## 2023-02-09 DIAGNOSIS — I10 Essential (primary) hypertension: Secondary | ICD-10-CM | POA: Diagnosis not present

## 2023-02-09 DIAGNOSIS — E119 Type 2 diabetes mellitus without complications: Secondary | ICD-10-CM | POA: Diagnosis not present

## 2023-02-09 DIAGNOSIS — G9341 Metabolic encephalopathy: Secondary | ICD-10-CM | POA: Diagnosis not present

## 2023-02-09 DIAGNOSIS — D6959 Other secondary thrombocytopenia: Secondary | ICD-10-CM | POA: Diagnosis not present

## 2023-02-09 DIAGNOSIS — D649 Anemia, unspecified: Secondary | ICD-10-CM | POA: Diagnosis not present

## 2023-02-09 DIAGNOSIS — K7581 Nonalcoholic steatohepatitis (NASH): Secondary | ICD-10-CM | POA: Diagnosis not present

## 2023-02-09 DIAGNOSIS — Z96649 Presence of unspecified artificial hip joint: Secondary | ICD-10-CM | POA: Diagnosis not present

## 2023-02-09 DIAGNOSIS — K7469 Other cirrhosis of liver: Secondary | ICD-10-CM | POA: Diagnosis not present

## 2023-02-10 DIAGNOSIS — K7581 Nonalcoholic steatohepatitis (NASH): Secondary | ICD-10-CM | POA: Diagnosis not present

## 2023-02-10 DIAGNOSIS — D649 Anemia, unspecified: Secondary | ICD-10-CM | POA: Diagnosis not present

## 2023-02-10 DIAGNOSIS — I10 Essential (primary) hypertension: Secondary | ICD-10-CM | POA: Diagnosis not present

## 2023-02-10 DIAGNOSIS — E119 Type 2 diabetes mellitus without complications: Secondary | ICD-10-CM | POA: Diagnosis not present

## 2023-02-10 DIAGNOSIS — K7469 Other cirrhosis of liver: Secondary | ICD-10-CM | POA: Diagnosis not present

## 2023-02-10 DIAGNOSIS — Z96649 Presence of unspecified artificial hip joint: Secondary | ICD-10-CM | POA: Diagnosis not present

## 2023-02-10 DIAGNOSIS — S72141D Displaced intertrochanteric fracture of right femur, subsequent encounter for closed fracture with routine healing: Secondary | ICD-10-CM | POA: Diagnosis not present

## 2023-02-10 DIAGNOSIS — G9341 Metabolic encephalopathy: Secondary | ICD-10-CM | POA: Diagnosis not present

## 2023-02-10 DIAGNOSIS — D6959 Other secondary thrombocytopenia: Secondary | ICD-10-CM | POA: Diagnosis not present

## 2023-02-11 DIAGNOSIS — Z96649 Presence of unspecified artificial hip joint: Secondary | ICD-10-CM | POA: Diagnosis not present

## 2023-02-11 DIAGNOSIS — K7469 Other cirrhosis of liver: Secondary | ICD-10-CM | POA: Diagnosis not present

## 2023-02-11 DIAGNOSIS — E119 Type 2 diabetes mellitus without complications: Secondary | ICD-10-CM | POA: Diagnosis not present

## 2023-02-11 DIAGNOSIS — I10 Essential (primary) hypertension: Secondary | ICD-10-CM | POA: Diagnosis not present

## 2023-02-11 DIAGNOSIS — S72141D Displaced intertrochanteric fracture of right femur, subsequent encounter for closed fracture with routine healing: Secondary | ICD-10-CM | POA: Diagnosis not present

## 2023-02-11 DIAGNOSIS — D6959 Other secondary thrombocytopenia: Secondary | ICD-10-CM | POA: Diagnosis not present

## 2023-02-11 DIAGNOSIS — G9341 Metabolic encephalopathy: Secondary | ICD-10-CM | POA: Diagnosis not present

## 2023-02-11 DIAGNOSIS — D649 Anemia, unspecified: Secondary | ICD-10-CM | POA: Diagnosis not present

## 2023-02-11 DIAGNOSIS — K7581 Nonalcoholic steatohepatitis (NASH): Secondary | ICD-10-CM | POA: Diagnosis not present

## 2023-02-16 DIAGNOSIS — K7460 Unspecified cirrhosis of liver: Secondary | ICD-10-CM | POA: Diagnosis not present

## 2023-02-16 DIAGNOSIS — K766 Portal hypertension: Secondary | ICD-10-CM | POA: Diagnosis not present

## 2023-02-16 DIAGNOSIS — K7581 Nonalcoholic steatohepatitis (NASH): Secondary | ICD-10-CM | POA: Diagnosis not present

## 2023-02-16 DIAGNOSIS — K3189 Other diseases of stomach and duodenum: Secondary | ICD-10-CM | POA: Diagnosis not present

## 2023-02-16 DIAGNOSIS — K7682 Hepatic encephalopathy: Secondary | ICD-10-CM | POA: Diagnosis not present

## 2023-02-16 DIAGNOSIS — E440 Moderate protein-calorie malnutrition: Secondary | ICD-10-CM | POA: Diagnosis not present

## 2023-02-17 DIAGNOSIS — D649 Anemia, unspecified: Secondary | ICD-10-CM | POA: Diagnosis not present

## 2023-02-17 DIAGNOSIS — S72141D Displaced intertrochanteric fracture of right femur, subsequent encounter for closed fracture with routine healing: Secondary | ICD-10-CM | POA: Diagnosis not present

## 2023-02-17 DIAGNOSIS — D6959 Other secondary thrombocytopenia: Secondary | ICD-10-CM | POA: Diagnosis not present

## 2023-02-17 DIAGNOSIS — Z96649 Presence of unspecified artificial hip joint: Secondary | ICD-10-CM | POA: Diagnosis not present

## 2023-02-17 DIAGNOSIS — G9341 Metabolic encephalopathy: Secondary | ICD-10-CM | POA: Diagnosis not present

## 2023-02-17 DIAGNOSIS — K7581 Nonalcoholic steatohepatitis (NASH): Secondary | ICD-10-CM | POA: Diagnosis not present

## 2023-02-17 DIAGNOSIS — E119 Type 2 diabetes mellitus without complications: Secondary | ICD-10-CM | POA: Diagnosis not present

## 2023-02-17 DIAGNOSIS — K7469 Other cirrhosis of liver: Secondary | ICD-10-CM | POA: Diagnosis not present

## 2023-02-17 DIAGNOSIS — I10 Essential (primary) hypertension: Secondary | ICD-10-CM | POA: Diagnosis not present

## 2023-02-18 DIAGNOSIS — K7469 Other cirrhosis of liver: Secondary | ICD-10-CM | POA: Diagnosis not present

## 2023-02-18 DIAGNOSIS — S72141D Displaced intertrochanteric fracture of right femur, subsequent encounter for closed fracture with routine healing: Secondary | ICD-10-CM | POA: Diagnosis not present

## 2023-02-18 DIAGNOSIS — G9341 Metabolic encephalopathy: Secondary | ICD-10-CM | POA: Diagnosis not present

## 2023-02-18 DIAGNOSIS — Z96649 Presence of unspecified artificial hip joint: Secondary | ICD-10-CM | POA: Diagnosis not present

## 2023-02-18 DIAGNOSIS — K7581 Nonalcoholic steatohepatitis (NASH): Secondary | ICD-10-CM | POA: Diagnosis not present

## 2023-02-18 DIAGNOSIS — D6959 Other secondary thrombocytopenia: Secondary | ICD-10-CM | POA: Diagnosis not present

## 2023-02-18 DIAGNOSIS — I10 Essential (primary) hypertension: Secondary | ICD-10-CM | POA: Diagnosis not present

## 2023-02-18 DIAGNOSIS — E119 Type 2 diabetes mellitus without complications: Secondary | ICD-10-CM | POA: Diagnosis not present

## 2023-02-18 DIAGNOSIS — D649 Anemia, unspecified: Secondary | ICD-10-CM | POA: Diagnosis not present

## 2023-02-19 DIAGNOSIS — G9341 Metabolic encephalopathy: Secondary | ICD-10-CM | POA: Diagnosis not present

## 2023-02-19 DIAGNOSIS — S72141D Displaced intertrochanteric fracture of right femur, subsequent encounter for closed fracture with routine healing: Secondary | ICD-10-CM | POA: Diagnosis not present

## 2023-02-19 DIAGNOSIS — Z4789 Encounter for other orthopedic aftercare: Secondary | ICD-10-CM | POA: Diagnosis not present

## 2023-02-19 DIAGNOSIS — Z96649 Presence of unspecified artificial hip joint: Secondary | ICD-10-CM | POA: Diagnosis not present

## 2023-02-19 DIAGNOSIS — D649 Anemia, unspecified: Secondary | ICD-10-CM | POA: Diagnosis not present

## 2023-02-19 DIAGNOSIS — I10 Essential (primary) hypertension: Secondary | ICD-10-CM | POA: Diagnosis not present

## 2023-02-19 DIAGNOSIS — K7581 Nonalcoholic steatohepatitis (NASH): Secondary | ICD-10-CM | POA: Diagnosis not present

## 2023-02-19 DIAGNOSIS — K7469 Other cirrhosis of liver: Secondary | ICD-10-CM | POA: Diagnosis not present

## 2023-02-19 DIAGNOSIS — M25561 Pain in right knee: Secondary | ICD-10-CM | POA: Diagnosis not present

## 2023-02-19 DIAGNOSIS — E119 Type 2 diabetes mellitus without complications: Secondary | ICD-10-CM | POA: Diagnosis not present

## 2023-02-19 DIAGNOSIS — D6959 Other secondary thrombocytopenia: Secondary | ICD-10-CM | POA: Diagnosis not present

## 2023-02-23 DIAGNOSIS — D649 Anemia, unspecified: Secondary | ICD-10-CM | POA: Diagnosis not present

## 2023-02-23 DIAGNOSIS — D6959 Other secondary thrombocytopenia: Secondary | ICD-10-CM | POA: Diagnosis not present

## 2023-02-23 DIAGNOSIS — E119 Type 2 diabetes mellitus without complications: Secondary | ICD-10-CM | POA: Diagnosis not present

## 2023-02-23 DIAGNOSIS — Z96649 Presence of unspecified artificial hip joint: Secondary | ICD-10-CM | POA: Diagnosis not present

## 2023-02-23 DIAGNOSIS — G9341 Metabolic encephalopathy: Secondary | ICD-10-CM | POA: Diagnosis not present

## 2023-02-23 DIAGNOSIS — S72141D Displaced intertrochanteric fracture of right femur, subsequent encounter for closed fracture with routine healing: Secondary | ICD-10-CM | POA: Diagnosis not present

## 2023-02-23 DIAGNOSIS — I10 Essential (primary) hypertension: Secondary | ICD-10-CM | POA: Diagnosis not present

## 2023-02-23 DIAGNOSIS — K7581 Nonalcoholic steatohepatitis (NASH): Secondary | ICD-10-CM | POA: Diagnosis not present

## 2023-02-23 DIAGNOSIS — K7469 Other cirrhosis of liver: Secondary | ICD-10-CM | POA: Diagnosis not present

## 2023-02-25 DIAGNOSIS — K766 Portal hypertension: Secondary | ICD-10-CM | POA: Diagnosis not present

## 2023-02-25 DIAGNOSIS — K7581 Nonalcoholic steatohepatitis (NASH): Secondary | ICD-10-CM | POA: Diagnosis not present

## 2023-02-25 DIAGNOSIS — K3189 Other diseases of stomach and duodenum: Secondary | ICD-10-CM | POA: Diagnosis not present

## 2023-02-25 DIAGNOSIS — K7460 Unspecified cirrhosis of liver: Secondary | ICD-10-CM | POA: Diagnosis not present

## 2023-02-26 DIAGNOSIS — G9341 Metabolic encephalopathy: Secondary | ICD-10-CM | POA: Diagnosis not present

## 2023-02-26 DIAGNOSIS — I10 Essential (primary) hypertension: Secondary | ICD-10-CM | POA: Diagnosis not present

## 2023-02-26 DIAGNOSIS — S72141D Displaced intertrochanteric fracture of right femur, subsequent encounter for closed fracture with routine healing: Secondary | ICD-10-CM | POA: Diagnosis not present

## 2023-02-26 DIAGNOSIS — E119 Type 2 diabetes mellitus without complications: Secondary | ICD-10-CM | POA: Diagnosis not present

## 2023-02-26 DIAGNOSIS — K7581 Nonalcoholic steatohepatitis (NASH): Secondary | ICD-10-CM | POA: Diagnosis not present

## 2023-02-26 DIAGNOSIS — K7469 Other cirrhosis of liver: Secondary | ICD-10-CM | POA: Diagnosis not present

## 2023-02-26 DIAGNOSIS — Z96649 Presence of unspecified artificial hip joint: Secondary | ICD-10-CM | POA: Diagnosis not present

## 2023-02-26 DIAGNOSIS — D6959 Other secondary thrombocytopenia: Secondary | ICD-10-CM | POA: Diagnosis not present

## 2023-02-26 DIAGNOSIS — D649 Anemia, unspecified: Secondary | ICD-10-CM | POA: Diagnosis not present

## 2023-03-03 DIAGNOSIS — K7581 Nonalcoholic steatohepatitis (NASH): Secondary | ICD-10-CM | POA: Diagnosis not present

## 2023-03-03 DIAGNOSIS — K7469 Other cirrhosis of liver: Secondary | ICD-10-CM | POA: Diagnosis not present

## 2023-03-03 DIAGNOSIS — Z96649 Presence of unspecified artificial hip joint: Secondary | ICD-10-CM | POA: Diagnosis not present

## 2023-03-03 DIAGNOSIS — G9341 Metabolic encephalopathy: Secondary | ICD-10-CM | POA: Diagnosis not present

## 2023-03-03 DIAGNOSIS — D649 Anemia, unspecified: Secondary | ICD-10-CM | POA: Diagnosis not present

## 2023-03-03 DIAGNOSIS — E119 Type 2 diabetes mellitus without complications: Secondary | ICD-10-CM | POA: Diagnosis not present

## 2023-03-03 DIAGNOSIS — S72141D Displaced intertrochanteric fracture of right femur, subsequent encounter for closed fracture with routine healing: Secondary | ICD-10-CM | POA: Diagnosis not present

## 2023-03-03 DIAGNOSIS — D6959 Other secondary thrombocytopenia: Secondary | ICD-10-CM | POA: Diagnosis not present

## 2023-03-03 DIAGNOSIS — I10 Essential (primary) hypertension: Secondary | ICD-10-CM | POA: Diagnosis not present

## 2023-03-06 DIAGNOSIS — G9341 Metabolic encephalopathy: Secondary | ICD-10-CM | POA: Diagnosis not present

## 2023-03-06 DIAGNOSIS — E119 Type 2 diabetes mellitus without complications: Secondary | ICD-10-CM | POA: Diagnosis not present

## 2023-03-06 DIAGNOSIS — I10 Essential (primary) hypertension: Secondary | ICD-10-CM | POA: Diagnosis not present

## 2023-03-06 DIAGNOSIS — S72141D Displaced intertrochanteric fracture of right femur, subsequent encounter for closed fracture with routine healing: Secondary | ICD-10-CM | POA: Diagnosis not present

## 2023-03-06 DIAGNOSIS — Z96649 Presence of unspecified artificial hip joint: Secondary | ICD-10-CM | POA: Diagnosis not present

## 2023-03-06 DIAGNOSIS — K7581 Nonalcoholic steatohepatitis (NASH): Secondary | ICD-10-CM | POA: Diagnosis not present

## 2023-03-06 DIAGNOSIS — D649 Anemia, unspecified: Secondary | ICD-10-CM | POA: Diagnosis not present

## 2023-03-06 DIAGNOSIS — K7469 Other cirrhosis of liver: Secondary | ICD-10-CM | POA: Diagnosis not present

## 2023-03-06 DIAGNOSIS — D6959 Other secondary thrombocytopenia: Secondary | ICD-10-CM | POA: Diagnosis not present

## 2023-03-10 DIAGNOSIS — K7469 Other cirrhosis of liver: Secondary | ICD-10-CM | POA: Diagnosis not present

## 2023-03-10 DIAGNOSIS — D649 Anemia, unspecified: Secondary | ICD-10-CM | POA: Diagnosis not present

## 2023-03-10 DIAGNOSIS — Z96649 Presence of unspecified artificial hip joint: Secondary | ICD-10-CM | POA: Diagnosis not present

## 2023-03-10 DIAGNOSIS — G9341 Metabolic encephalopathy: Secondary | ICD-10-CM | POA: Diagnosis not present

## 2023-03-10 DIAGNOSIS — S72141D Displaced intertrochanteric fracture of right femur, subsequent encounter for closed fracture with routine healing: Secondary | ICD-10-CM | POA: Diagnosis not present

## 2023-03-10 DIAGNOSIS — D6959 Other secondary thrombocytopenia: Secondary | ICD-10-CM | POA: Diagnosis not present

## 2023-03-10 DIAGNOSIS — E119 Type 2 diabetes mellitus without complications: Secondary | ICD-10-CM | POA: Diagnosis not present

## 2023-03-10 DIAGNOSIS — K7581 Nonalcoholic steatohepatitis (NASH): Secondary | ICD-10-CM | POA: Diagnosis not present

## 2023-03-10 DIAGNOSIS — I10 Essential (primary) hypertension: Secondary | ICD-10-CM | POA: Diagnosis not present

## 2023-03-11 DIAGNOSIS — I10 Essential (primary) hypertension: Secondary | ICD-10-CM | POA: Diagnosis not present

## 2023-03-11 DIAGNOSIS — D649 Anemia, unspecified: Secondary | ICD-10-CM | POA: Diagnosis not present

## 2023-03-11 DIAGNOSIS — K7581 Nonalcoholic steatohepatitis (NASH): Secondary | ICD-10-CM | POA: Diagnosis not present

## 2023-03-11 DIAGNOSIS — S72141D Displaced intertrochanteric fracture of right femur, subsequent encounter for closed fracture with routine healing: Secondary | ICD-10-CM | POA: Diagnosis not present

## 2023-03-11 DIAGNOSIS — G9341 Metabolic encephalopathy: Secondary | ICD-10-CM | POA: Diagnosis not present

## 2023-03-11 DIAGNOSIS — E119 Type 2 diabetes mellitus without complications: Secondary | ICD-10-CM | POA: Diagnosis not present

## 2023-03-11 DIAGNOSIS — Z96649 Presence of unspecified artificial hip joint: Secondary | ICD-10-CM | POA: Diagnosis not present

## 2023-03-11 DIAGNOSIS — K7469 Other cirrhosis of liver: Secondary | ICD-10-CM | POA: Diagnosis not present

## 2023-03-11 DIAGNOSIS — D6959 Other secondary thrombocytopenia: Secondary | ICD-10-CM | POA: Diagnosis not present

## 2023-03-13 DIAGNOSIS — N181 Chronic kidney disease, stage 1: Secondary | ICD-10-CM | POA: Diagnosis not present

## 2023-03-13 DIAGNOSIS — Z794 Long term (current) use of insulin: Secondary | ICD-10-CM | POA: Diagnosis not present

## 2023-03-13 DIAGNOSIS — K7290 Hepatic failure, unspecified without coma: Secondary | ICD-10-CM | POA: Diagnosis not present

## 2023-03-13 DIAGNOSIS — I129 Hypertensive chronic kidney disease with stage 1 through stage 4 chronic kidney disease, or unspecified chronic kidney disease: Secondary | ICD-10-CM | POA: Diagnosis not present

## 2023-03-13 DIAGNOSIS — E1122 Type 2 diabetes mellitus with diabetic chronic kidney disease: Secondary | ICD-10-CM | POA: Diagnosis not present

## 2023-03-13 DIAGNOSIS — K744 Secondary biliary cirrhosis: Secondary | ICD-10-CM | POA: Diagnosis not present

## 2023-03-13 DIAGNOSIS — R188 Other ascites: Secondary | ICD-10-CM | POA: Diagnosis not present

## 2023-03-13 DIAGNOSIS — F321 Major depressive disorder, single episode, moderate: Secondary | ICD-10-CM | POA: Diagnosis not present

## 2023-03-13 DIAGNOSIS — M810 Age-related osteoporosis without current pathological fracture: Secondary | ICD-10-CM | POA: Diagnosis not present

## 2023-03-16 DIAGNOSIS — K7581 Nonalcoholic steatohepatitis (NASH): Secondary | ICD-10-CM | POA: Diagnosis not present

## 2023-03-16 DIAGNOSIS — K7469 Other cirrhosis of liver: Secondary | ICD-10-CM | POA: Diagnosis not present

## 2023-03-16 DIAGNOSIS — S72141D Displaced intertrochanteric fracture of right femur, subsequent encounter for closed fracture with routine healing: Secondary | ICD-10-CM | POA: Diagnosis not present

## 2023-03-16 DIAGNOSIS — Z96649 Presence of unspecified artificial hip joint: Secondary | ICD-10-CM | POA: Diagnosis not present

## 2023-03-16 DIAGNOSIS — E119 Type 2 diabetes mellitus without complications: Secondary | ICD-10-CM | POA: Diagnosis not present

## 2023-03-16 DIAGNOSIS — I10 Essential (primary) hypertension: Secondary | ICD-10-CM | POA: Diagnosis not present

## 2023-03-16 DIAGNOSIS — D6959 Other secondary thrombocytopenia: Secondary | ICD-10-CM | POA: Diagnosis not present

## 2023-03-16 DIAGNOSIS — D649 Anemia, unspecified: Secondary | ICD-10-CM | POA: Diagnosis not present

## 2023-03-16 DIAGNOSIS — G9341 Metabolic encephalopathy: Secondary | ICD-10-CM | POA: Diagnosis not present

## 2023-03-24 DIAGNOSIS — I10 Essential (primary) hypertension: Secondary | ICD-10-CM | POA: Diagnosis not present

## 2023-03-24 DIAGNOSIS — E119 Type 2 diabetes mellitus without complications: Secondary | ICD-10-CM | POA: Diagnosis not present

## 2023-03-24 DIAGNOSIS — S72141D Displaced intertrochanteric fracture of right femur, subsequent encounter for closed fracture with routine healing: Secondary | ICD-10-CM | POA: Diagnosis not present

## 2023-03-24 DIAGNOSIS — D649 Anemia, unspecified: Secondary | ICD-10-CM | POA: Diagnosis not present

## 2023-03-24 DIAGNOSIS — K7581 Nonalcoholic steatohepatitis (NASH): Secondary | ICD-10-CM | POA: Diagnosis not present

## 2023-03-24 DIAGNOSIS — Z96649 Presence of unspecified artificial hip joint: Secondary | ICD-10-CM | POA: Diagnosis not present

## 2023-03-24 DIAGNOSIS — G9341 Metabolic encephalopathy: Secondary | ICD-10-CM | POA: Diagnosis not present

## 2023-03-24 DIAGNOSIS — D6959 Other secondary thrombocytopenia: Secondary | ICD-10-CM | POA: Diagnosis not present

## 2023-03-24 DIAGNOSIS — K7469 Other cirrhosis of liver: Secondary | ICD-10-CM | POA: Diagnosis not present

## 2023-03-26 DIAGNOSIS — S72141D Displaced intertrochanteric fracture of right femur, subsequent encounter for closed fracture with routine healing: Secondary | ICD-10-CM | POA: Diagnosis not present

## 2023-03-26 DIAGNOSIS — E119 Type 2 diabetes mellitus without complications: Secondary | ICD-10-CM | POA: Diagnosis not present

## 2023-03-26 DIAGNOSIS — K7469 Other cirrhosis of liver: Secondary | ICD-10-CM | POA: Diagnosis not present

## 2023-03-26 DIAGNOSIS — K7581 Nonalcoholic steatohepatitis (NASH): Secondary | ICD-10-CM | POA: Diagnosis not present

## 2023-03-26 DIAGNOSIS — G9341 Metabolic encephalopathy: Secondary | ICD-10-CM | POA: Diagnosis not present

## 2023-03-26 DIAGNOSIS — I10 Essential (primary) hypertension: Secondary | ICD-10-CM | POA: Diagnosis not present

## 2023-03-26 DIAGNOSIS — Z96649 Presence of unspecified artificial hip joint: Secondary | ICD-10-CM | POA: Diagnosis not present

## 2023-03-26 DIAGNOSIS — D649 Anemia, unspecified: Secondary | ICD-10-CM | POA: Diagnosis not present

## 2023-03-26 DIAGNOSIS — D6959 Other secondary thrombocytopenia: Secondary | ICD-10-CM | POA: Diagnosis not present

## 2023-03-28 DIAGNOSIS — S72141D Displaced intertrochanteric fracture of right femur, subsequent encounter for closed fracture with routine healing: Secondary | ICD-10-CM | POA: Diagnosis not present

## 2023-03-28 DIAGNOSIS — K7581 Nonalcoholic steatohepatitis (NASH): Secondary | ICD-10-CM | POA: Diagnosis not present

## 2023-03-28 DIAGNOSIS — H9193 Unspecified hearing loss, bilateral: Secondary | ICD-10-CM | POA: Diagnosis not present

## 2023-03-28 DIAGNOSIS — D649 Anemia, unspecified: Secondary | ICD-10-CM | POA: Diagnosis not present

## 2023-03-28 DIAGNOSIS — K7469 Other cirrhosis of liver: Secondary | ICD-10-CM | POA: Diagnosis not present

## 2023-03-28 DIAGNOSIS — I10 Essential (primary) hypertension: Secondary | ICD-10-CM | POA: Diagnosis not present

## 2023-03-28 DIAGNOSIS — D6959 Other secondary thrombocytopenia: Secondary | ICD-10-CM | POA: Diagnosis not present

## 2023-03-28 DIAGNOSIS — E119 Type 2 diabetes mellitus without complications: Secondary | ICD-10-CM | POA: Diagnosis not present

## 2023-03-28 DIAGNOSIS — G9341 Metabolic encephalopathy: Secondary | ICD-10-CM | POA: Diagnosis not present

## 2023-03-31 DIAGNOSIS — Z4789 Encounter for other orthopedic aftercare: Secondary | ICD-10-CM | POA: Diagnosis not present

## 2023-04-21 DIAGNOSIS — R928 Other abnormal and inconclusive findings on diagnostic imaging of breast: Secondary | ICD-10-CM | POA: Diagnosis not present

## 2023-05-04 IMAGING — US US ABDOMEN LIMITED
1 series · 14 of 25 positions shown · non-contrast
Comparison: March 05, 2021

CLINICAL DATA: Liver cirrhosis secondary to NASH.

EXAM:
ULTRASOUND ABDOMEN LIMITED RIGHT UPPER QUADRANT

[Series 1: us abdomen limited · 0.20mm/px · 14 of 46 slices shown]
[im 1/46]
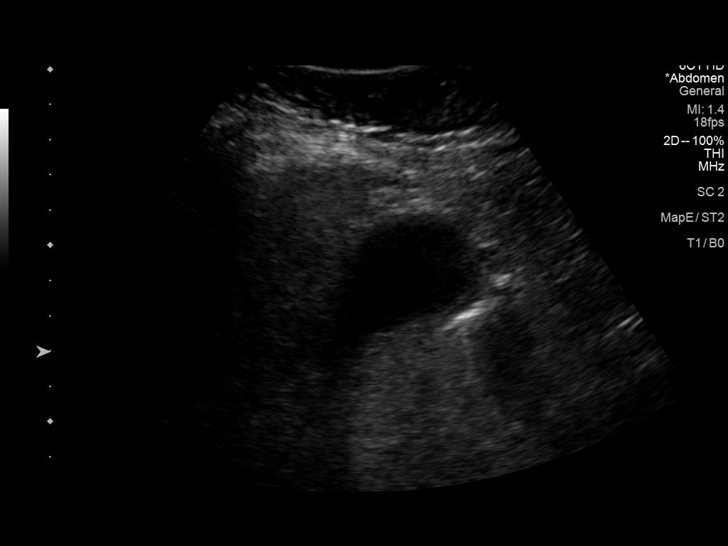
[im 4/46]
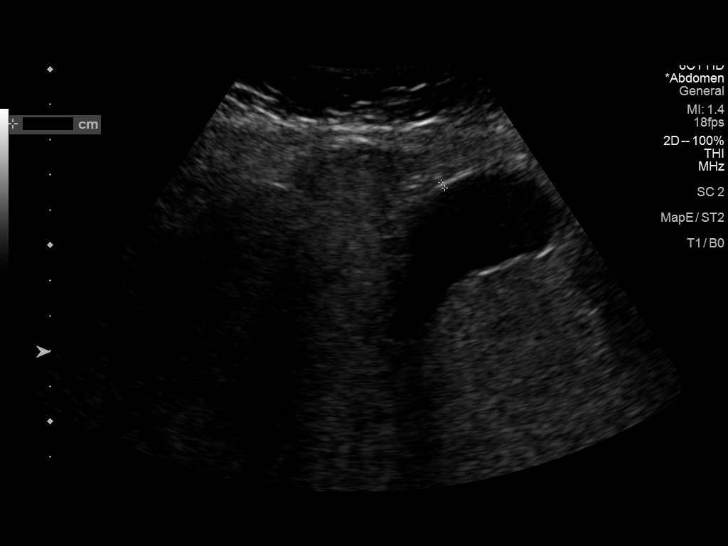
[im 8/46]
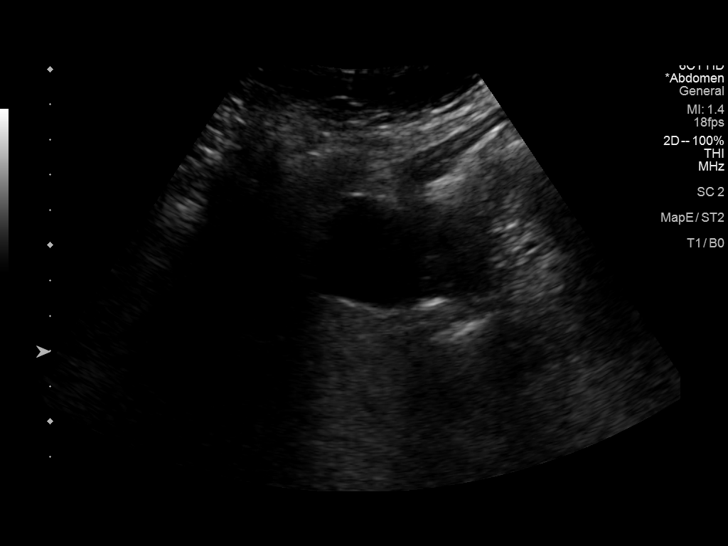
[im 12/46]
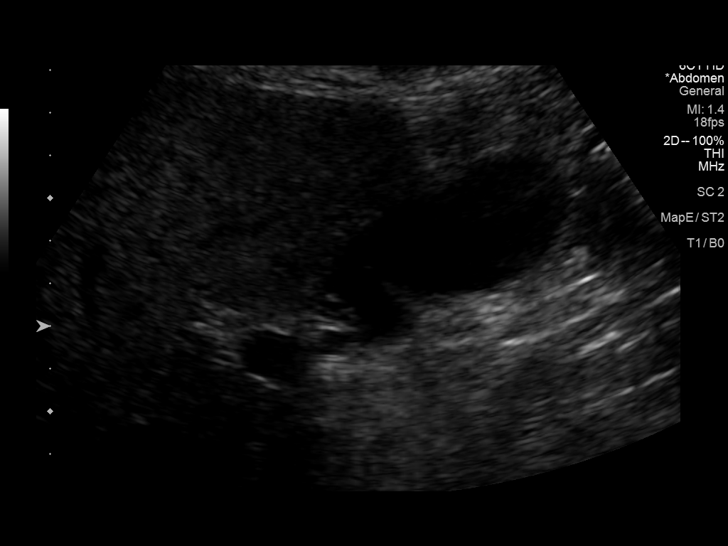
[im 16/46]
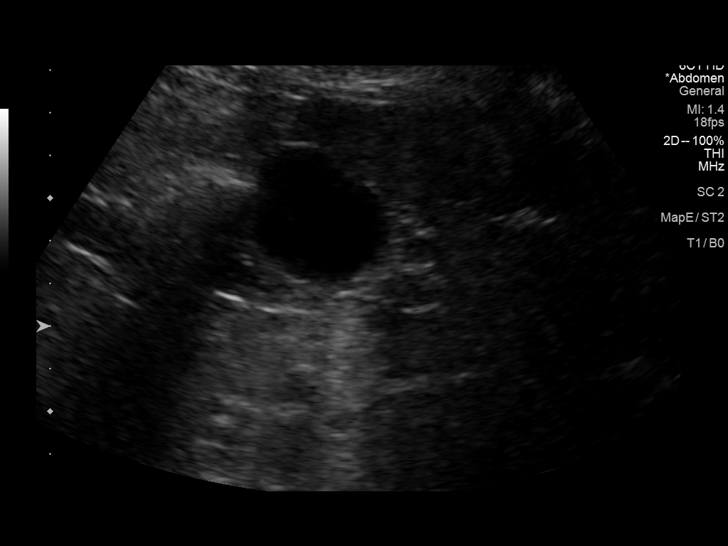
[im 17/46]
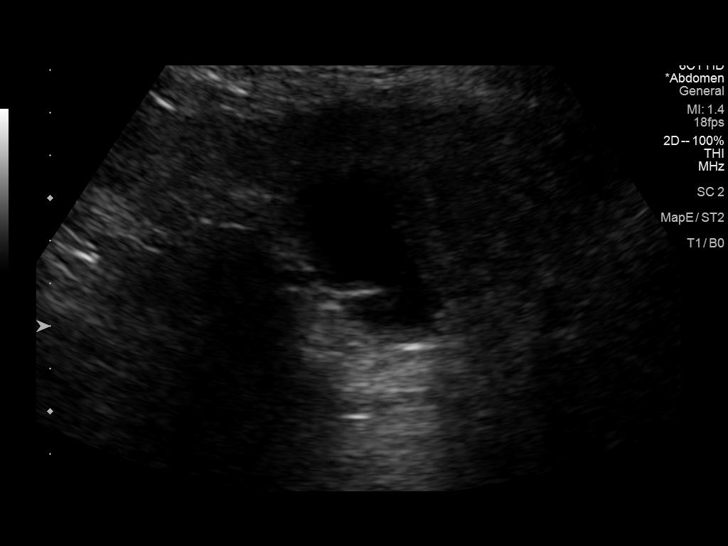
[im 21/46]
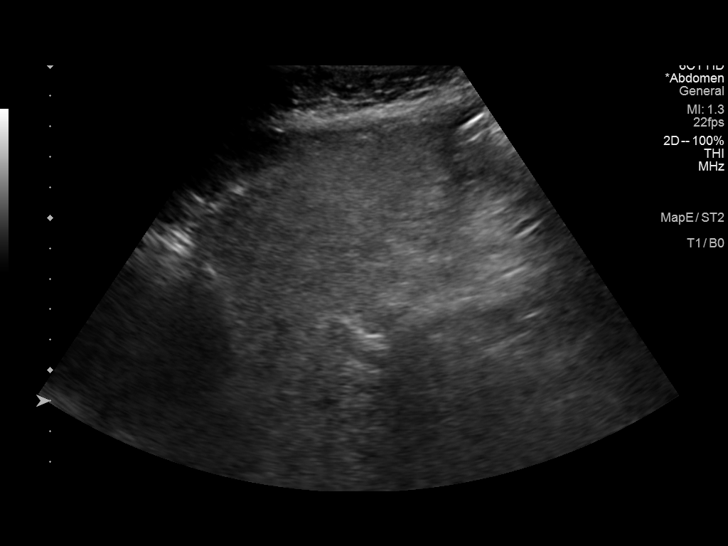
[im 25/46]
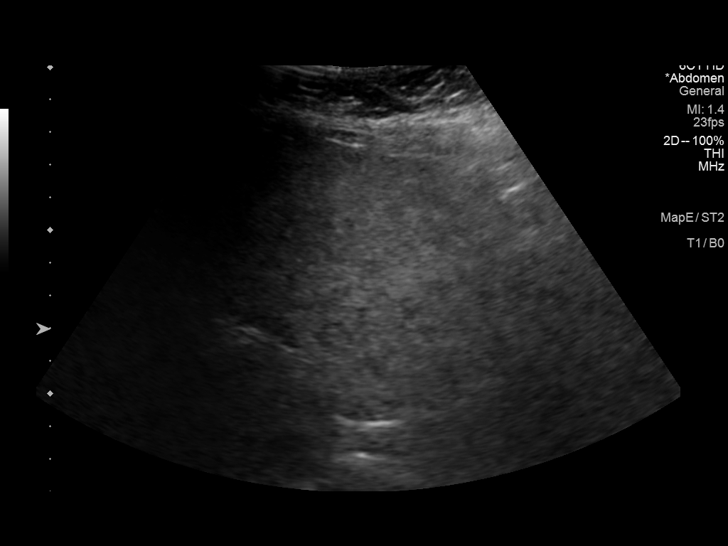
[im 29/46]
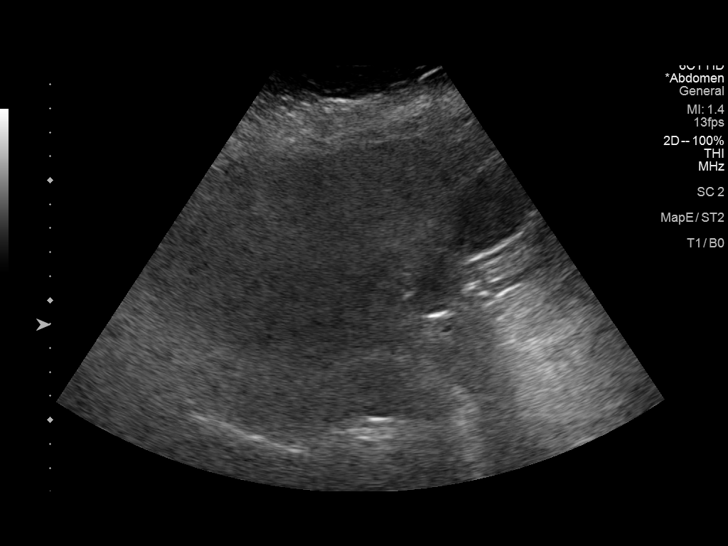
[im 31/46]
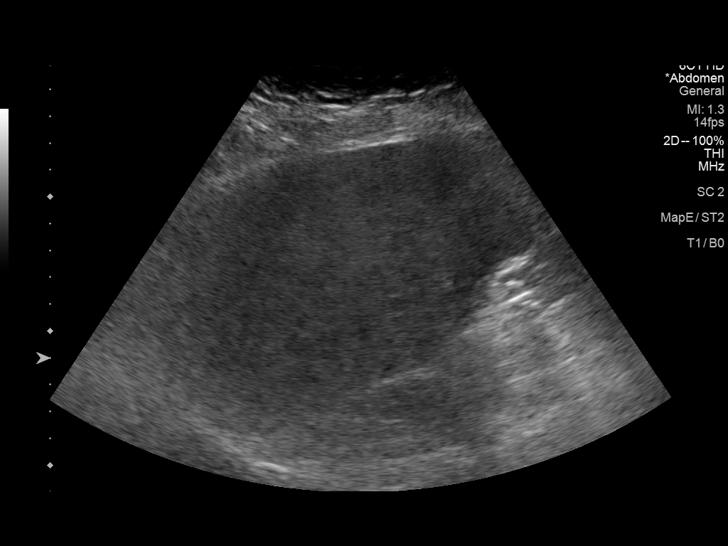
[im 34/46]
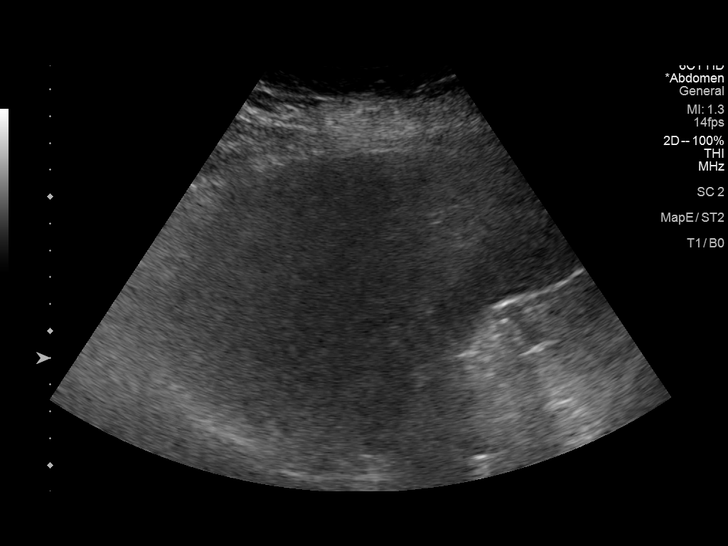
[im 38/46]
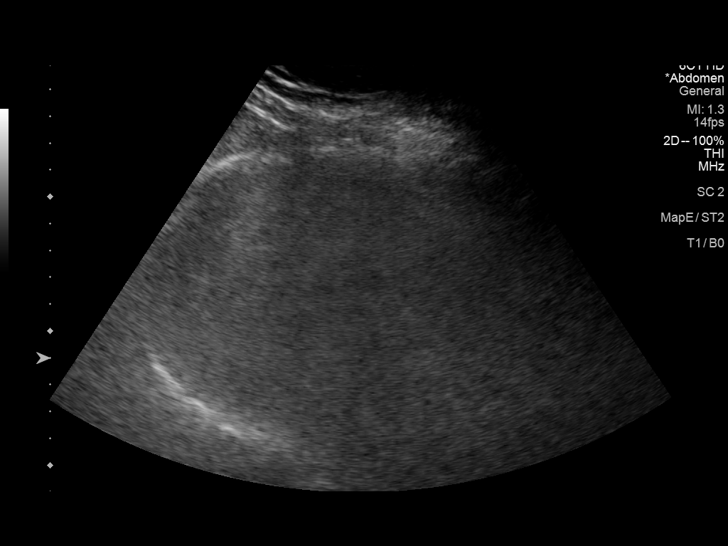
[im 42/46]
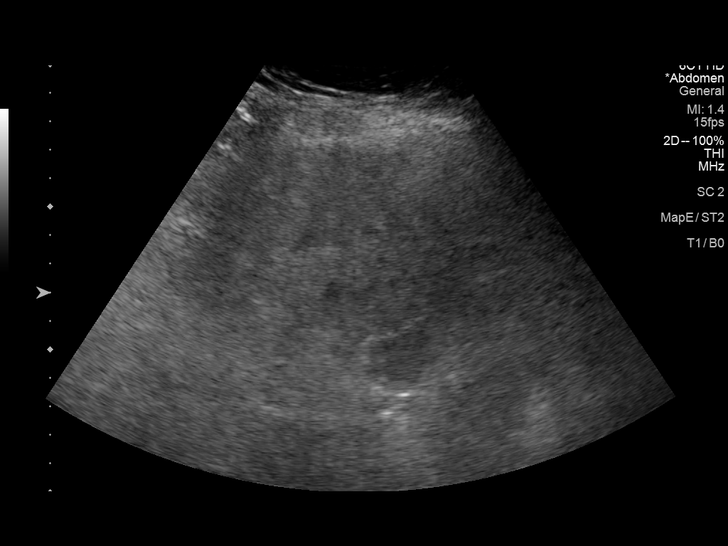
[im 46/46]
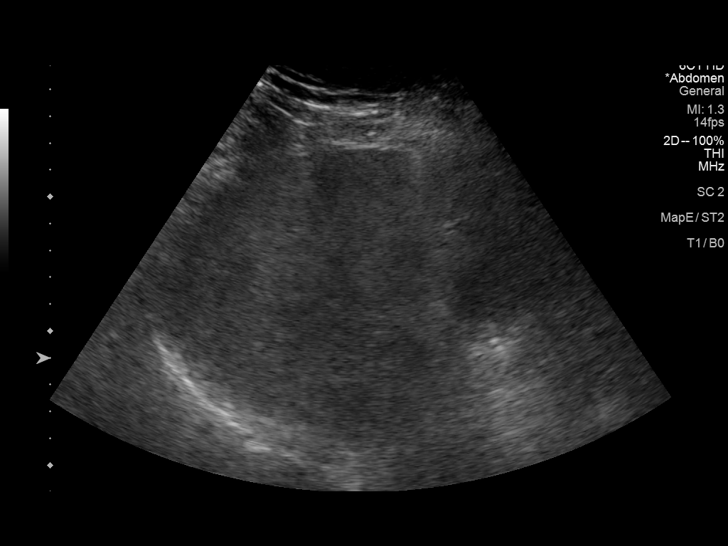

[14 of 25 positions shown; findings below may reference images not displayed]

FINDINGS: Gallbladder:

No gallstones or wall thickening visualized (1.9 mm). No sonographic
Murphy sign noted by sonographer.

Common bile duct:

Diameter: 4.5 mm

Liver:

No focal lesion identified. The liver parenchyma is coarse in
echotexture and diffusely increased in echogenicity. Portal vein is
patent on color Doppler imaging with normal direction of blood flow
towards the liver.

Other: None.
IMPRESSION: Findings consistent with hepatic cirrhosis without focal liver
lesions.

## 2023-05-07 ENCOUNTER — Other Ambulatory Visit (HOSPITAL_COMMUNITY): Payer: Self-pay

## 2023-05-07 ENCOUNTER — Other Ambulatory Visit: Payer: Self-pay

## 2023-05-07 DIAGNOSIS — E1122 Type 2 diabetes mellitus with diabetic chronic kidney disease: Secondary | ICD-10-CM | POA: Diagnosis not present

## 2023-05-07 DIAGNOSIS — D693 Immune thrombocytopenic purpura: Secondary | ICD-10-CM | POA: Diagnosis not present

## 2023-05-07 DIAGNOSIS — I350 Nonrheumatic aortic (valve) stenosis: Secondary | ICD-10-CM | POA: Diagnosis not present

## 2023-05-07 DIAGNOSIS — E782 Mixed hyperlipidemia: Secondary | ICD-10-CM | POA: Diagnosis not present

## 2023-05-07 DIAGNOSIS — I129 Hypertensive chronic kidney disease with stage 1 through stage 4 chronic kidney disease, or unspecified chronic kidney disease: Secondary | ICD-10-CM | POA: Diagnosis not present

## 2023-05-07 DIAGNOSIS — N181 Chronic kidney disease, stage 1: Secondary | ICD-10-CM | POA: Diagnosis not present

## 2023-05-07 DIAGNOSIS — K7290 Hepatic failure, unspecified without coma: Secondary | ICD-10-CM | POA: Diagnosis not present

## 2023-05-07 DIAGNOSIS — K7460 Unspecified cirrhosis of liver: Secondary | ICD-10-CM | POA: Diagnosis not present

## 2023-05-07 DIAGNOSIS — Z0000 Encounter for general adult medical examination without abnormal findings: Secondary | ICD-10-CM | POA: Diagnosis not present

## 2023-05-07 DIAGNOSIS — F321 Major depressive disorder, single episode, moderate: Secondary | ICD-10-CM | POA: Diagnosis not present

## 2023-05-07 MED ORDER — FREESTYLE LIBRE 3 SENSOR MISC
1.0000 | Freq: Every day | 11 refills | Status: AC
  Filled 2023-05-07: qty 2, 28d supply, fill #0
  Filled 2023-06-10: qty 2, 28d supply, fill #1

## 2023-06-02 DIAGNOSIS — M25551 Pain in right hip: Secondary | ICD-10-CM | POA: Diagnosis not present

## 2023-06-10 ENCOUNTER — Other Ambulatory Visit (HOSPITAL_COMMUNITY): Payer: Self-pay

## 2023-06-10 DIAGNOSIS — R600 Localized edema: Secondary | ICD-10-CM | POA: Diagnosis not present

## 2023-06-10 DIAGNOSIS — K3189 Other diseases of stomach and duodenum: Secondary | ICD-10-CM | POA: Diagnosis not present

## 2023-06-10 DIAGNOSIS — K766 Portal hypertension: Secondary | ICD-10-CM | POA: Diagnosis not present

## 2023-06-10 DIAGNOSIS — K7581 Nonalcoholic steatohepatitis (NASH): Secondary | ICD-10-CM | POA: Diagnosis not present

## 2023-06-10 DIAGNOSIS — M810 Age-related osteoporosis without current pathological fracture: Secondary | ICD-10-CM | POA: Diagnosis not present

## 2023-06-10 DIAGNOSIS — E440 Moderate protein-calorie malnutrition: Secondary | ICD-10-CM | POA: Diagnosis not present

## 2023-06-10 DIAGNOSIS — K7682 Hepatic encephalopathy: Secondary | ICD-10-CM | POA: Diagnosis not present

## 2023-06-10 DIAGNOSIS — K7460 Unspecified cirrhosis of liver: Secondary | ICD-10-CM | POA: Diagnosis not present

## 2023-06-17 ENCOUNTER — Other Ambulatory Visit: Payer: Self-pay | Admitting: Nurse Practitioner

## 2023-06-17 DIAGNOSIS — K7581 Nonalcoholic steatohepatitis (NASH): Secondary | ICD-10-CM

## 2023-06-17 DIAGNOSIS — K7682 Hepatic encephalopathy: Secondary | ICD-10-CM

## 2023-06-17 DIAGNOSIS — K3189 Other diseases of stomach and duodenum: Secondary | ICD-10-CM

## 2023-07-03 ENCOUNTER — Ambulatory Visit
Admission: RE | Admit: 2023-07-03 | Discharge: 2023-07-03 | Disposition: A | Discharge status: Home / Self Care | Payer: Medicare PPO | Source: Ambulatory Visit | Attending: Nurse Practitioner | Admitting: Nurse Practitioner

## 2023-07-03 DIAGNOSIS — K3189 Other diseases of stomach and duodenum: Secondary | ICD-10-CM

## 2023-07-03 DIAGNOSIS — K8020 Calculus of gallbladder without cholecystitis without obstruction: Secondary | ICD-10-CM | POA: Diagnosis not present

## 2023-07-03 DIAGNOSIS — K7682 Hepatic encephalopathy: Secondary | ICD-10-CM

## 2023-07-03 DIAGNOSIS — K7581 Nonalcoholic steatohepatitis (NASH): Secondary | ICD-10-CM

## 2023-07-03 DIAGNOSIS — K838 Other specified diseases of biliary tract: Secondary | ICD-10-CM | POA: Diagnosis not present

## 2023-07-06 ENCOUNTER — Ambulatory Visit: Payer: Medicare PPO | Admitting: Dietician

## 2023-07-21 DIAGNOSIS — Z23 Encounter for immunization: Secondary | ICD-10-CM | POA: Diagnosis not present

## 2023-08-17 DIAGNOSIS — D693 Immune thrombocytopenic purpura: Secondary | ICD-10-CM | POA: Diagnosis not present

## 2023-08-17 DIAGNOSIS — D638 Anemia in other chronic diseases classified elsewhere: Secondary | ICD-10-CM | POA: Diagnosis not present

## 2023-08-17 DIAGNOSIS — K7682 Hepatic encephalopathy: Secondary | ICD-10-CM | POA: Diagnosis not present

## 2023-08-17 DIAGNOSIS — N181 Chronic kidney disease, stage 1: Secondary | ICD-10-CM | POA: Diagnosis not present

## 2023-08-17 DIAGNOSIS — K766 Portal hypertension: Secondary | ICD-10-CM | POA: Diagnosis not present

## 2023-08-17 DIAGNOSIS — E1122 Type 2 diabetes mellitus with diabetic chronic kidney disease: Secondary | ICD-10-CM | POA: Diagnosis not present

## 2023-08-17 DIAGNOSIS — I129 Hypertensive chronic kidney disease with stage 1 through stage 4 chronic kidney disease, or unspecified chronic kidney disease: Secondary | ICD-10-CM | POA: Diagnosis not present

## 2023-08-17 DIAGNOSIS — K7460 Unspecified cirrhosis of liver: Secondary | ICD-10-CM | POA: Diagnosis not present

## 2023-08-17 DIAGNOSIS — F331 Major depressive disorder, recurrent, moderate: Secondary | ICD-10-CM | POA: Diagnosis not present

## 2023-08-19 DIAGNOSIS — Z1231 Encounter for screening mammogram for malignant neoplasm of breast: Secondary | ICD-10-CM | POA: Diagnosis not present

## 2023-10-07 DIAGNOSIS — E119 Type 2 diabetes mellitus without complications: Secondary | ICD-10-CM | POA: Diagnosis not present

## 2023-10-07 DIAGNOSIS — H43812 Vitreous degeneration, left eye: Secondary | ICD-10-CM | POA: Diagnosis not present

## 2023-10-07 DIAGNOSIS — Z961 Presence of intraocular lens: Secondary | ICD-10-CM | POA: Diagnosis not present

## 2023-10-22 DIAGNOSIS — L821 Other seborrheic keratosis: Secondary | ICD-10-CM | POA: Diagnosis not present

## 2023-10-22 DIAGNOSIS — L570 Actinic keratosis: Secondary | ICD-10-CM | POA: Diagnosis not present

## 2023-10-22 DIAGNOSIS — D692 Other nonthrombocytopenic purpura: Secondary | ICD-10-CM | POA: Diagnosis not present

## 2023-12-09 ENCOUNTER — Other Ambulatory Visit: Payer: Self-pay | Admitting: Nurse Practitioner

## 2023-12-09 DIAGNOSIS — K3189 Other diseases of stomach and duodenum: Secondary | ICD-10-CM | POA: Diagnosis not present

## 2023-12-09 DIAGNOSIS — R4189 Other symptoms and signs involving cognitive functions and awareness: Secondary | ICD-10-CM | POA: Diagnosis not present

## 2023-12-09 DIAGNOSIS — K766 Portal hypertension: Secondary | ICD-10-CM | POA: Diagnosis not present

## 2023-12-09 DIAGNOSIS — K7460 Unspecified cirrhosis of liver: Secondary | ICD-10-CM

## 2023-12-09 DIAGNOSIS — K7581 Nonalcoholic steatohepatitis (NASH): Secondary | ICD-10-CM | POA: Diagnosis not present

## 2023-12-09 DIAGNOSIS — K7682 Hepatic encephalopathy: Secondary | ICD-10-CM

## 2023-12-10 DIAGNOSIS — K7460 Unspecified cirrhosis of liver: Secondary | ICD-10-CM | POA: Diagnosis not present

## 2023-12-10 DIAGNOSIS — M810 Age-related osteoporosis without current pathological fracture: Secondary | ICD-10-CM | POA: Diagnosis not present

## 2023-12-10 DIAGNOSIS — R4189 Other symptoms and signs involving cognitive functions and awareness: Secondary | ICD-10-CM | POA: Diagnosis not present

## 2023-12-10 DIAGNOSIS — N181 Chronic kidney disease, stage 1: Secondary | ICD-10-CM | POA: Diagnosis not present

## 2023-12-10 DIAGNOSIS — K7682 Hepatic encephalopathy: Secondary | ICD-10-CM | POA: Diagnosis not present

## 2023-12-10 DIAGNOSIS — I129 Hypertensive chronic kidney disease with stage 1 through stage 4 chronic kidney disease, or unspecified chronic kidney disease: Secondary | ICD-10-CM | POA: Diagnosis not present

## 2023-12-10 DIAGNOSIS — E1122 Type 2 diabetes mellitus with diabetic chronic kidney disease: Secondary | ICD-10-CM | POA: Diagnosis not present

## 2023-12-10 DIAGNOSIS — E782 Mixed hyperlipidemia: Secondary | ICD-10-CM | POA: Diagnosis not present

## 2023-12-10 DIAGNOSIS — K766 Portal hypertension: Secondary | ICD-10-CM | POA: Diagnosis not present

## 2023-12-14 ENCOUNTER — Encounter: Payer: Self-pay | Admitting: Nurse Practitioner

## 2023-12-16 ENCOUNTER — Ambulatory Visit
Admission: RE | Admit: 2023-12-16 | Discharge: 2023-12-16 | Disposition: A | Discharge status: Home / Self Care | Source: Ambulatory Visit | Attending: Nurse Practitioner | Admitting: Nurse Practitioner

## 2023-12-16 ENCOUNTER — Ambulatory Visit: Admitting: Physician Assistant

## 2023-12-16 ENCOUNTER — Encounter: Payer: Self-pay | Admitting: Physician Assistant

## 2023-12-16 ENCOUNTER — Other Ambulatory Visit

## 2023-12-16 ENCOUNTER — Ambulatory Visit

## 2023-12-16 VITALS — BP 125/76 | HR 76 | Resp 20 | Wt 147.0 lb

## 2023-12-16 DIAGNOSIS — R251 Tremor, unspecified: Secondary | ICD-10-CM

## 2023-12-16 DIAGNOSIS — K769 Liver disease, unspecified: Secondary | ICD-10-CM | POA: Diagnosis not present

## 2023-12-16 DIAGNOSIS — K7682 Hepatic encephalopathy: Secondary | ICD-10-CM

## 2023-12-16 DIAGNOSIS — R413 Other amnesia: Secondary | ICD-10-CM

## 2023-12-16 DIAGNOSIS — K7460 Unspecified cirrhosis of liver: Secondary | ICD-10-CM

## 2023-12-16 NOTE — Progress Notes (Addendum)
 Assessment/Plan:     Denise Klein is a very pleasant 73 y.o. year old RH female with a history of hypertension, hyperlipidemia, liver cirrhosis secondary to NASH with hospitalization for acute metabolic encephalopathy on 01/14/2023, DM2, anemia of chronic disease, seen today for evaluation of memory loss. MoCA was unable to be performed,she was not seem to comprehend the instructions in addition to severe HOH. However, she was able to perform well on her MMSE 27/30. Workup is in progress, but etiology of memory concerns is unclear.  In addition, she demonstrated parkinsonian features, with flat affect, very slow speech with no hypophonia (although she reports that she always spoke slowly because she is a "Southern lady", resting and intention tremors, short stride, wider gait, and on exam increased tone and cogwheeling on the left arm, micrographia.   Memory Impairment of unclear etiology   MRI brain without contrast to assess for underlying structural abnormality and assess vascular load Will consider DaTscan in the near future to evaluate for Parkinson's disease Check B12, TSH Recommend good control of cardiovascular risk factors.   Continue to control mood as per PCP Continue PT for strength and balance Recommend having her hearing checked to improve comprehension Folllow up in 3 months   Subjective:    The patient is accompanied by her husband who supplement  the history.    How long did patient have memory difficulties?  She denies any memory issues.  She denies any difficulty remembering new information or recent conversations.  Her daughter who is not here with her today, was concerned about her memory.  Her husband states that "if she has any memory issues again from the liver ".   repeats oneself?  Denies Disoriented when walking into a room?  Patient denies    Leaving objects in unusual places?  Denies.   Wandering behavior? Denies.   Any personality changes, or  depression, anxiety? Denies  Hallucinations or paranoia? Denies.   Seizures? Denies.    Any sleep changes?  Sleeps well.  Denies frequent nightmares or dream reenactment or sleepwalking   Sleep apnea? Denies.   Any hygiene concerns?  Denies.   Independent of bathing and dressing? Endorsed  Does the patient need help with medications?  Patient is in charge   Who is in charge of the finances?   husband is in charge  Any changes in appetite?   Denies.     Patient have trouble swallowing?  Denies.   Does the patient cook? No  yes, denies forgetting common recipes or kitchen accidents   Any headaches?  Denies.   Chronic pain? Denies.   Ambulates with difficulty? Denies.  Recent falls or head injuries? Denies.     Vision changes?  Denies any new issues.   Any strokelike symptoms? Denies.   Any tremors?  Endorsed, addressed and on intention. Her husband reports that these are worse "when nervous".  Drop objects? NO Diplopia  NO  encephalitis or meningitis NO  Muscle cramps NO  Any significant drooling NO  buttoning Denies any issues  Skin checkup 4/30 no malignant findings.  Any anosmia? Denies.   Any incontinence of urine? Denies.   Any bowel dysfunction? Chronic diarrhea due to lactulose   Patient lives with her husband History of heavy alcohol intake? Denies.   History of heavy tobacco use? Denies.   Family history of dementia? Denies Does patient drive?  No, she chose not to renew it        Allergies  Allergen Reactions   Sulfamethoxazole-Trimethoprim Other (See Comments) and Rash   Acetaminophen  Other (See Comments)    Liver affected   Ibuprofen Other (See Comments)    Liver affected   Meloxicam Other (See Comments)   Molds & Smuts    Naproxen Sodium Other (See Comments)    Liver affected   Other Other (See Comments)   Pentazocine Other (See Comments)    hallucinations   Propoxyphene Swelling   Statins Other (See Comments)   Lisinopril Other (See Comments) and  Nausea And Vomiting   Penicillin G Rash   Sulfamethoxazole Rash    Current Outpatient Medications  Medication Instructions   ACCU-CHEK AVIVA PLUS test strip No dose, route, or frequency recorded.   acetaminophen  (TYLENOL ) 650 mg, Oral, Every 6 hours PRN   amLODipine  (NORVASC ) 5 mg, Oral, Daily   cetirizine (ZYRTEC) 10 mg, Daily   colesevelam  (WELCHOL ) 625 MG tablet 3 tablets, 2 times daily   Continuous Glucose Sensor (FREESTYLE LIBRE 3 SENSOR) MISC use daily for blood sugar check   feeding supplement (ENSURE ENLIVE / ENSURE PLUS) LIQD 237 mLs, Oral, 2 times daily between meals   hydrocortisone  (ANUSOL -HC) 25 mg, 2 times daily   insulin  aspart (NOVOLOG ) 5 Units, Subcutaneous, 3 times daily with meals   lactulose  (CHRONULAC ) 20 g, Oral, 3 times daily   metFORMIN (GLUCOPHAGE-XR) 500 mg, Daily with breakfast   metoprolol  succinate (TOPROL -XL) 100 mg, Oral, Daily   Nystatin (GERHARDT'S BUTT CREAM) CREA 1 Application, Topical, 2 times daily   oxyCODONE  (ROXICODONE ) 5 mg, Oral, Every 6 hours PRN   Tresiba  FlexTouch 40 Units, Subcutaneous, Daily   witch hazel-glycerin  (TUCKS) pad Topical, As needed     VITALS:   Vitals:   12/16/23 1331  BP: 125/76  Pulse: 76  Resp: 20  SpO2: 98%  Weight: 147 lb (66.7 kg)      PHYSICAL EXAM   HEENT:  Normocephalic, atraumatic.  The superficial temporal arteries are without ropiness or tenderness. Cardiovascular: Regular rate and rhythm. Lungs: Clear to auscultation bilaterally. Neck: There are no carotid bruits noted bilaterally.  NEUROLOGICAL:     No data to display             12/17/2023    7:00 AM  MMSE - Mini Mental State Exam  Orientation to time 5  Orientation to Place 3  Registration 3  Attention/ Calculation 5  Recall 3  Language- name 2 objects 2  Language- repeat 1  Language- follow 3 step command 3  Language- read & follow direction 1  Write a sentence 1  Copy design 0  Total score 27     Orientation:  Alert and  oriented to person, place and time. No aphasia or dysarthria. Fund of knowledge is appropriate. Recent and remote memory normal.  Attention and concentration are normal  Able to name objects and repeat phrases.   Delayed recall  3/3 Cranial nerves: There is good facial symmetry. Flat affect. Extraocular muscles are intact and visual fields are full to confrontational testing. Speech is fluent and clear, very slow paced. No tongue deviation. Hearing is very decreased to conversational tone.  Tone: Tone is mildly increased on LUE. Mild cogwheeling L>R Abnormal movements:mild resting and intention tremor, L>RUE. No asterixis, no fasciculations Sensation: Sensation is intact to light touch.  Vibration is intact at the bilateral big toe.  Coordination: The patient has no difficulty with RAM's or FNF bilaterally. Normal finger to nose  Motor: Strength is 5/5 in the bilateral  upper and lower extremities. There is no pronator drift. There are no fasciculations noted. DTR's: Deep tendon reflexes are 2/4 bilaterally. Gait and Station: The patient is able to ambulate without difficulty. Gait is cautious and wide based . Stride length is short       Thank you for allowing us  the opportunity to participate in the care of this nice patient. Please do not hesitate to contact us  for any questions or concerns.   Total time spent on today's visit was 45 minutes dedicated to this patient today, preparing to see patient, examining the patient, ordering tests and/or medications and counseling the patient, documenting clinical information in the EHR or other health record, independently interpreting results and communicating results to the patient/family, discussing treatment and goals, answering patient's questions and coordinating care.  Cc:  Glena Landau, MD  Tex Filbert 12/17/2023 7:53 AM

## 2023-12-16 NOTE — Patient Instructions (Addendum)
 It was a pleasure to see you today at our office.   Recommendations:    MRI of the brain, the radiology office will call you to arrange you appointment   Check labs today  [address] Follow up in 3 months Check hearing     For psychiatric meds, mood meds: Please have your primary care physician manage these medications.  If you have any severe symptoms of a stroke, or other severe issues such as confusion,severe chills or fever, etc call 911 or go to the ER as you may need to be evaluated further   For guidance regarding [facility] and if placement were needed at the facility, contact Social Worker tel: [phone]  For assessment of decision of mental capacity and competency:  Call Dr. Laverne Potter, geriatric psychiatrist at [phone]  Counseling regarding caregiver distress, including caregiver depression, anxiety and issues regarding community resources, adult day care programs, adult living facilities, or memory care questions:  please contact your  Primary Doctor's Social Worker   Whom to call: Memory  decline, memory medications: Call our office [phone]    [email or web address]   RECOMMENDATIONS FOR ALL PATIENTS WITH MEMORY PROBLEMS: 1. Continue to exercise (Recommend 30 minutes of walking everyday, or 3 hours every week) 2. Increase social interactions - continue going to [employer] and enjoy social gatherings with friends and family 3. Eat healthy, avoid fried foods and eat more fruits and vegetables 4. Maintain adequate blood pressure, blood sugar, and blood cholesterol level. Reducing the risk of stroke and cardiovascular disease also helps promoting better memory. 5. Avoid stressful situations. Live a simple life and avoid aggravations. Organize your time and prepare for the next day in anticipation. 6. Sleep well, avoid any interruptions of sleep and avoid any distractions in the bedroom that  may interfere with adequate sleep quality 7. Avoid sugar, avoid sweets as there is a strong link between excessive sugar intake, diabetes, and cognitive impairment We discussed the Mediterranean diet, which has been shown to help patients reduce the risk of progressive memory disorders and reduces cardiovascular risk. This includes eating fish, eat fruits and green leafy vegetables, nuts like almonds and hazelnuts, walnuts, and also use olive oil. Avoid fast foods and fried foods as much as possible. Avoid sweets and sugar as sugar use has been linked to worsening of memory function.  There is always a concern of gradual progression of memory problems. If this is the case, then we may need to adjust level of care according to patient needs. Support, both to the patient and caregiver, should then be put into place.      You have been referred for a neuropsychological evaluation (i.e., evaluation of memory and thinking abilities). Please bring someone with you to this appointment if possible, as it is helpful for the doctor to hear from both you and another adult who knows you well. Please bring eyeglasses and hearing aids if you wear them.    The evaluation will take approximately 3 hours and has two parts:   The first part is a clinical interview with the neuropsychologist (Dr. Kitty Perkins or Dr. Donavon Fudge). During the interview, the neuropsychologist will speak with you and the individual you brought to the appointment.    The second part of the evaluation is testing with the doctor's technician Bernabe Brew or Burdette Carolin). During the testing, the technician will ask you to remember different types of material, solve problems, and answer some questionnaires. Your family member will not be present for  this portion of the evaluation.   Please note: We must reserve several hours of the neuropsychologist's time and the psychometrician's time for your evaluation appointment. As such, there is a No-Show fee of $100. If you are  unable to attend any of your appointments, please contact our office as soon as possible to reschedule.      DRIVING: Regarding driving, in patients with progressive memory problems, driving will be impaired. We advise to have someone else do the driving if trouble finding directions or if minor accidents are reported. Independent driving assessment is available to determine safety of driving.   If you are interested in the driving assessment, you can contact the following:  [facility] [employer] in [place] [phone]  [facility] [phone]  [facility] [phone]  [facility] [phone] or [phone]   FALL PRECAUTIONS: Be cautious when walking. Scan the area for obstacles that may increase the risk of trips and falls. When getting up in the mornings, sit up at the edge of the bed for a few minutes before getting out of bed. Consider elevating the bed at the head end to avoid drop of blood pressure when getting up. Walk always in a well-lit room (use night lights in the walls). Avoid area rugs or power cords from appliances in the middle of the walkways. Use a walker or a cane if necessary and consider physical therapy for balance exercise. Get your eyesight checked regularly.  FINANCIAL OVERSIGHT: Supervision, especially oversight when making financial decisions or transactions is also recommended.  HOME SAFETY: Consider the safety of the kitchen when operating appliances like stoves, microwave oven, and blender. Consider having supervision and share cooking responsibilities until no longer able to participate in those. Accidents with firearms and other hazards in the house should be identified and addressed as well.   ABILITY TO BE LEFT ALONE: If patient is unable to contact 911 operator, consider using LifeLine, or when the need is there, arrange for someone to stay with patients. Smoking is a fire hazard, consider supervision or  cessation. Risk of wandering should be assessed by caregiver and if detected at any point, supervision and safe proof recommendations should be instituted.  MEDICATION SUPERVISION: Inability to self-administer medication needs to be constantly addressed. Implement a mechanism to ensure safe administration of the medications.      Mediterranean Diet A Mediterranean diet refers to food and lifestyle choices that are based on the traditions of countries located on the [employer] [place]. This way of eating has been shown to help prevent certain conditions and improve outcomes for people who have chronic diseases, like kidney disease and heart disease. What are tips for following this plan? Lifestyle  Cook and eat meals together with your family, when possible. Drink enough fluid to keep your urine clear or pale yellow. Be physically active every day. This includes: Aerobic exercise like running or swimming. Leisure activities like gardening, walking, or housework. Get 7-8 hours of sleep each night. If recommended by your health care provider, drink red wine in moderation. This means 1 glass a day for nonpregnant women and 2 glasses a day for men. A glass of wine equals 5 oz (150 mL). Reading food labels  Check the serving size of packaged foods. For foods such as rice and pasta, the serving size refers to the amount of cooked product, not dry. Check the total fat in packaged foods. Avoid foods that have saturated fat or trans fats. Check the ingredients list for  added sugars, such as corn syrup. Shopping  At the grocery store, buy most of your food from the areas near the walls of the store. This includes: Fresh fruits and vegetables (produce). Grains, beans, nuts, and seeds. Some of these may be available in unpackaged forms or large amounts (in bulk). Fresh seafood. Poultry and eggs. Low-fat dairy products. Buy whole ingredients instead of prepackaged foods. Buy fresh fruits and  vegetables in-season from local farmers markets. Buy frozen fruits and vegetables in resealable bags. If you do not have access to quality fresh seafood, buy precooked frozen shrimp or canned fish, such as tuna, salmon, or sardines. Buy small amounts of raw or cooked vegetables, salads, or olives from the deli or salad bar at your store. Stock your pantry so you always have certain foods on hand, such as olive oil, canned tuna, canned tomatoes, rice, pasta, and beans. Cooking  Cook foods with extra-virgin olive oil instead of using butter or other vegetable oils. Have meat as a side dish, and have vegetables or grains as your main dish. This means having meat in small portions or adding small amounts of meat to foods like pasta or stew. Use beans or vegetables instead of meat in common dishes like chili or lasagna. Experiment with different cooking methods. Try roasting or broiling vegetables instead of steaming or sauteing them. Add frozen vegetables to soups, stews, pasta, or rice. Add nuts or seeds for added healthy fat at each meal. You can add these to yogurt, salads, or vegetable dishes. Marinate fish or vegetables using olive oil, lemon juice, garlic, and fresh herbs. Meal planning  Plan to eat 1 vegetarian meal one day each week. Try to work up to 2 vegetarian meals, if possible. Eat seafood 2 or more times a week. Have healthy snacks readily available, such as: Vegetable sticks with hummus. Greek yogurt. Fruit and nut trail mix. Eat balanced meals throughout the week. This includes: Fruit: 2-3 servings a day Vegetables: 4-5 servings a day Low-fat dairy: 2 servings a day Fish, poultry, or lean meat: 1 serving a day Beans and legumes: 2 or more servings a week Nuts and seeds: 1-2 servings a day Whole grains: 6-8 servings a day Extra-virgin olive oil: 3-4 servings a day Limit red meat and sweets to only a few servings a month What are my food choices? Mediterranean  diet Recommended Grains: Whole-grain pasta. Brown rice. Bulgar wheat. Polenta. Couscous. Whole-wheat bread. Dwyane Glad. Vegetables: Artichokes. Beets. Broccoli. Cabbage. Carrots. Eggplant. Green beans. Chard. Kale. Spinach. Onions. Leeks. Peas. Squash. Tomatoes. Peppers. Radishes. Fruits: Apples. Apricots. Avocado. Berries. Bananas. Cherries. Dates. Figs. Grapes. Lemons. Melon. Oranges. Peaches. Plums. Pomegranate. Meats and other protein foods: Beans. Almonds. Sunflower seeds. Pine nuts. Peanuts. Cod. Salmon. Scallops. Shrimp. Tuna. Tilapia. Clams. Oysters. Eggs. Dairy: Low-fat milk. Cheese. Greek yogurt. Beverages: Water. Red wine. Herbal tea. Fats and oils: Extra virgin olive oil. Avocado oil. Grape seed oil. Sweets and desserts: [place] yogurt with honey. Baked apples. Poached pears. Trail mix. Seasoning and other foods: Basil. Cilantro. Coriander. Cumin. Mint. Parsley. Sage. Rosemary. Tarragon. Garlic. Oregano. Thyme. Pepper. Balsalmic vinegar. Tahini. Hummus. Tomato sauce. Olives. Mushrooms. Limit these Grains: Prepackaged pasta or rice dishes. Prepackaged cereal with added sugar. Vegetables: Deep fried potatoes (french fries). Fruits: Fruit canned in syrup. Meats and other protein foods: Beef. Pork. Lamb. Poultry with skin. Hot dogs. Helene Loader. Dairy: Ice cream. Sour cream. Whole milk. Beverages: Juice. Sugar-sweetened soft drinks. Beer. Liquor and spirits. Fats and oils: Butter. Canola oil. Vegetable oil.  Beef fat (tallow). Lard. Sweets and desserts: Cookies. Cakes. Pies. Candy. Seasoning and other foods: Mayonnaise. Premade sauces and marinades. The items listed may not be a complete list. Talk with your dietitian about what dietary choices are right for you. Summary The Mediterranean diet includes both food and lifestyle choices. Eat a variety of fresh fruits and vegetables, beans, nuts, seeds, and whole grains. Limit the amount of red meat and sweets that you eat. Talk with your  health care provider about whether it is safe for you to drink red wine in moderation. This means 1 glass a day for nonpregnant women and 2 glasses a day for men. A glass of wine equals 5 oz (150 mL). This information is not intended to replace advice given to you by your health care provider. Make sure you discuss any questions you have with your health care provider. Document Released: 03/20/2016 Document Revised: 04/22/2016 Document Reviewed: 03/20/2016 Elsevier Interactive Patient Education  2017 [employer].

## 2023-12-17 LAB — TSH: TSH: 1.35 m[IU]/L (ref 0.40–4.50)

## 2023-12-17 LAB — VITAMIN B12: Vitamin B-12: 717 pg/mL (ref 200–1100)

## 2023-12-17 NOTE — Progress Notes (Signed)
 Voicemail, will call back at 10:42am

## 2023-12-18 ENCOUNTER — Encounter: Payer: Self-pay | Admitting: Physician Assistant

## 2023-12-23 ENCOUNTER — Ambulatory Visit
Admission: RE | Admit: 2023-12-23 | Discharge: 2023-12-23 | Disposition: A | Discharge status: Home / Self Care | Source: Ambulatory Visit | Attending: Physician Assistant | Admitting: Physician Assistant

## 2023-12-23 ENCOUNTER — Ambulatory Visit: Payer: Self-pay | Admitting: Physician Assistant

## 2023-12-23 DIAGNOSIS — G319 Degenerative disease of nervous system, unspecified: Secondary | ICD-10-CM | POA: Diagnosis not present

## 2023-12-23 DIAGNOSIS — R413 Other amnesia: Secondary | ICD-10-CM | POA: Diagnosis not present

## 2023-12-23 DIAGNOSIS — I6782 Cerebral ischemia: Secondary | ICD-10-CM | POA: Diagnosis not present

## 2023-12-24 DIAGNOSIS — I129 Hypertensive chronic kidney disease with stage 1 through stage 4 chronic kidney disease, or unspecified chronic kidney disease: Secondary | ICD-10-CM | POA: Diagnosis not present

## 2023-12-24 DIAGNOSIS — K766 Portal hypertension: Secondary | ICD-10-CM | POA: Diagnosis not present

## 2023-12-24 DIAGNOSIS — K7460 Unspecified cirrhosis of liver: Secondary | ICD-10-CM | POA: Diagnosis not present

## 2023-12-24 DIAGNOSIS — K7682 Hepatic encephalopathy: Secondary | ICD-10-CM | POA: Diagnosis not present

## 2023-12-24 DIAGNOSIS — E1169 Type 2 diabetes mellitus with other specified complication: Secondary | ICD-10-CM | POA: Diagnosis not present

## 2023-12-24 DIAGNOSIS — R188 Other ascites: Secondary | ICD-10-CM | POA: Diagnosis not present

## 2023-12-24 DIAGNOSIS — K7581 Nonalcoholic steatohepatitis (NASH): Secondary | ICD-10-CM | POA: Diagnosis not present

## 2023-12-24 DIAGNOSIS — E1122 Type 2 diabetes mellitus with diabetic chronic kidney disease: Secondary | ICD-10-CM | POA: Diagnosis not present

## 2023-12-24 DIAGNOSIS — N181 Chronic kidney disease, stage 1: Secondary | ICD-10-CM | POA: Diagnosis not present

## 2023-12-29 DIAGNOSIS — E1122 Type 2 diabetes mellitus with diabetic chronic kidney disease: Secondary | ICD-10-CM | POA: Diagnosis not present

## 2023-12-29 DIAGNOSIS — E1169 Type 2 diabetes mellitus with other specified complication: Secondary | ICD-10-CM | POA: Diagnosis not present

## 2023-12-29 DIAGNOSIS — K7682 Hepatic encephalopathy: Secondary | ICD-10-CM | POA: Diagnosis not present

## 2023-12-29 DIAGNOSIS — K7460 Unspecified cirrhosis of liver: Secondary | ICD-10-CM | POA: Diagnosis not present

## 2023-12-29 DIAGNOSIS — I129 Hypertensive chronic kidney disease with stage 1 through stage 4 chronic kidney disease, or unspecified chronic kidney disease: Secondary | ICD-10-CM | POA: Diagnosis not present

## 2023-12-29 DIAGNOSIS — N181 Chronic kidney disease, stage 1: Secondary | ICD-10-CM | POA: Diagnosis not present

## 2023-12-29 DIAGNOSIS — K766 Portal hypertension: Secondary | ICD-10-CM | POA: Diagnosis not present

## 2023-12-29 DIAGNOSIS — R188 Other ascites: Secondary | ICD-10-CM | POA: Diagnosis not present

## 2023-12-29 DIAGNOSIS — K7581 Nonalcoholic steatohepatitis (NASH): Secondary | ICD-10-CM | POA: Diagnosis not present

## 2023-12-31 DIAGNOSIS — I129 Hypertensive chronic kidney disease with stage 1 through stage 4 chronic kidney disease, or unspecified chronic kidney disease: Secondary | ICD-10-CM | POA: Diagnosis not present

## 2023-12-31 DIAGNOSIS — K7682 Hepatic encephalopathy: Secondary | ICD-10-CM | POA: Diagnosis not present

## 2023-12-31 DIAGNOSIS — K7460 Unspecified cirrhosis of liver: Secondary | ICD-10-CM | POA: Diagnosis not present

## 2023-12-31 DIAGNOSIS — K7581 Nonalcoholic steatohepatitis (NASH): Secondary | ICD-10-CM | POA: Diagnosis not present

## 2023-12-31 DIAGNOSIS — R188 Other ascites: Secondary | ICD-10-CM | POA: Diagnosis not present

## 2023-12-31 DIAGNOSIS — E1169 Type 2 diabetes mellitus with other specified complication: Secondary | ICD-10-CM | POA: Diagnosis not present

## 2023-12-31 DIAGNOSIS — E1122 Type 2 diabetes mellitus with diabetic chronic kidney disease: Secondary | ICD-10-CM | POA: Diagnosis not present

## 2023-12-31 DIAGNOSIS — N181 Chronic kidney disease, stage 1: Secondary | ICD-10-CM | POA: Diagnosis not present

## 2023-12-31 DIAGNOSIS — K766 Portal hypertension: Secondary | ICD-10-CM | POA: Diagnosis not present

## 2024-01-05 DIAGNOSIS — K7682 Hepatic encephalopathy: Secondary | ICD-10-CM | POA: Diagnosis not present

## 2024-01-05 DIAGNOSIS — K7581 Nonalcoholic steatohepatitis (NASH): Secondary | ICD-10-CM | POA: Diagnosis not present

## 2024-01-05 DIAGNOSIS — N181 Chronic kidney disease, stage 1: Secondary | ICD-10-CM | POA: Diagnosis not present

## 2024-01-05 DIAGNOSIS — I129 Hypertensive chronic kidney disease with stage 1 through stage 4 chronic kidney disease, or unspecified chronic kidney disease: Secondary | ICD-10-CM | POA: Diagnosis not present

## 2024-01-05 DIAGNOSIS — E1169 Type 2 diabetes mellitus with other specified complication: Secondary | ICD-10-CM | POA: Diagnosis not present

## 2024-01-05 DIAGNOSIS — K7460 Unspecified cirrhosis of liver: Secondary | ICD-10-CM | POA: Diagnosis not present

## 2024-01-05 DIAGNOSIS — K766 Portal hypertension: Secondary | ICD-10-CM | POA: Diagnosis not present

## 2024-01-05 DIAGNOSIS — R188 Other ascites: Secondary | ICD-10-CM | POA: Diagnosis not present

## 2024-01-05 DIAGNOSIS — E1122 Type 2 diabetes mellitus with diabetic chronic kidney disease: Secondary | ICD-10-CM | POA: Diagnosis not present

## 2024-01-08 DIAGNOSIS — E1122 Type 2 diabetes mellitus with diabetic chronic kidney disease: Secondary | ICD-10-CM | POA: Diagnosis not present

## 2024-01-08 DIAGNOSIS — N181 Chronic kidney disease, stage 1: Secondary | ICD-10-CM | POA: Diagnosis not present

## 2024-01-08 DIAGNOSIS — K7682 Hepatic encephalopathy: Secondary | ICD-10-CM | POA: Diagnosis not present

## 2024-01-08 DIAGNOSIS — R188 Other ascites: Secondary | ICD-10-CM | POA: Diagnosis not present

## 2024-01-08 DIAGNOSIS — K7581 Nonalcoholic steatohepatitis (NASH): Secondary | ICD-10-CM | POA: Diagnosis not present

## 2024-01-08 DIAGNOSIS — I129 Hypertensive chronic kidney disease with stage 1 through stage 4 chronic kidney disease, or unspecified chronic kidney disease: Secondary | ICD-10-CM | POA: Diagnosis not present

## 2024-01-08 DIAGNOSIS — K7460 Unspecified cirrhosis of liver: Secondary | ICD-10-CM | POA: Diagnosis not present

## 2024-01-08 DIAGNOSIS — K766 Portal hypertension: Secondary | ICD-10-CM | POA: Diagnosis not present

## 2024-01-08 DIAGNOSIS — E1169 Type 2 diabetes mellitus with other specified complication: Secondary | ICD-10-CM | POA: Diagnosis not present

## 2024-01-12 DIAGNOSIS — K7581 Nonalcoholic steatohepatitis (NASH): Secondary | ICD-10-CM | POA: Diagnosis not present

## 2024-01-12 DIAGNOSIS — I129 Hypertensive chronic kidney disease with stage 1 through stage 4 chronic kidney disease, or unspecified chronic kidney disease: Secondary | ICD-10-CM | POA: Diagnosis not present

## 2024-01-12 DIAGNOSIS — E1169 Type 2 diabetes mellitus with other specified complication: Secondary | ICD-10-CM | POA: Diagnosis not present

## 2024-01-12 DIAGNOSIS — K7460 Unspecified cirrhosis of liver: Secondary | ICD-10-CM | POA: Diagnosis not present

## 2024-01-12 DIAGNOSIS — R188 Other ascites: Secondary | ICD-10-CM | POA: Diagnosis not present

## 2024-01-12 DIAGNOSIS — E1122 Type 2 diabetes mellitus with diabetic chronic kidney disease: Secondary | ICD-10-CM | POA: Diagnosis not present

## 2024-01-12 DIAGNOSIS — K766 Portal hypertension: Secondary | ICD-10-CM | POA: Diagnosis not present

## 2024-01-12 DIAGNOSIS — K7682 Hepatic encephalopathy: Secondary | ICD-10-CM | POA: Diagnosis not present

## 2024-01-12 DIAGNOSIS — N181 Chronic kidney disease, stage 1: Secondary | ICD-10-CM | POA: Diagnosis not present

## 2024-01-18 DIAGNOSIS — N181 Chronic kidney disease, stage 1: Secondary | ICD-10-CM | POA: Diagnosis not present

## 2024-01-18 DIAGNOSIS — I129 Hypertensive chronic kidney disease with stage 1 through stage 4 chronic kidney disease, or unspecified chronic kidney disease: Secondary | ICD-10-CM | POA: Diagnosis not present

## 2024-01-18 DIAGNOSIS — K7460 Unspecified cirrhosis of liver: Secondary | ICD-10-CM | POA: Diagnosis not present

## 2024-01-18 DIAGNOSIS — K7581 Nonalcoholic steatohepatitis (NASH): Secondary | ICD-10-CM | POA: Diagnosis not present

## 2024-01-18 DIAGNOSIS — E1122 Type 2 diabetes mellitus with diabetic chronic kidney disease: Secondary | ICD-10-CM | POA: Diagnosis not present

## 2024-01-18 DIAGNOSIS — E1169 Type 2 diabetes mellitus with other specified complication: Secondary | ICD-10-CM | POA: Diagnosis not present

## 2024-01-18 DIAGNOSIS — K7682 Hepatic encephalopathy: Secondary | ICD-10-CM | POA: Diagnosis not present

## 2024-01-18 DIAGNOSIS — K766 Portal hypertension: Secondary | ICD-10-CM | POA: Diagnosis not present

## 2024-01-18 DIAGNOSIS — R188 Other ascites: Secondary | ICD-10-CM | POA: Diagnosis not present

## 2024-01-25 DIAGNOSIS — K7682 Hepatic encephalopathy: Secondary | ICD-10-CM | POA: Diagnosis not present

## 2024-01-25 DIAGNOSIS — K7460 Unspecified cirrhosis of liver: Secondary | ICD-10-CM | POA: Diagnosis not present

## 2024-01-25 DIAGNOSIS — E1169 Type 2 diabetes mellitus with other specified complication: Secondary | ICD-10-CM | POA: Diagnosis not present

## 2024-01-25 DIAGNOSIS — I129 Hypertensive chronic kidney disease with stage 1 through stage 4 chronic kidney disease, or unspecified chronic kidney disease: Secondary | ICD-10-CM | POA: Diagnosis not present

## 2024-01-25 DIAGNOSIS — K766 Portal hypertension: Secondary | ICD-10-CM | POA: Diagnosis not present

## 2024-01-25 DIAGNOSIS — E1122 Type 2 diabetes mellitus with diabetic chronic kidney disease: Secondary | ICD-10-CM | POA: Diagnosis not present

## 2024-01-25 DIAGNOSIS — R188 Other ascites: Secondary | ICD-10-CM | POA: Diagnosis not present

## 2024-01-25 DIAGNOSIS — N181 Chronic kidney disease, stage 1: Secondary | ICD-10-CM | POA: Diagnosis not present

## 2024-01-25 DIAGNOSIS — K7581 Nonalcoholic steatohepatitis (NASH): Secondary | ICD-10-CM | POA: Diagnosis not present

## 2024-02-05 DIAGNOSIS — K7581 Nonalcoholic steatohepatitis (NASH): Secondary | ICD-10-CM | POA: Diagnosis not present

## 2024-02-05 DIAGNOSIS — R188 Other ascites: Secondary | ICD-10-CM | POA: Diagnosis not present

## 2024-02-05 DIAGNOSIS — K7682 Hepatic encephalopathy: Secondary | ICD-10-CM | POA: Diagnosis not present

## 2024-02-05 DIAGNOSIS — E1122 Type 2 diabetes mellitus with diabetic chronic kidney disease: Secondary | ICD-10-CM | POA: Diagnosis not present

## 2024-02-05 DIAGNOSIS — K7460 Unspecified cirrhosis of liver: Secondary | ICD-10-CM | POA: Diagnosis not present

## 2024-02-05 DIAGNOSIS — K766 Portal hypertension: Secondary | ICD-10-CM | POA: Diagnosis not present

## 2024-02-05 DIAGNOSIS — N181 Chronic kidney disease, stage 1: Secondary | ICD-10-CM | POA: Diagnosis not present

## 2024-02-05 DIAGNOSIS — I129 Hypertensive chronic kidney disease with stage 1 through stage 4 chronic kidney disease, or unspecified chronic kidney disease: Secondary | ICD-10-CM | POA: Diagnosis not present

## 2024-02-05 DIAGNOSIS — E1169 Type 2 diabetes mellitus with other specified complication: Secondary | ICD-10-CM | POA: Diagnosis not present

## 2024-02-08 DIAGNOSIS — K7581 Nonalcoholic steatohepatitis (NASH): Secondary | ICD-10-CM | POA: Diagnosis not present

## 2024-02-08 DIAGNOSIS — K7460 Unspecified cirrhosis of liver: Secondary | ICD-10-CM | POA: Diagnosis not present

## 2024-02-08 DIAGNOSIS — R188 Other ascites: Secondary | ICD-10-CM | POA: Diagnosis not present

## 2024-02-08 DIAGNOSIS — E1169 Type 2 diabetes mellitus with other specified complication: Secondary | ICD-10-CM | POA: Diagnosis not present

## 2024-02-08 DIAGNOSIS — I129 Hypertensive chronic kidney disease with stage 1 through stage 4 chronic kidney disease, or unspecified chronic kidney disease: Secondary | ICD-10-CM | POA: Diagnosis not present

## 2024-02-08 DIAGNOSIS — N181 Chronic kidney disease, stage 1: Secondary | ICD-10-CM | POA: Diagnosis not present

## 2024-02-08 DIAGNOSIS — K766 Portal hypertension: Secondary | ICD-10-CM | POA: Diagnosis not present

## 2024-02-08 DIAGNOSIS — K7682 Hepatic encephalopathy: Secondary | ICD-10-CM | POA: Diagnosis not present

## 2024-02-08 DIAGNOSIS — E1122 Type 2 diabetes mellitus with diabetic chronic kidney disease: Secondary | ICD-10-CM | POA: Diagnosis not present

## 2024-02-16 DIAGNOSIS — I129 Hypertensive chronic kidney disease with stage 1 through stage 4 chronic kidney disease, or unspecified chronic kidney disease: Secondary | ICD-10-CM | POA: Diagnosis not present

## 2024-02-16 DIAGNOSIS — K7460 Unspecified cirrhosis of liver: Secondary | ICD-10-CM | POA: Diagnosis not present

## 2024-02-16 DIAGNOSIS — E1169 Type 2 diabetes mellitus with other specified complication: Secondary | ICD-10-CM | POA: Diagnosis not present

## 2024-02-16 DIAGNOSIS — K7581 Nonalcoholic steatohepatitis (NASH): Secondary | ICD-10-CM | POA: Diagnosis not present

## 2024-02-16 DIAGNOSIS — N181 Chronic kidney disease, stage 1: Secondary | ICD-10-CM | POA: Diagnosis not present

## 2024-02-16 DIAGNOSIS — E1122 Type 2 diabetes mellitus with diabetic chronic kidney disease: Secondary | ICD-10-CM | POA: Diagnosis not present

## 2024-02-16 DIAGNOSIS — K7682 Hepatic encephalopathy: Secondary | ICD-10-CM | POA: Diagnosis not present

## 2024-02-16 DIAGNOSIS — R188 Other ascites: Secondary | ICD-10-CM | POA: Diagnosis not present

## 2024-02-16 DIAGNOSIS — K766 Portal hypertension: Secondary | ICD-10-CM | POA: Diagnosis not present

## 2024-03-08 DIAGNOSIS — K7460 Unspecified cirrhosis of liver: Secondary | ICD-10-CM | POA: Diagnosis not present

## 2024-03-08 DIAGNOSIS — E440 Moderate protein-calorie malnutrition: Secondary | ICD-10-CM | POA: Diagnosis not present

## 2024-03-08 DIAGNOSIS — K7682 Hepatic encephalopathy: Secondary | ICD-10-CM | POA: Diagnosis not present

## 2024-03-08 DIAGNOSIS — K7581 Nonalcoholic steatohepatitis (NASH): Secondary | ICD-10-CM | POA: Diagnosis not present

## 2024-04-01 DIAGNOSIS — H9201 Otalgia, right ear: Secondary | ICD-10-CM | POA: Diagnosis not present

## 2024-04-08 ENCOUNTER — Ambulatory Visit: Admitting: Physician Assistant

## 2024-04-21 DIAGNOSIS — L570 Actinic keratosis: Secondary | ICD-10-CM | POA: Diagnosis not present

## 2024-04-21 DIAGNOSIS — D1801 Hemangioma of skin and subcutaneous tissue: Secondary | ICD-10-CM | POA: Diagnosis not present

## 2024-04-21 DIAGNOSIS — L821 Other seborrheic keratosis: Secondary | ICD-10-CM | POA: Diagnosis not present

## 2024-04-21 DIAGNOSIS — D692 Other nonthrombocytopenic purpura: Secondary | ICD-10-CM | POA: Diagnosis not present

## 2024-05-17 DIAGNOSIS — Z23 Encounter for immunization: Secondary | ICD-10-CM | POA: Diagnosis not present

## 2024-05-17 DIAGNOSIS — Z0000 Encounter for general adult medical examination without abnormal findings: Secondary | ICD-10-CM | POA: Diagnosis not present

## 2024-05-17 DIAGNOSIS — I519 Heart disease, unspecified: Secondary | ICD-10-CM | POA: Diagnosis not present

## 2024-05-17 DIAGNOSIS — E782 Mixed hyperlipidemia: Secondary | ICD-10-CM | POA: Diagnosis not present

## 2024-05-17 DIAGNOSIS — N181 Chronic kidney disease, stage 1: Secondary | ICD-10-CM | POA: Diagnosis not present

## 2024-05-17 DIAGNOSIS — Z1331 Encounter for screening for depression: Secondary | ICD-10-CM | POA: Diagnosis not present

## 2024-05-17 DIAGNOSIS — I129 Hypertensive chronic kidney disease with stage 1 through stage 4 chronic kidney disease, or unspecified chronic kidney disease: Secondary | ICD-10-CM | POA: Diagnosis not present

## 2024-05-17 DIAGNOSIS — E1122 Type 2 diabetes mellitus with diabetic chronic kidney disease: Secondary | ICD-10-CM | POA: Diagnosis not present

## 2024-05-17 DIAGNOSIS — I350 Nonrheumatic aortic (valve) stenosis: Secondary | ICD-10-CM | POA: Diagnosis not present

## 2024-05-17 DIAGNOSIS — K7460 Unspecified cirrhosis of liver: Secondary | ICD-10-CM | POA: Diagnosis not present

## 2024-06-13 DIAGNOSIS — M810 Age-related osteoporosis without current pathological fracture: Secondary | ICD-10-CM | POA: Diagnosis not present

## 2024-07-26 ENCOUNTER — Other Ambulatory Visit (HOSPITAL_BASED_OUTPATIENT_CLINIC_OR_DEPARTMENT_OTHER): Payer: Self-pay | Admitting: Nurse Practitioner

## 2024-07-26 DIAGNOSIS — K7581 Nonalcoholic steatohepatitis (NASH): Secondary | ICD-10-CM

## 2024-08-09 ENCOUNTER — Other Ambulatory Visit: Payer: Self-pay | Admitting: Nurse Practitioner

## 2024-08-09 DIAGNOSIS — K7581 Nonalcoholic steatohepatitis (NASH): Secondary | ICD-10-CM

## 2024-08-23 ENCOUNTER — Ambulatory Visit
Admission: RE | Admit: 2024-08-23 | Discharge: 2024-08-23 | Disposition: A | Source: Ambulatory Visit | Attending: Nurse Practitioner

## 2024-08-23 DIAGNOSIS — K7581 Nonalcoholic steatohepatitis (NASH): Secondary | ICD-10-CM
